# Patient Record
Sex: Male | Born: 1940 | Race: White | Hispanic: No | Marital: Single | Smoking: Former smoker
Health system: Southern US, Community
[De-identification: ages and names within clinical notes are randomized; demographics above are authoritative.]

## PROBLEM LIST (undated history)

## (undated) DIAGNOSIS — M549 Dorsalgia, unspecified: Secondary | ICD-10-CM

## (undated) DIAGNOSIS — M109 Gout, unspecified: Secondary | ICD-10-CM

## (undated) DIAGNOSIS — I4891 Unspecified atrial fibrillation: Secondary | ICD-10-CM

## (undated) DIAGNOSIS — I447 Left bundle-branch block, unspecified: Secondary | ICD-10-CM

## (undated) DIAGNOSIS — N183 Chronic kidney disease, stage 3 (moderate): Secondary | ICD-10-CM

## (undated) DIAGNOSIS — I2510 Atherosclerotic heart disease of native coronary artery without angina pectoris: Secondary | ICD-10-CM

## (undated) DIAGNOSIS — I10 Essential (primary) hypertension: Secondary | ICD-10-CM

## (undated) DIAGNOSIS — R0600 Dyspnea, unspecified: Secondary | ICD-10-CM

## (undated) DIAGNOSIS — S42309A Unspecified fracture of shaft of humerus, unspecified arm, initial encounter for closed fracture: Secondary | ICD-10-CM

## (undated) DIAGNOSIS — G8929 Other chronic pain: Secondary | ICD-10-CM

## (undated) DIAGNOSIS — Z95810 Presence of automatic (implantable) cardiac defibrillator: Secondary | ICD-10-CM

## (undated) DIAGNOSIS — I509 Heart failure, unspecified: Secondary | ICD-10-CM

## (undated) DIAGNOSIS — D696 Thrombocytopenia, unspecified: Secondary | ICD-10-CM

## (undated) DIAGNOSIS — I219 Acute myocardial infarction, unspecified: Secondary | ICD-10-CM

## (undated) DIAGNOSIS — F419 Anxiety disorder, unspecified: Secondary | ICD-10-CM

## (undated) DIAGNOSIS — I5022 Chronic systolic (congestive) heart failure: Secondary | ICD-10-CM

## (undated) HISTORY — DX: Presence of automatic (implantable) cardiac defibrillator: Z95.810

## (undated) HISTORY — DX: Anxiety disorder, unspecified: F41.9

## (undated) HISTORY — DX: Left bundle-branch block, unspecified: I44.7

## (undated) HISTORY — DX: Essential (primary) hypertension: I10

## (undated) HISTORY — DX: Dorsalgia, unspecified: M54.9

## (undated) HISTORY — DX: Chronic kidney disease, stage 3 (moderate): N18.3

## (undated) HISTORY — DX: Other chronic pain: G89.29

## (undated) HISTORY — DX: Unspecified atrial fibrillation: I48.91

## (undated) HISTORY — DX: Thrombocytopenia, unspecified: D69.6

## (undated) HISTORY — DX: Chronic systolic (congestive) heart failure: I50.22

## (undated) HISTORY — DX: Gout, unspecified: M10.9

## (undated) HISTORY — PX: OTHER SURGICAL HISTORY: SHX169

---

## 2006-03-10 ENCOUNTER — Emergency Department: Payer: Self-pay | Admitting: Emergency Medicine

## 2007-09-26 DIAGNOSIS — N183 Chronic kidney disease, stage 3 unspecified: Secondary | ICD-10-CM

## 2007-09-26 HISTORY — DX: Chronic kidney disease, stage 3 unspecified: N18.30

## 2007-12-26 ENCOUNTER — Inpatient Hospital Stay (HOSPITAL_COMMUNITY): Admission: AD | Admit: 2007-12-26 | Discharge: 2007-12-30 | Payer: Self-pay | Admitting: Internal Medicine

## 2007-12-26 ENCOUNTER — Ambulatory Visit: Payer: Self-pay | Admitting: Internal Medicine

## 2008-01-07 ENCOUNTER — Ambulatory Visit: Payer: Self-pay

## 2008-01-26 ENCOUNTER — Emergency Department: Payer: Self-pay | Admitting: Internal Medicine

## 2008-02-27 ENCOUNTER — Encounter: Payer: Self-pay | Admitting: General Practice

## 2008-03-25 ENCOUNTER — Encounter: Payer: Self-pay | Admitting: General Practice

## 2008-04-06 ENCOUNTER — Ambulatory Visit: Payer: Self-pay | Admitting: Internal Medicine

## 2009-03-04 DIAGNOSIS — Z95810 Presence of automatic (implantable) cardiac defibrillator: Secondary | ICD-10-CM | POA: Insufficient documentation

## 2009-03-04 DIAGNOSIS — I447 Left bundle-branch block, unspecified: Secondary | ICD-10-CM | POA: Insufficient documentation

## 2009-03-04 DIAGNOSIS — I5022 Chronic systolic (congestive) heart failure: Secondary | ICD-10-CM | POA: Insufficient documentation

## 2009-03-04 DIAGNOSIS — I4891 Unspecified atrial fibrillation: Secondary | ICD-10-CM

## 2010-06-20 ENCOUNTER — Encounter: Payer: Self-pay | Admitting: Internal Medicine

## 2010-06-28 ENCOUNTER — Ambulatory Visit: Payer: Self-pay | Admitting: Internal Medicine

## 2010-06-28 ENCOUNTER — Encounter: Payer: Self-pay | Admitting: Internal Medicine

## 2010-07-01 ENCOUNTER — Encounter: Payer: Self-pay | Admitting: Internal Medicine

## 2010-09-29 ENCOUNTER — Ambulatory Visit: Admit: 2010-09-29 | Payer: Self-pay | Admitting: Internal Medicine

## 2010-10-04 ENCOUNTER — Encounter (INDEPENDENT_AMBULATORY_CARE_PROVIDER_SITE_OTHER): Payer: Self-pay | Admitting: *Deleted

## 2010-10-25 NOTE — Cardiovascular Report (Signed)
Summary: ICD Follow-Up Summary  ICD Follow-Up Summary   Imported By: Roderic Ovens 07/11/2010 15:08:12  _____________________________________________________________________  External Attachment:    Type:   Image     Comment:   External Document

## 2010-10-25 NOTE — Assessment & Plan Note (Signed)
Summary: ROV/AMD   Visit Type:  Follow-up Primary Provider:  Dr. Fransico Him  CC:  "Denies chest pain or shortness of breath..  History of Present Illness: [name] is seen in followup for a CRT-D implanted in 2009 for congestive heart failure in the setting of ischemic heart disease. He has not been seen since shortly after his device was implanted. He has permanent atrial fibrillation. He is chronically high left ventricular pacing thresholds.  He's had multiple intercurrent medical issues but functionally he had been relatively stable. He denies chest pain. His shortness of breath is stable. He has peripheral edema.  Review of his diet is that he is quite labile and his sodium intake as part of his foods but does not add.      Preventive Screening-Counseling & Management  Alcohol-Tobacco     Smoking Status: quit  Caffeine-Diet-Exercise     Does Patient Exercise: no  Current Medications (verified): 1)  Carvedilol 25 Mg Tabs (Carvedilol) .... Take One Tablet By Mouth Twice A Day 2)  Pravastatin Sodium 80 Mg Tabs (Pravastatin Sodium) .... One Tablet At Bedtime 3)  Clonazepam 1 Mg Tabs (Clonazepam) .... One Tablet Two Times A Day 4)  Furosemide 40 Mg Tabs (Furosemide) .... Take Two Tablet in The A.m. and One Tablet Extra As Directed 5)  Hydroxyzine Hcl 25 Mg Tabs (Hydroxyzine Hcl) .... One Tablet Every 8 Hours As Needed 6)  Lisinopril 40 Mg Tabs (Lisinopril) .... One Tablet Once Daily 7)  Allopurinol 100 Mg Tabs (Allopurinol) .... One Tablet Once Daily 8)  Warfarin Sodium 5 Mg Tabs (Warfarin Sodium) .... Take As Directed 9)  Potassium Cl Er 20 Meq .... 1 To 1 1/2 Tablets Daily With Furosemide 10)  Amlodipine Besylate 5 Mg Tabs (Amlodipine Besylate) .... One Tablet Once Daily 11)  Citalopram Hydrobromide 40 Mg Tabs (Citalopram Hydrobromide) .... One Tablet Once Daily  Allergies (verified): No Known Drug Allergies  Past History:  Past Medical History: Last updated:  03/04/2009 ICD - IN SITU (ICD-V45.02) SYSTOLIC HEART FAILURE, CHRONIC (ICD-428.22) LBBB (ICD-426.3) ATRIAL FIBRILLATION (ICD-427.31) RENAL INSUFFICIENCY (ICD-588.9)    Past Surgical History: Last updated: 03/04/2009 ICD -  St. Jude Durata - 12/27/07 - CHF, Afib, LBBB, 3 vessel disease  Social History: Last updated: 06/28/2010 Retired  Single  Tobacco Use - Former. Quit 15 years ago. Alcohol Use - yes Regular Exercise - no  Risk Factors: Exercise: no (06/28/2010)  Risk Factors: Smoking Status: quit (06/28/2010)  Social History: Retired  Single  Tobacco Use - Former. Quit 15 years ago. Alcohol Use - yes Regular Exercise - no Smoking Status:  quit Does Patient Exercise:  no  Vital Signs:  Patient profile:   70 year old male Height:      70 inches Weight:      262 pounds BMI:     37.73 Pulse rate:   71 / minute BP sitting:   118 / 79  (right arm) Cuff size:   large  Vitals Entered By: Bishop Dublin, CMA (June 28, 2010 11:39 AM)  Physical Exam  General:  The patient was alert and oriented in no acute distress. HEENT Normal with ptosis and blinking of his right eye  Neck veins were flat, carotids were brisk.  Lungs were clear.  Heart sounds were regular without murmurs or gallops.  Abdomen was soft protuberant with active bowel sounds. There is no clubbing cyanosis 2+ edema Skin Warm and dry     ICD Specifications Following MD:  Sherryl Manges,  MD     ICD Vendor:  St Jude     ICD Model Number:  [id number]     ICD Serial Number:  [id number] ICD DOI:  12/27/2007     ICD Implanting MD:  Sherryl Manges, MD  Lead 1:    Location: RA     DOI: 12/27/2007     Model #: [id number]     Serial #: [id number]     Status: active Lead 2:    Location: RV     DOI: 12/27/2007     Model #: 1914     Serial #: [id number]     Status: active Lead 3:    Location: LV     DOI: 12/27/2007     Model #: 3086     Serial #: [id number]     Status: active  Indications::  CM   Impression &  Recommendations:  Problem # 1:  ICD - IN SITU (ICD-V45.02) Device parameters and data were reviewed and no changes were made  Problem # 2:  SYSTOLIC HEART FAILURE, CHRONIC (ICD-428.22) stable on current medications; he has significant peripheral edema. I suggested that we stop his amlodipine. Apparently however, his nephrologist has told him that amlodipine is being used for "renal protection" I will have to learn something about that. For now we'll continue it. He is advised to decrease his sodium intake. His updated medication list for this problem includes:    Carvedilol 25 Mg Tabs (Carvedilol) .Marland Kitchen... Take one tablet by mouth twice a day    Furosemide 40 Mg Tabs (Furosemide) .Marland Kitchen... Take two tablet in the a.m. and one tablet extra as directed    Lisinopril 40 Mg Tabs (Lisinopril) ..... One tablet once daily    Warfarin Sodium 5 Mg Tabs (Warfarin sodium) .Marland Kitchen... Take as directed    Amlodipine Besylate 5 Mg Tabs (Amlodipine besylate) ..... One tablet once daily  Problem # 3:  LBBB (ICD-426.3) stable His updated medication list for this problem includes:    Carvedilol 25 Mg Tabs (Carvedilol) .Marland Kitchen... Take one tablet by mouth twice a day    Lisinopril 40 Mg Tabs (Lisinopril) ..... One tablet once daily    Warfarin Sodium 5 Mg Tabs (Warfarin sodium) .Marland Kitchen... Take as directed    Amlodipine Besylate 5 Mg Tabs (Amlodipine besylate) ..... One tablet once daily  Problem # 4:  ATRIAL FIBRILLATION (ICD-427.31) permanent on Coumadin His updated medication list for this problem includes:    Carvedilol 25 Mg Tabs (Carvedilol) .Marland Kitchen... Take one tablet by mouth twice a day    Warfarin Sodium 5 Mg Tabs (Warfarin sodium) .Marland Kitchen... Take as directed

## 2010-10-25 NOTE — Miscellaneous (Signed)
Summary: Device preload  Clinical Lists Changes  Observations: Added new observation of ICD INDICATN: CM (06/20/2010 15:09) Added new observation of ICDLEADSTAT3: active (06/20/2010 15:09) Added new observation of ICDLEADSER3: [id number] V (06/20/2010 15:09) Added new observation of ICDLEADMOD3: 4193  (06/20/2010 15:09) Added new observation of ICDLEADDOI3: 12/27/2007  (06/20/2010 15:09) Added new observation of ICDLEADLOC3: LV  (06/20/2010 15:09) Added new observation of ICDLEADSTAT2: active  (06/20/2010 15:09) Added new observation of ICDLEADSER2: [id number]  (06/20/2010 15:09) Added new observation of ICDLEADMOD2: 7121  (06/20/2010 15:09) Added new observation of ICDLEADDOI2: 12/27/2007  (06/20/2010 15:09) Added new observation of ICDLEADLOC2: RV  (06/20/2010 15:09) Added new observation of ICDLEADSTAT1: active  (06/20/2010 15:09) Added new observation of ICDLEADSER1: [id number]  (06/20/2010 15:09) Added new observation of ICDLEADMOD1: 1688TC  (06/20/2010 15:09) Added new observation of ICDLEADDOI1: 12/27/2007  (06/20/2010 15:09) Added new observation of ICDLEADLOC1: RA  (06/20/2010 15:09) Added new observation of ICD IMP MD: Sherryl Manges, MD  (06/20/2010 15:09) Added new observation of ICD IMPL DTE: 12/27/2007  (06/20/2010 15:09) Added new observation of ICD SERL#: [id number]  (06/20/2010 15:09) Added new observation of ICD MODL#: [id number]  (06/20/2010 29:52) Added new observation of ICDMANUFACTR: St Jude  (06/20/2010 15:09) Added new observation of ICD MD: Sherryl Manges, MD  (06/20/2010 15:09)       ICD Specifications Following MD:  Sherryl Manges, MD     ICD Vendor:  St Jude     ICD Model Number:  [id number]     ICD Serial Number:  [id number] ICD DOI:  12/27/2007     ICD Implanting MD:  Sherryl Manges, MD  Lead 1:    Location: RA     DOI: 12/27/2007     Model #: 1688TC     Serial #: [id number]     Status: active Lead 2:    Location: RV     DOI: 12/27/2007     Model #: 3474     Serial #: [id number]      Status: active Lead 3:    Location: LV     DOI: 12/27/2007     Model #: 5643     Serial #: [id number]     Status: active  Indications::  CM

## 2010-10-25 NOTE — Procedures (Signed)
Summary: Cardiology Device Clinic   Allergies: No Known Drug Allergies   ICD Specifications Following MD:  Sherryl Manges, MD     ICD Vendor:  St Jude     ICD Model Number:  [phone]     ICD Serial Number:  [id number] ICD DOI:  12/27/2007     ICD Implanting MD:  Sherryl Manges, MD  Lead 1:    Location: RA     DOI: 12/27/2007     Model #: 1688TC     Serial #: [id number]     Status: active Lead 2:    Location: RV     DOI: 12/27/2007     Model #: 9371     Serial #: [id number]     Status: active Lead 3:    Location: LV     DOI: 12/27/2007     Model #: 1017     Serial #: [id number]     Status: active  Indications::  CM   ICD Follow Up Remote Check?  No Battery Voltage:  2.57 V     Charge Time:  14.4 seconds     Underlying rhythm:  A-fib   ICD Device Measurements Atrium:  Amplitude: 3.4 mV, Impedance: 630 ohms,  Right Ventricle:  Amplitude: 11.7 mV, Impedance: 460 ohms, Threshold: 1.0 V at 0.5 msec Left Ventricle:  Impedance: 890 ohms, Threshold: 5.0 V at 1.5 msec Shock Impedance: 48 ohms   Episodes Coumadin:  Yes Atrial Pacing:  <1%     Ventricular Pacing:  90%  Brady Parameters Mode DDDR     Lower Rate Limit:  60     Upper Rate Limit 120 PAV 180     Sensed AV Delay:  150  Tachy Zones VF:  240     VT:  210     VT1:  176     Next Remote Date:  09/29/2010     Next Cardiology Appt Due:  06/26/2011 Tech Comments:  A-fib, + coumadin.  LV reprogrammed 6.0@1 .0.  Merlin transmissions every 3 months. ROV 1 year with Dr. Graciela Husbands in [place].  Checked by [employer]. Altha Harm, LPN  July 01, 2010 11:04 AM

## 2010-10-27 NOTE — Letter (Signed)
Summary: Device-Delinquent Phone Journalist, newspaper, Main Office  [address]   [place], [place] [address]   Phone: [phone]  Fax: [phone]     October 04, 2010 MRN: [id number]   [facility] [address] [place], [place]  [address]   Dear Mr. Debarge,  According to our records, you were scheduled for a device phone transmission on 09-29-2010.     We did not receive any results from this check.  If you transmitted on your scheduled day, please call us to help troubleshoot your system.  If you forgot to send your transmission, please send one upon receipt of this letter.  Thank you,   Architectural technologist Device Clinic

## 2010-12-01 ENCOUNTER — Encounter (INDEPENDENT_AMBULATORY_CARE_PROVIDER_SITE_OTHER): Payer: Self-pay | Admitting: *Deleted

## 2010-12-06 NOTE — Letter (Signed)
Summary: Device-Delinquent Check  [facility], Main Office  [address]   [place], [place] [address]   Phone: [phone]  Fax: [phone]     December 01, 2010 MRN: [id number]   [facility] [address] [place], [place]  [address]   Dear Micheal [name],  According to our records, you have not had your implanted device checked in the recommended period of time.  We are unable to determine appropriate device function without checking your device on a regular basis.  Please call our office to schedule an appointment as soon as possible.  If you are having your device checked by another physician, please call us so that we may update our records.  Thank you,  Letta Moynahan, EMT  December 01, 2010 2:13 PM  [facility] Device [facility]

## 2011-02-07 NOTE — Letter (Signed)
April 06, 2008    Lamar Blinks, M.D.  [facility] (Main)  [address]  [place], [place] [address]   RE:  [name], Micheal  MRN:  [id number]  /  DOB:  [birth year]   Dear Smitty Cords:   Mr. Bunyard comes in today with his daughter.  His breathing is much  better since you considered referring for CRT-D implantation that was  successfully accomplished.   He has had some problems since he got pulled over by his dog and  dislocated his shoulder, but he is gradually getting better in this  regard as well.   MEDICATIONS:  1. Coreg 25 b.i.d.  2. Lisinopril 20.  3. Furosemide 80.  4. Aspirin.  5. Potassium.  6. Coumadin.  7. Celexa.  8. Metformin 500.   PHYSICAL EXAMINATION:  VITAL SIGNS:  Today on examination, his blood  pressure 136/78, his pulse 85.  His weight was 226.  NECK:  His neck veins were flat.  LUNGS:  Clear.  HEART:  Sounds were regular.  EXTREMITIES:  Had 1+ edema.   Interrogation of his St. Jude ICD demonstrates that he is in atrial  fibrillation with an R-wave of 11.4, impedance of 480 and threshold  point 0.5 at 0.5.  The LV impedance of 730, the threshold of 5 at 1.  He  is 98% ventricularly paced.   IMPRESSION:  1. Coronary artery disease.      a.     Depressed left ventricular function.      b.     Inoperable three-vessel disease.  2. Chronic systolic heart failure - improved.  3. Status post CRT-D for the above and contributing.  4. High left ventricular threshold - stable.  5. Atrial fibrillation - permanent.   Mr. Kipp, Shank is doing really very well.  We will plan to keep him  on his current medications.  He will see you again as previously  scheduled and we will see him in 3 months at which time we will decide  whether to put him on a remote followup or not.    Sincerely,      Duke Salvia, MD, [facility]  Electronically Signed    SCK/MedQ  DD: 04/06/2008  DT: 04/07/2008  Job #: [id number]

## 2011-02-07 NOTE — Discharge Summary (Signed)
Micheal [name], Micheal [name]               ACCOUNT NO.:  [id number]   MEDICAL RECORD NO.:  [id number]          PATIENT TYPE:  INP   LOCATION:  3707                         FACILITY:  [facility]   PHYSICIAN:  Maple Mirza, PA   DATE OF BIRTH:  05/11/1941   DATE OF ADMISSION:  12/26/2007  DATE OF DISCHARGE:                               DISCHARGE SUMMARY   ALLERGIES:  NO KNOWN DRUG ALLERGIES.   DICTATION AND EXAMINATION TIME:  Greater than 40 minutes.   FINAL DIAGNOSES:  1. Discharging day three status post implant of a St. Jude Promote      cardioverter-defibrillator with left ventricular cardiac      resynchronization lead.  2. Admitted [facility] with progressive dyspnea.  3. Left heart catheterization December 26, 2007, with ejection fraction      15% Dr. Gwen Pounds.  4. Atrial fibrillation/Coumadin therapy, new diagnosis this admission.  5. New York Heart Association classroom  6. Chronic systolic congestive heart failure symptoms.  7. Left bundle branch block.  8. Renal insufficiency with a zenith creatinine of 2.06 on April 5      after aggressive diuretic push, discharging with creatinine 1.76 on      April 6.  9. Weight lost this admission, 11 pounds.  Admission weight 239.      Discharge weight to 228.   SECONDARY DIAGNOSES:  1. Left heart catheterization December 26, 2007 ejection fraction 15%.      Three-vessel coronary artery disease.  The LAD has a poor 100%      proximal stenosis.  The first diagonal has 80% stenosis of the      first obtuse marginal has diffuse 75% stenosis.  The second obtuse      marginal diffuse 90% stenosis.  The PDA is 100% occluded.  The mid      right coronary artery only 30% occluded at that procedure PCI was      not attempted, and the patient is a very possible high-risk patient      for coronary artery bypass graft surgery.  2. Hypertension.  3. 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  4. Anxiety/depression.   BRIEF HISTORY:   Micheal [name] is a 70 year old male who is transferred to  [facility] on April 2 from [facility].  He was seen in  the hospital at [facility] by Dr. Sherryl Manges.  The patient had left  heart catheterization that same day which showed ejection fraction 15%  with three-vessel coronary artery disease, not allowing PCI and a high  risk for coronary artery bypass graft revascularization.  The patient  appeared to have atrial fibrillation with rapid ventricular rate of  uncertain duration and new diagnosis.  This has impacted the patient  with chronic systolic New York Heart Association III congestive heart  failure.  The patient also has on electrocardiogram left bundle branch  block his cardiac risk equivalents are diabetes, risk factors are  hypertension and dyslipidemia.   The patient says that for several months he has had difficulty breathing  especially with exertion.  He also notes that he has had  problems with  volume control.  At One point, achieving weight of 278 and then  diuresing down to 240.  On admission, his weight is about 238 pounds.  The plan would be to admit the patient and transfer to [facility].  The patient will require a cardioverter-defibrillator with  cardiac resynchronization lead.  He will also begin Coumadin after the  procedure.  He will have a fasting lipid profile.  His Coreg will be  increased for a blood pressure which will easily tolerate it.  Renal  function will be monitored carefully since on admission is 1.74,  creatinine 1.74.   HOSPITAL COURSE:  Patient is admitted to [facility] in transfer  from [facility].  The patient was diaphoretic, extremely tense  and nervous.  He is also on oxygen with some difficulty breathing.  Examination shows the patient has marked bibasilar crackles.  He is in  atrial fibrillation.  At the time of his admission, the rate is  controlled.  The same day he underwent implantation of a  St. Jude  Promote RF [id number] cardioverter-defibrillator.  Cardiac  resynchronization lead was successfully placed as well by Dr. Sherryl Manges.  Coumadin therapy was started the evening of April 3.  The  patient has required moderately high doses of Coumadin 10 mg daily to  achieve at discharge after three doses of an INR 1.7.  As mentioned  above, creatinine has been followed carefully, at one point his Lasix  was aggressively increased to 80 mg IV q.12 h and lisinopril was  increased at 20 mg twice daily.  As a result of that, his creatinine  crested from 1.73-2.06.  The patient has actually successfully diuresed  11 pounds during the 4 days here at [facility].  Lasix was then  decreased to 80 mg daily, lisinopril to 10 mg daily with a decrease in  creatinine to 1.76 which is close to his admission creatinine.  Patient  discharging April 6.  He has had education.  He is asked to keep his  incision dry for the next 4 days and to sponge bathe until Friday April  10.  He is asked not to drive until April 10, and he is asked to keep  the left arm quiet until Friday April 10.   DISCHARGE MEDICATIONS:  1. Coreg 25 mg twice daily.  This is a new dose.  2. Lisinopril 10 mg daily.  This is a new dose.  3. Furosemide 80 mg daily.  A new higher dose.  4. Potassium chloride 20 mEq twice daily.  A new dose.  5. Enteric-coated aspirin 325 mg daily.  This is a new.  6. Coumadin 5 mg tablets.  He is to take 2 tablets Monday April 6 and      then one and one half tabs Tuesday and Wednesday April 7 at 8:00      and then as directed by Dr. Philemon Kingdom office.  7. Celexa 40 mg daily.  8. Clonazepam 0.5 mg twice daily.  9. Pravastatin 40 mg daily at bedtime.  10.Metformin 500 mg daily.  11.The patient is to stop amlodipine and to stop digoxin.   LABORATORY STUDIES:  This admission, on the day of discharge, his  protime was 20.1, INR 1.7.  His hemoglobin A1c this admission was 5.8.  Basic  metabolic panel day of discharge:  Sodium 138, potassium 3.6,  chloride 101, carbonate 29, BUN is 31, creatinine 1.76, glucose 103.  Complete  blood count this admission:  Hemoglobin 14.6, hematocrit 43.9,  white cells 8.6 and platelets 171.  His cholesterol fasting lipid  profile, cholesterol 103, triglycerides 78, HDL cholesterol 25, LDL  cholesterol 62.  The BNP on admission April 3 is 1537.      Maple Mirza, PA    GM/MEDQ  D:  12/30/2007  T:  12/30/2007  Job:  [id number]   cc:   Duke Salvia, MD, [facility]  Trish Fountain, MD  [place] Dr. Fransico Him, [facility]

## 2011-02-07 NOTE — Op Note (Signed)
NAMEKENT, RIENDEAU               ACCOUNT NO.:  [id number]   MEDICAL RECORD NO.:  [id number]          PATIENT TYPE:  INP   LOCATION:  3707                         FACILITY:  [facility]   PHYSICIAN:  Duke Salvia, MD, FACCDATE OF BIRTH:  Mar 07, 1941   DATE OF PROCEDURE:  12/27/2007  DATE OF DISCHARGE:                               OPERATIVE REPORT   PREOPERATIVE DIAGNOSES:  Congestive heart failure, atrial fibrillation,  left bundle branch block, and inoperable three-vessel disease.   POSTOPERATIVE DIAGNOSES:  Congestive heart failure, atrial fibrillation,  left bundle branch block, and inoperable three-vessel disease.   PROCEDURE:  Dual-chamber defibrillator implantation, high-voltage lead  testing, and left ventricular lead placement.   Following obtaining informed consent, the patient was brought to the  electrophysiology laboratory and placed on the fluoroscopic table in the  supine position.  After prep and drape of the left upper chest,  lidocaine was infiltrated into the prepectoral subclavicular region.  An  incision was made and carried down to the layer of the prepectoral  fascia using electrocautery and sharp dissection.  The pocket was formed  similarly.  Hemostasis was obtained.   Thereafter attention was turned to gaining access to the extrathoracic  left subclavian vein, which was accomplished without difficulty and  without the aspiration of air or puncture of the artery.  Three separate  venipunctures were accomplished.  Guidewires were placed and retained.   Initially an 8-French sheath was placed, through which was passed a St.  Jude Durata pulse generator serial number [id number], 65-cm lead model  7121, which was manipulated under fluoroscopic guidance to the right  ventricular apex, where bipolar R wave was 12.5 with a paced impedance  of 840, a threshold of 1.0 volts at 0.5 milliseconds and current  threshold was 1.3 MA.  The current of injury was brisk and  there is no  diaphragmatic pacing at 10 volts.  The lead was secured to the  prepectoral fascia.   Then attention was turned to gaining access to the coronary sinus using  a MB2 coronary sinus cannulation catheter which turned out to be  surprisingly easy but ineffective in that was too small as was  anticipated to be the case from the huge size of the patient's right  ventricle.  After we cannulated the coronary sinus and took a venogram,  I identified a mid to high lateral branch that coursed laterally and  posteriorly that was associated with prolapsing of the sheath.  It was  elected to replace the coronary sinus sheath and a Wholey wire while  left in place was then was used to maintain access.  The MB2 was removed  and a multi-purpose Medtronic sheath over an Attain straight dilator was  used to re-secure access of the coronary sinus.  Repeat venograms were  obtained.  The identified vein had a 135 or so degree takeoff.  It then  coursed about 2 cm and then moved apically in a rather flat direction.  It was elected to use an Attain 2 system as stability of the sheath was  felt to be essential.  We initially tried a 90 and then the 135 degree  and obtained 2 sheaths and ultimately were able to pass the wire to the  RV apex.  All this took a good deal of time and effort.  Following the  lead placement, we then took a Medtronic 4193 88-cm lead, serial number  [id number], and manipulated it into this vein.  I had tried to deep seat  the Attain 2 sheath first over the dilator; however, this was  unsuccessful, so we took our chances with the lead.  We almost lost  everything but then the lead moved across the orifice of the vein and  down into the body of the vein.   Unfortunately, we then encountered the next difficulty, which was that  there was very poor capture threshold availability along the course of  this vein in any position where we could secure the lead.  This was not  a  surprise given the patient's severe ischemic cardiomyopathy; however,  we finally found a place where with manipulation, we had an R wave of 8  with impedance of 506 and a threshold of 3.1 volts at 1.0 milliseconds.  There was no diaphragmatic pacing at 10 volts and the current at  threshold was 7.0 MA.  This lead was left in place.  The delivery  systems were left in place.  The 9.5-French sheath was removed and over  the last retained guide wire was placed another 7-French sheath, which  was passed a St. Jude [id number] active fixation atrial lead, serial number  [id number].  Under fluoroscopic guidance it was manipulated into the right  atrial appendage where the bipolar fibrillation wave averaged to about  1.4 mV ranging from 0.62 with impedance of 521 ohms.  This lead was  secured to the prepectoral fascia and then we turned back to the CS  delivery system.   Here we encountered our next difficulty.  Initial attempts to remove the  Attain 2 sheath were frustrated by the fracture of the pulling tab.  We  then needed to slit the sheath a little bit and re-slit and repeatedly  ended up having to do this and unfortunately the lead moved apically  with this.  Following removal of this sheath, we then worked on  repositioning the lead and got the aforementioned values after  repositioning it on a couple of  occasions.   We then removed the multi-purpose sheath, without further migration of  the lead, and confirmation of the parameters as above.   This lead was then secured to the prepectoral fascia.  The wound was  copiously irrigated with antibiotic-containing saline solution.  Hemostasis was assured.  The leads and were then attached to a St. Jude  Promote RF pulse generator, model [id number], serial number [id number].  Through  the device the bipolar fibrillation wave was 0.5-2.7 with an impedance  of 500 ohms.  The R wave was 11 with a paced impedance of 690 ohms, a  threshold of 0.5 at 0.5 and the  LV impedance was 630 with a threshold of  4 volts at 1.  The high-voltage impedance was 45 ohms.   At this point the device was implanted.  Because of the patient's  inoperable coronary disease, no further high-voltage testing was  accomplished other than what was noted previously.  The wound was closed  in 3 layers in normal fashion.  The wound was washed, dried and then a  benzoin Steri-Strip dressing was applied.  I should also note that the patient's central venous pressures were  measured and were approximately 25 cm of water and at the end of the  case Lasix was given intravenously.   The patient will be observed probably for 48-72 hours while we work on  diuresis until the point that his creatinine starts to bump.      Duke Salvia, MD, [facility]  Electronically Signed     SCK/MEDQ  D:  12/27/2007  T:  12/27/2007  Job:  [id number]   cc:   [facility]  [facility]  Electrophysiology Laboratory  [address]

## 2011-02-16 ENCOUNTER — Inpatient Hospital Stay: Payer: Self-pay | Admitting: *Deleted

## 2011-03-13 ENCOUNTER — Encounter: Payer: Self-pay | Admitting: Cardiovascular Disease

## 2011-05-09 ENCOUNTER — Encounter: Payer: Self-pay | Admitting: *Deleted

## 2011-06-20 LAB — BASIC METABOLIC PANEL
BUN: 18
BUN: 31 — ABNORMAL HIGH
CO2: 33 — ABNORMAL HIGH
Calcium: 9
Calcium: 9.2
Calcium: 9.3
Calcium: 9.5
Chloride: 101
Creatinine, Ser: 1.74 — ABNORMAL HIGH
Creatinine, Ser: 1.76 — ABNORMAL HIGH
GFR calc Af Amer: 39 — ABNORMAL LOW
GFR calc Af Amer: 47 — ABNORMAL LOW
GFR calc non Af Amer: 39 — ABNORMAL LOW
GFR calc non Af Amer: 40 — ABNORMAL LOW
GFR calc non Af Amer: 40 — ABNORMAL LOW
Potassium: 3.3 — ABNORMAL LOW
Potassium: 3.8
Sodium: 138
Sodium: 139

## 2011-06-20 LAB — PROTIME-INR
INR: 1.1
INR: 1.2
INR: 1.4
INR: 1.7 — ABNORMAL HIGH
Prothrombin Time: 17.1 — ABNORMAL HIGH
Prothrombin Time: 20.1 — ABNORMAL HIGH

## 2011-06-20 LAB — CBC
HCT: 50
Hemoglobin: 16.6
Platelets: 163
Platelets: 171
WBC: 12.9 — ABNORMAL HIGH
WBC: 8.6

## 2011-06-20 LAB — LIPID PANEL: Cholesterol: 103

## 2011-06-20 LAB — HEMOGLOBIN A1C
Hgb A1c MFr Bld: 5.8
Mean Plasma Glucose: 129

## 2011-06-20 LAB — B-NATRIURETIC PEPTIDE (CONVERTED LAB): Pro B Natriuretic peptide (BNP): 1537 — ABNORMAL HIGH

## 2011-06-20 LAB — APTT: aPTT: 27

## 2011-10-17 DIAGNOSIS — Z7901 Long term (current) use of anticoagulants: Secondary | ICD-10-CM | POA: Diagnosis not present

## 2011-10-26 DIAGNOSIS — I519 Heart disease, unspecified: Secondary | ICD-10-CM | POA: Diagnosis not present

## 2011-10-26 DIAGNOSIS — I509 Heart failure, unspecified: Secondary | ICD-10-CM | POA: Diagnosis not present

## 2011-10-26 DIAGNOSIS — I2510 Atherosclerotic heart disease of native coronary artery without angina pectoris: Secondary | ICD-10-CM | POA: Diagnosis not present

## 2011-10-26 DIAGNOSIS — E782 Mixed hyperlipidemia: Secondary | ICD-10-CM | POA: Diagnosis not present

## 2011-10-26 DIAGNOSIS — I5022 Chronic systolic (congestive) heart failure: Secondary | ICD-10-CM | POA: Diagnosis not present

## 2011-10-26 DIAGNOSIS — I110 Hypertensive heart disease with heart failure: Secondary | ICD-10-CM | POA: Diagnosis not present

## 2011-10-27 DIAGNOSIS — T82198A Other mechanical complication of other cardiac electronic device, initial encounter: Secondary | ICD-10-CM | POA: Diagnosis not present

## 2011-10-27 DIAGNOSIS — I5022 Chronic systolic (congestive) heart failure: Secondary | ICD-10-CM | POA: Diagnosis not present

## 2011-10-27 DIAGNOSIS — I4891 Unspecified atrial fibrillation: Secondary | ICD-10-CM | POA: Diagnosis not present

## 2011-10-27 DIAGNOSIS — Z95810 Presence of automatic (implantable) cardiac defibrillator: Secondary | ICD-10-CM | POA: Diagnosis not present

## 2011-10-27 DIAGNOSIS — I447 Left bundle-branch block, unspecified: Secondary | ICD-10-CM | POA: Diagnosis not present

## 2011-11-08 DIAGNOSIS — I447 Left bundle-branch block, unspecified: Secondary | ICD-10-CM | POA: Diagnosis not present

## 2011-11-08 DIAGNOSIS — I509 Heart failure, unspecified: Secondary | ICD-10-CM | POA: Diagnosis not present

## 2011-11-08 DIAGNOSIS — Z01818 Encounter for other preprocedural examination: Secondary | ICD-10-CM | POA: Diagnosis not present

## 2011-11-08 DIAGNOSIS — Z79899 Other long term (current) drug therapy: Secondary | ICD-10-CM | POA: Diagnosis not present

## 2011-11-08 DIAGNOSIS — T82198A Other mechanical complication of other cardiac electronic device, initial encounter: Secondary | ICD-10-CM | POA: Diagnosis not present

## 2011-11-08 DIAGNOSIS — Z95810 Presence of automatic (implantable) cardiac defibrillator: Secondary | ICD-10-CM | POA: Diagnosis not present

## 2011-11-08 DIAGNOSIS — I5022 Chronic systolic (congestive) heart failure: Secondary | ICD-10-CM | POA: Diagnosis not present

## 2011-11-08 DIAGNOSIS — T82599A Other mechanical complication of unspecified cardiac and vascular devices and implants, initial encounter: Secondary | ICD-10-CM | POA: Diagnosis not present

## 2011-11-08 DIAGNOSIS — I4891 Unspecified atrial fibrillation: Secondary | ICD-10-CM | POA: Diagnosis not present

## 2011-11-09 DIAGNOSIS — Z6834 Body mass index (BMI) 34.0-34.9, adult: Secondary | ICD-10-CM | POA: Diagnosis not present

## 2011-11-09 DIAGNOSIS — I428 Other cardiomyopathies: Secondary | ICD-10-CM | POA: Diagnosis not present

## 2011-11-09 DIAGNOSIS — J90 Pleural effusion, not elsewhere classified: Secondary | ICD-10-CM | POA: Diagnosis not present

## 2011-11-09 DIAGNOSIS — E669 Obesity, unspecified: Secondary | ICD-10-CM | POA: Diagnosis not present

## 2011-11-09 DIAGNOSIS — Z87891 Personal history of nicotine dependence: Secondary | ICD-10-CM | POA: Diagnosis not present

## 2011-11-09 DIAGNOSIS — I447 Left bundle-branch block, unspecified: Secondary | ICD-10-CM | POA: Diagnosis not present

## 2011-11-09 DIAGNOSIS — Z79899 Other long term (current) drug therapy: Secondary | ICD-10-CM | POA: Diagnosis not present

## 2011-11-09 DIAGNOSIS — I509 Heart failure, unspecified: Secondary | ICD-10-CM | POA: Diagnosis not present

## 2011-11-09 DIAGNOSIS — M109 Gout, unspecified: Secondary | ICD-10-CM | POA: Diagnosis not present

## 2011-11-09 DIAGNOSIS — Z7901 Long term (current) use of anticoagulants: Secondary | ICD-10-CM | POA: Diagnosis not present

## 2011-11-09 DIAGNOSIS — I501 Left ventricular failure: Secondary | ICD-10-CM | POA: Diagnosis not present

## 2011-11-09 DIAGNOSIS — I4891 Unspecified atrial fibrillation: Secondary | ICD-10-CM | POA: Diagnosis not present

## 2011-11-09 DIAGNOSIS — I10 Essential (primary) hypertension: Secondary | ICD-10-CM | POA: Diagnosis not present

## 2011-11-09 DIAGNOSIS — E785 Hyperlipidemia, unspecified: Secondary | ICD-10-CM | POA: Diagnosis not present

## 2011-11-09 DIAGNOSIS — Z4502 Encounter for adjustment and management of automatic implantable cardiac defibrillator: Secondary | ICD-10-CM | POA: Diagnosis not present

## 2011-11-09 DIAGNOSIS — I5023 Acute on chronic systolic (congestive) heart failure: Secondary | ICD-10-CM | POA: Diagnosis not present

## 2011-11-10 DIAGNOSIS — I428 Other cardiomyopathies: Secondary | ICD-10-CM | POA: Diagnosis not present

## 2011-11-10 DIAGNOSIS — I10 Essential (primary) hypertension: Secondary | ICD-10-CM | POA: Diagnosis not present

## 2011-11-10 DIAGNOSIS — I4891 Unspecified atrial fibrillation: Secondary | ICD-10-CM | POA: Diagnosis not present

## 2011-11-10 DIAGNOSIS — Z4502 Encounter for adjustment and management of automatic implantable cardiac defibrillator: Secondary | ICD-10-CM | POA: Diagnosis not present

## 2011-11-10 DIAGNOSIS — I447 Left bundle-branch block, unspecified: Secondary | ICD-10-CM | POA: Diagnosis not present

## 2011-11-10 DIAGNOSIS — E785 Hyperlipidemia, unspecified: Secondary | ICD-10-CM | POA: Diagnosis not present

## 2011-11-10 DIAGNOSIS — I509 Heart failure, unspecified: Secondary | ICD-10-CM | POA: Diagnosis not present

## 2011-11-10 DIAGNOSIS — J90 Pleural effusion, not elsewhere classified: Secondary | ICD-10-CM | POA: Diagnosis not present

## 2011-11-10 DIAGNOSIS — Z95810 Presence of automatic (implantable) cardiac defibrillator: Secondary | ICD-10-CM | POA: Diagnosis not present

## 2011-11-10 DIAGNOSIS — I517 Cardiomegaly: Secondary | ICD-10-CM | POA: Diagnosis not present

## 2011-11-10 DIAGNOSIS — I5023 Acute on chronic systolic (congestive) heart failure: Secondary | ICD-10-CM | POA: Diagnosis not present

## 2011-11-10 DIAGNOSIS — I501 Left ventricular failure: Secondary | ICD-10-CM | POA: Diagnosis not present

## 2011-11-14 DIAGNOSIS — Z7901 Long term (current) use of anticoagulants: Secondary | ICD-10-CM | POA: Diagnosis not present

## 2011-11-14 DIAGNOSIS — I4891 Unspecified atrial fibrillation: Secondary | ICD-10-CM | POA: Diagnosis not present

## 2011-11-21 DIAGNOSIS — Z7901 Long term (current) use of anticoagulants: Secondary | ICD-10-CM | POA: Diagnosis not present

## 2011-11-21 DIAGNOSIS — I4891 Unspecified atrial fibrillation: Secondary | ICD-10-CM | POA: Diagnosis not present

## 2011-12-14 DIAGNOSIS — M109 Gout, unspecified: Secondary | ICD-10-CM | POA: Diagnosis not present

## 2011-12-14 DIAGNOSIS — Z7901 Long term (current) use of anticoagulants: Secondary | ICD-10-CM | POA: Diagnosis not present

## 2011-12-14 DIAGNOSIS — I4891 Unspecified atrial fibrillation: Secondary | ICD-10-CM | POA: Diagnosis not present

## 2011-12-19 DIAGNOSIS — I110 Hypertensive heart disease with heart failure: Secondary | ICD-10-CM | POA: Diagnosis not present

## 2011-12-19 DIAGNOSIS — E782 Mixed hyperlipidemia: Secondary | ICD-10-CM | POA: Diagnosis not present

## 2011-12-19 DIAGNOSIS — I509 Heart failure, unspecified: Secondary | ICD-10-CM | POA: Diagnosis not present

## 2011-12-19 DIAGNOSIS — I5022 Chronic systolic (congestive) heart failure: Secondary | ICD-10-CM | POA: Diagnosis not present

## 2011-12-26 DIAGNOSIS — E7800 Pure hypercholesterolemia, unspecified: Secondary | ICD-10-CM | POA: Diagnosis not present

## 2011-12-26 DIAGNOSIS — N183 Chronic kidney disease, stage 3 unspecified: Secondary | ICD-10-CM | POA: Diagnosis not present

## 2011-12-26 DIAGNOSIS — M109 Gout, unspecified: Secondary | ICD-10-CM | POA: Diagnosis not present

## 2011-12-26 DIAGNOSIS — G894 Chronic pain syndrome: Secondary | ICD-10-CM | POA: Diagnosis not present

## 2011-12-26 DIAGNOSIS — Z7901 Long term (current) use of anticoagulants: Secondary | ICD-10-CM | POA: Diagnosis not present

## 2011-12-26 DIAGNOSIS — I4891 Unspecified atrial fibrillation: Secondary | ICD-10-CM | POA: Diagnosis not present

## 2011-12-28 DIAGNOSIS — I4891 Unspecified atrial fibrillation: Secondary | ICD-10-CM | POA: Diagnosis not present

## 2011-12-29 DIAGNOSIS — N183 Chronic kidney disease, stage 3 unspecified: Secondary | ICD-10-CM | POA: Diagnosis not present

## 2011-12-29 DIAGNOSIS — M109 Gout, unspecified: Secondary | ICD-10-CM | POA: Diagnosis not present

## 2011-12-29 DIAGNOSIS — I10 Essential (primary) hypertension: Secondary | ICD-10-CM | POA: Diagnosis not present

## 2011-12-29 DIAGNOSIS — N2581 Secondary hyperparathyroidism of renal origin: Secondary | ICD-10-CM | POA: Diagnosis not present

## 2012-01-15 ENCOUNTER — Inpatient Hospital Stay: Payer: Self-pay | Admitting: Internal Medicine

## 2012-01-15 DIAGNOSIS — M25579 Pain in unspecified ankle and joints of unspecified foot: Secondary | ICD-10-CM | POA: Diagnosis not present

## 2012-01-15 DIAGNOSIS — E875 Hyperkalemia: Secondary | ICD-10-CM | POA: Diagnosis present

## 2012-01-15 DIAGNOSIS — I959 Hypotension, unspecified: Secondary | ICD-10-CM | POA: Diagnosis not present

## 2012-01-15 DIAGNOSIS — S0990XA Unspecified injury of head, initial encounter: Secondary | ICD-10-CM | POA: Diagnosis not present

## 2012-01-15 DIAGNOSIS — Z8249 Family history of ischemic heart disease and other diseases of the circulatory system: Secondary | ICD-10-CM | POA: Diagnosis not present

## 2012-01-15 DIAGNOSIS — R4182 Altered mental status, unspecified: Secondary | ICD-10-CM | POA: Diagnosis not present

## 2012-01-15 DIAGNOSIS — E559 Vitamin D deficiency, unspecified: Secondary | ICD-10-CM | POA: Diagnosis present

## 2012-01-15 DIAGNOSIS — N183 Chronic kidney disease, stage 3 unspecified: Secondary | ICD-10-CM | POA: Diagnosis present

## 2012-01-15 DIAGNOSIS — I2589 Other forms of chronic ischemic heart disease: Secondary | ICD-10-CM | POA: Diagnosis present

## 2012-01-15 DIAGNOSIS — E860 Dehydration: Secondary | ICD-10-CM | POA: Diagnosis present

## 2012-01-15 DIAGNOSIS — E785 Hyperlipidemia, unspecified: Secondary | ICD-10-CM | POA: Diagnosis present

## 2012-01-15 DIAGNOSIS — S93409A Sprain of unspecified ligament of unspecified ankle, initial encounter: Secondary | ICD-10-CM | POA: Diagnosis present

## 2012-01-15 DIAGNOSIS — E782 Mixed hyperlipidemia: Secondary | ICD-10-CM | POA: Diagnosis not present

## 2012-01-15 DIAGNOSIS — F411 Generalized anxiety disorder: Secondary | ICD-10-CM | POA: Diagnosis present

## 2012-01-15 DIAGNOSIS — Z95810 Presence of automatic (implantable) cardiac defibrillator: Secondary | ICD-10-CM | POA: Diagnosis not present

## 2012-01-15 DIAGNOSIS — N179 Acute kidney failure, unspecified: Secondary | ICD-10-CM | POA: Diagnosis present

## 2012-01-15 DIAGNOSIS — M109 Gout, unspecified: Secondary | ICD-10-CM | POA: Diagnosis present

## 2012-01-15 DIAGNOSIS — I4891 Unspecified atrial fibrillation: Secondary | ICD-10-CM | POA: Diagnosis present

## 2012-01-15 DIAGNOSIS — I129 Hypertensive chronic kidney disease with stage 1 through stage 4 chronic kidney disease, or unspecified chronic kidney disease: Secondary | ICD-10-CM | POA: Diagnosis present

## 2012-01-15 DIAGNOSIS — Z87891 Personal history of nicotine dependence: Secondary | ICD-10-CM | POA: Diagnosis not present

## 2012-01-15 DIAGNOSIS — G9341 Metabolic encephalopathy: Secondary | ICD-10-CM | POA: Diagnosis not present

## 2012-01-15 DIAGNOSIS — N390 Urinary tract infection, site not specified: Secondary | ICD-10-CM | POA: Diagnosis not present

## 2012-01-15 DIAGNOSIS — R6889 Other general symptoms and signs: Secondary | ICD-10-CM | POA: Diagnosis not present

## 2012-01-15 DIAGNOSIS — R5381 Other malaise: Secondary | ICD-10-CM | POA: Diagnosis present

## 2012-01-15 DIAGNOSIS — G8929 Other chronic pain: Secondary | ICD-10-CM | POA: Diagnosis present

## 2012-01-15 DIAGNOSIS — M549 Dorsalgia, unspecified: Secondary | ICD-10-CM | POA: Diagnosis present

## 2012-01-15 DIAGNOSIS — E876 Hypokalemia: Secondary | ICD-10-CM | POA: Diagnosis not present

## 2012-01-15 DIAGNOSIS — F329 Major depressive disorder, single episode, unspecified: Secondary | ICD-10-CM | POA: Diagnosis present

## 2012-01-15 LAB — COMPREHENSIVE METABOLIC PANEL
Albumin: 3.1 g/dL — ABNORMAL LOW (ref 3.4–5.0)
Alkaline Phosphatase: 55 U/L (ref 50–136)
Anion Gap: 6 — ABNORMAL LOW (ref 7–16)
BUN: 35 mg/dL — ABNORMAL HIGH (ref 7–18)
Bilirubin,Total: 0.4 mg/dL (ref 0.2–1.0)
Chloride: 107 mmol/L (ref 98–107)
Co2: 28 mmol/L (ref 21–32)
Creatinine: 2.53 mg/dL — ABNORMAL HIGH (ref 0.60–1.30)
EGFR (African American): 29 — ABNORMAL LOW
EGFR (Non-African Amer.): 25 — ABNORMAL LOW
Glucose: 98 mg/dL (ref 65–99)
Osmolality: 289 (ref 275–301)
Potassium: 5.3 mmol/L — ABNORMAL HIGH (ref 3.5–5.1)
SGOT(AST): 22 U/L (ref 15–37)
SGPT (ALT): 13 U/L
Total Protein: 6.6 g/dL (ref 6.4–8.2)

## 2012-01-15 LAB — URINALYSIS, COMPLETE
Ketone: NEGATIVE
Ph: 5 (ref 4.5–8.0)
Protein: 100
Specific Gravity: 1.016 (ref 1.003–1.030)

## 2012-01-15 LAB — PROTIME-INR: Prothrombin Time: 24.9 secs — ABNORMAL HIGH (ref 11.5–14.7)

## 2012-01-15 LAB — CBC
HCT: 36.7 % — ABNORMAL LOW (ref 40.0–52.0)
HGB: 11.5 g/dL — ABNORMAL LOW (ref 13.0–18.0)
MCH: 30.6 pg (ref 26.0–34.0)
MCV: 97 fL (ref 80–100)
RBC: 3.76 10*6/uL — ABNORMAL LOW (ref 4.40–5.90)
RDW: 16.4 % — ABNORMAL HIGH (ref 11.5–14.5)

## 2012-01-15 LAB — TROPONIN I: Troponin-I: 0.04 ng/mL

## 2012-01-16 DIAGNOSIS — N390 Urinary tract infection, site not specified: Secondary | ICD-10-CM | POA: Diagnosis not present

## 2012-01-16 DIAGNOSIS — G9341 Metabolic encephalopathy: Secondary | ICD-10-CM | POA: Diagnosis not present

## 2012-01-16 DIAGNOSIS — E782 Mixed hyperlipidemia: Secondary | ICD-10-CM | POA: Diagnosis not present

## 2012-01-16 DIAGNOSIS — M25579 Pain in unspecified ankle and joints of unspecified foot: Secondary | ICD-10-CM | POA: Diagnosis not present

## 2012-01-16 DIAGNOSIS — I959 Hypotension, unspecified: Secondary | ICD-10-CM | POA: Diagnosis not present

## 2012-01-16 LAB — BASIC METABOLIC PANEL
Chloride: 107 mmol/L (ref 98–107)
Co2: 26 mmol/L (ref 21–32)
Creatinine: 2.15 mg/dL — ABNORMAL HIGH (ref 0.60–1.30)
EGFR (African American): 35 — ABNORMAL LOW
EGFR (Non-African Amer.): 30 — ABNORMAL LOW
Glucose: 88 mg/dL (ref 65–99)
Osmolality: 285 (ref 275–301)
Sodium: 140 mmol/L (ref 136–145)

## 2012-01-16 LAB — CBC WITH DIFFERENTIAL/PLATELET
Basophil #: 0 10*3/uL (ref 0.0–0.1)
Basophil %: 0.6 %
Eosinophil #: 0.3 10*3/uL (ref 0.0–0.7)
Eosinophil %: 5 %
HGB: 11.1 g/dL — ABNORMAL LOW (ref 13.0–18.0)
Lymphocyte #: 1 10*3/uL (ref 1.0–3.6)
Lymphocyte %: 14.2 %
MCHC: 32.3 g/dL (ref 32.0–36.0)
MCV: 97 fL (ref 80–100)
Neutrophil #: 4.6 10*3/uL (ref 1.4–6.5)
Neutrophil %: 68.1 %
Platelet: 160 10*3/uL (ref 150–440)
RBC: 3.53 10*6/uL — ABNORMAL LOW (ref 4.40–5.90)

## 2012-01-16 LAB — PROTIME-INR
INR: 2.3
Prothrombin Time: 25.2 secs — ABNORMAL HIGH (ref 11.5–14.7)

## 2012-01-17 DIAGNOSIS — N390 Urinary tract infection, site not specified: Secondary | ICD-10-CM | POA: Diagnosis not present

## 2012-01-17 DIAGNOSIS — I959 Hypotension, unspecified: Secondary | ICD-10-CM | POA: Diagnosis not present

## 2012-01-17 DIAGNOSIS — G9341 Metabolic encephalopathy: Secondary | ICD-10-CM | POA: Diagnosis not present

## 2012-01-17 DIAGNOSIS — E782 Mixed hyperlipidemia: Secondary | ICD-10-CM | POA: Diagnosis not present

## 2012-01-17 LAB — CBC WITH DIFFERENTIAL/PLATELET
Basophil #: 0 10*3/uL (ref 0.0–0.1)
Eosinophil #: 0.3 10*3/uL (ref 0.0–0.7)
Eosinophil %: 5 %
HCT: 35.3 % — ABNORMAL LOW (ref 40.0–52.0)
HGB: 11.3 g/dL — ABNORMAL LOW (ref 13.0–18.0)
Lymphocyte %: 14 %
MCH: 31 pg (ref 26.0–34.0)
MCHC: 32.1 g/dL (ref 32.0–36.0)
MCV: 97 fL (ref 80–100)
Monocyte #: 0.8 x10 3/mm (ref 0.2–1.0)
Neutrophil %: 67.1 %
Platelet: 151 10*3/uL (ref 150–440)
RBC: 3.64 10*6/uL — ABNORMAL LOW (ref 4.40–5.90)
RDW: 15.6 % — ABNORMAL HIGH (ref 11.5–14.5)
WBC: 6.1 10*3/uL (ref 3.8–10.6)

## 2012-01-17 LAB — BASIC METABOLIC PANEL
BUN: 24 mg/dL — ABNORMAL HIGH (ref 7–18)
Calcium, Total: 8.7 mg/dL (ref 8.5–10.1)
Co2: 25 mmol/L (ref 21–32)
Creatinine: 1.71 mg/dL — ABNORMAL HIGH (ref 0.60–1.30)
Potassium: 4 mmol/L (ref 3.5–5.1)

## 2012-01-17 LAB — PROTIME-INR: INR: 2.4

## 2012-01-18 LAB — URINE CULTURE

## 2012-01-19 DIAGNOSIS — M6281 Muscle weakness (generalized): Secondary | ICD-10-CM | POA: Diagnosis not present

## 2012-01-19 DIAGNOSIS — N390 Urinary tract infection, site not specified: Secondary | ICD-10-CM | POA: Diagnosis not present

## 2012-01-19 DIAGNOSIS — R269 Unspecified abnormalities of gait and mobility: Secondary | ICD-10-CM | POA: Diagnosis not present

## 2012-01-19 DIAGNOSIS — IMO0001 Reserved for inherently not codable concepts without codable children: Secondary | ICD-10-CM | POA: Diagnosis not present

## 2012-01-19 DIAGNOSIS — S93409A Sprain of unspecified ligament of unspecified ankle, initial encounter: Secondary | ICD-10-CM | POA: Diagnosis not present

## 2012-01-19 DIAGNOSIS — M545 Low back pain: Secondary | ICD-10-CM | POA: Diagnosis not present

## 2012-01-22 DIAGNOSIS — Z7901 Long term (current) use of anticoagulants: Secondary | ICD-10-CM | POA: Diagnosis not present

## 2012-01-22 DIAGNOSIS — Z1389 Encounter for screening for other disorder: Secondary | ICD-10-CM | POA: Diagnosis not present

## 2012-01-22 DIAGNOSIS — N390 Urinary tract infection, site not specified: Secondary | ICD-10-CM | POA: Diagnosis not present

## 2012-01-22 DIAGNOSIS — I4891 Unspecified atrial fibrillation: Secondary | ICD-10-CM | POA: Diagnosis not present

## 2012-01-23 DIAGNOSIS — N390 Urinary tract infection, site not specified: Secondary | ICD-10-CM | POA: Diagnosis not present

## 2012-01-23 DIAGNOSIS — R269 Unspecified abnormalities of gait and mobility: Secondary | ICD-10-CM | POA: Diagnosis not present

## 2012-01-23 DIAGNOSIS — M545 Low back pain: Secondary | ICD-10-CM | POA: Diagnosis not present

## 2012-01-23 DIAGNOSIS — IMO0001 Reserved for inherently not codable concepts without codable children: Secondary | ICD-10-CM | POA: Diagnosis not present

## 2012-01-23 DIAGNOSIS — S93409A Sprain of unspecified ligament of unspecified ankle, initial encounter: Secondary | ICD-10-CM | POA: Diagnosis not present

## 2012-01-23 DIAGNOSIS — M6281 Muscle weakness (generalized): Secondary | ICD-10-CM | POA: Diagnosis not present

## 2012-01-25 DIAGNOSIS — S93409A Sprain of unspecified ligament of unspecified ankle, initial encounter: Secondary | ICD-10-CM | POA: Diagnosis not present

## 2012-01-25 DIAGNOSIS — M6281 Muscle weakness (generalized): Secondary | ICD-10-CM | POA: Diagnosis not present

## 2012-01-25 DIAGNOSIS — R269 Unspecified abnormalities of gait and mobility: Secondary | ICD-10-CM | POA: Diagnosis not present

## 2012-01-25 DIAGNOSIS — IMO0001 Reserved for inherently not codable concepts without codable children: Secondary | ICD-10-CM | POA: Diagnosis not present

## 2012-01-25 DIAGNOSIS — M545 Low back pain: Secondary | ICD-10-CM | POA: Diagnosis not present

## 2012-01-25 DIAGNOSIS — N390 Urinary tract infection, site not specified: Secondary | ICD-10-CM | POA: Diagnosis not present

## 2012-01-29 DIAGNOSIS — M545 Low back pain: Secondary | ICD-10-CM | POA: Diagnosis not present

## 2012-01-29 DIAGNOSIS — N390 Urinary tract infection, site not specified: Secondary | ICD-10-CM | POA: Diagnosis not present

## 2012-01-29 DIAGNOSIS — M6281 Muscle weakness (generalized): Secondary | ICD-10-CM | POA: Diagnosis not present

## 2012-01-29 DIAGNOSIS — S93409A Sprain of unspecified ligament of unspecified ankle, initial encounter: Secondary | ICD-10-CM | POA: Diagnosis not present

## 2012-01-29 DIAGNOSIS — IMO0001 Reserved for inherently not codable concepts without codable children: Secondary | ICD-10-CM | POA: Diagnosis not present

## 2012-01-29 DIAGNOSIS — R269 Unspecified abnormalities of gait and mobility: Secondary | ICD-10-CM | POA: Diagnosis not present

## 2012-01-30 DIAGNOSIS — IMO0001 Reserved for inherently not codable concepts without codable children: Secondary | ICD-10-CM | POA: Diagnosis not present

## 2012-01-30 DIAGNOSIS — M6281 Muscle weakness (generalized): Secondary | ICD-10-CM | POA: Diagnosis not present

## 2012-01-30 DIAGNOSIS — R269 Unspecified abnormalities of gait and mobility: Secondary | ICD-10-CM | POA: Diagnosis not present

## 2012-01-30 DIAGNOSIS — M545 Low back pain: Secondary | ICD-10-CM | POA: Diagnosis not present

## 2012-01-30 DIAGNOSIS — N390 Urinary tract infection, site not specified: Secondary | ICD-10-CM | POA: Diagnosis not present

## 2012-01-30 DIAGNOSIS — S93409A Sprain of unspecified ligament of unspecified ankle, initial encounter: Secondary | ICD-10-CM | POA: Diagnosis not present

## 2012-02-05 DIAGNOSIS — M545 Low back pain: Secondary | ICD-10-CM | POA: Diagnosis not present

## 2012-02-05 DIAGNOSIS — M6281 Muscle weakness (generalized): Secondary | ICD-10-CM | POA: Diagnosis not present

## 2012-02-05 DIAGNOSIS — S93409A Sprain of unspecified ligament of unspecified ankle, initial encounter: Secondary | ICD-10-CM | POA: Diagnosis not present

## 2012-02-05 DIAGNOSIS — N390 Urinary tract infection, site not specified: Secondary | ICD-10-CM | POA: Diagnosis not present

## 2012-02-05 DIAGNOSIS — IMO0001 Reserved for inherently not codable concepts without codable children: Secondary | ICD-10-CM | POA: Diagnosis not present

## 2012-02-05 DIAGNOSIS — R269 Unspecified abnormalities of gait and mobility: Secondary | ICD-10-CM | POA: Diagnosis not present

## 2012-02-21 DIAGNOSIS — I4891 Unspecified atrial fibrillation: Secondary | ICD-10-CM | POA: Diagnosis not present

## 2012-02-27 DIAGNOSIS — Z95810 Presence of automatic (implantable) cardiac defibrillator: Secondary | ICD-10-CM | POA: Diagnosis not present

## 2012-02-28 DIAGNOSIS — I4891 Unspecified atrial fibrillation: Secondary | ICD-10-CM | POA: Diagnosis not present

## 2012-02-28 DIAGNOSIS — I10 Essential (primary) hypertension: Secondary | ICD-10-CM | POA: Diagnosis not present

## 2012-02-28 DIAGNOSIS — G894 Chronic pain syndrome: Secondary | ICD-10-CM | POA: Diagnosis not present

## 2012-02-28 DIAGNOSIS — R55 Syncope and collapse: Secondary | ICD-10-CM | POA: Diagnosis not present

## 2012-02-28 DIAGNOSIS — Z7901 Long term (current) use of anticoagulants: Secondary | ICD-10-CM | POA: Diagnosis not present

## 2012-03-19 DIAGNOSIS — G894 Chronic pain syndrome: Secondary | ICD-10-CM | POA: Diagnosis not present

## 2012-03-22 DIAGNOSIS — M109 Gout, unspecified: Secondary | ICD-10-CM | POA: Diagnosis not present

## 2012-03-22 DIAGNOSIS — I10 Essential (primary) hypertension: Secondary | ICD-10-CM | POA: Diagnosis not present

## 2012-03-22 DIAGNOSIS — N2581 Secondary hyperparathyroidism of renal origin: Secondary | ICD-10-CM | POA: Diagnosis not present

## 2012-03-25 DIAGNOSIS — I4891 Unspecified atrial fibrillation: Secondary | ICD-10-CM | POA: Diagnosis not present

## 2012-04-22 DIAGNOSIS — I4891 Unspecified atrial fibrillation: Secondary | ICD-10-CM | POA: Diagnosis not present

## 2012-05-28 DIAGNOSIS — I4891 Unspecified atrial fibrillation: Secondary | ICD-10-CM | POA: Diagnosis not present

## 2012-06-18 DIAGNOSIS — Z7901 Long term (current) use of anticoagulants: Secondary | ICD-10-CM | POA: Diagnosis not present

## 2012-06-18 DIAGNOSIS — I4891 Unspecified atrial fibrillation: Secondary | ICD-10-CM | POA: Diagnosis not present

## 2012-06-18 DIAGNOSIS — G894 Chronic pain syndrome: Secondary | ICD-10-CM | POA: Diagnosis not present

## 2012-06-18 DIAGNOSIS — I10 Essential (primary) hypertension: Secondary | ICD-10-CM | POA: Diagnosis not present

## 2012-06-24 DIAGNOSIS — I4891 Unspecified atrial fibrillation: Secondary | ICD-10-CM | POA: Diagnosis not present

## 2012-07-22 DIAGNOSIS — I4891 Unspecified atrial fibrillation: Secondary | ICD-10-CM | POA: Diagnosis not present

## 2012-08-20 DIAGNOSIS — I4891 Unspecified atrial fibrillation: Secondary | ICD-10-CM | POA: Diagnosis not present

## 2012-09-03 DIAGNOSIS — F341 Dysthymic disorder: Secondary | ICD-10-CM | POA: Diagnosis not present

## 2012-09-03 DIAGNOSIS — R0602 Shortness of breath: Secondary | ICD-10-CM | POA: Diagnosis not present

## 2012-09-03 DIAGNOSIS — I10 Essential (primary) hypertension: Secondary | ICD-10-CM | POA: Diagnosis not present

## 2012-09-03 DIAGNOSIS — G894 Chronic pain syndrome: Secondary | ICD-10-CM | POA: Diagnosis not present

## 2012-09-10 ENCOUNTER — Inpatient Hospital Stay: Payer: Self-pay | Admitting: Internal Medicine

## 2012-09-10 DIAGNOSIS — F0390 Unspecified dementia without behavioral disturbance: Secondary | ICD-10-CM | POA: Diagnosis present

## 2012-09-10 DIAGNOSIS — G8929 Other chronic pain: Secondary | ICD-10-CM | POA: Diagnosis present

## 2012-09-10 DIAGNOSIS — M109 Gout, unspecified: Secondary | ICD-10-CM | POA: Diagnosis present

## 2012-09-10 DIAGNOSIS — Z043 Encounter for examination and observation following other accident: Secondary | ICD-10-CM | POA: Diagnosis not present

## 2012-09-10 DIAGNOSIS — S42309A Unspecified fracture of shaft of humerus, unspecified arm, initial encounter for closed fracture: Secondary | ICD-10-CM | POA: Diagnosis not present

## 2012-09-10 DIAGNOSIS — F411 Generalized anxiety disorder: Secondary | ICD-10-CM | POA: Diagnosis not present

## 2012-09-10 DIAGNOSIS — I428 Other cardiomyopathies: Secondary | ICD-10-CM | POA: Diagnosis not present

## 2012-09-10 DIAGNOSIS — Z87891 Personal history of nicotine dependence: Secondary | ICD-10-CM | POA: Diagnosis not present

## 2012-09-10 DIAGNOSIS — IMO0002 Reserved for concepts with insufficient information to code with codable children: Secondary | ICD-10-CM | POA: Diagnosis present

## 2012-09-10 DIAGNOSIS — N183 Chronic kidney disease, stage 3 unspecified: Secondary | ICD-10-CM | POA: Diagnosis not present

## 2012-09-10 DIAGNOSIS — M6281 Muscle weakness (generalized): Secondary | ICD-10-CM | POA: Diagnosis not present

## 2012-09-10 DIAGNOSIS — I5023 Acute on chronic systolic (congestive) heart failure: Secondary | ICD-10-CM | POA: Diagnosis not present

## 2012-09-10 DIAGNOSIS — Z95810 Presence of automatic (implantable) cardiac defibrillator: Secondary | ICD-10-CM | POA: Diagnosis not present

## 2012-09-10 DIAGNOSIS — J811 Chronic pulmonary edema: Secondary | ICD-10-CM | POA: Diagnosis not present

## 2012-09-10 DIAGNOSIS — Z6835 Body mass index (BMI) 35.0-35.9, adult: Secondary | ICD-10-CM | POA: Diagnosis not present

## 2012-09-10 DIAGNOSIS — S42209A Unspecified fracture of upper end of unspecified humerus, initial encounter for closed fracture: Secondary | ICD-10-CM | POA: Diagnosis not present

## 2012-09-10 DIAGNOSIS — G9340 Encephalopathy, unspecified: Secondary | ICD-10-CM | POA: Diagnosis not present

## 2012-09-10 DIAGNOSIS — I509 Heart failure, unspecified: Secondary | ICD-10-CM | POA: Diagnosis present

## 2012-09-10 DIAGNOSIS — M25519 Pain in unspecified shoulder: Secondary | ICD-10-CM | POA: Diagnosis not present

## 2012-09-10 DIAGNOSIS — E785 Hyperlipidemia, unspecified: Secondary | ICD-10-CM | POA: Diagnosis present

## 2012-09-10 DIAGNOSIS — R0602 Shortness of breath: Secondary | ICD-10-CM | POA: Diagnosis not present

## 2012-09-10 DIAGNOSIS — I4891 Unspecified atrial fibrillation: Secondary | ICD-10-CM | POA: Diagnosis not present

## 2012-09-10 DIAGNOSIS — R6889 Other general symptoms and signs: Secondary | ICD-10-CM | POA: Diagnosis not present

## 2012-09-10 DIAGNOSIS — I10 Essential (primary) hypertension: Secondary | ICD-10-CM | POA: Diagnosis not present

## 2012-09-10 DIAGNOSIS — E559 Vitamin D deficiency, unspecified: Secondary | ICD-10-CM | POA: Diagnosis present

## 2012-09-10 DIAGNOSIS — R079 Chest pain, unspecified: Secondary | ICD-10-CM | POA: Diagnosis not present

## 2012-09-10 DIAGNOSIS — M545 Low back pain: Secondary | ICD-10-CM | POA: Diagnosis present

## 2012-09-10 DIAGNOSIS — N170 Acute kidney failure with tubular necrosis: Secondary | ICD-10-CM | POA: Diagnosis not present

## 2012-09-10 DIAGNOSIS — Z7901 Long term (current) use of anticoagulants: Secondary | ICD-10-CM | POA: Diagnosis not present

## 2012-09-10 DIAGNOSIS — R5381 Other malaise: Secondary | ICD-10-CM | POA: Diagnosis not present

## 2012-09-10 DIAGNOSIS — Z0389 Encounter for observation for other suspected diseases and conditions ruled out: Secondary | ICD-10-CM | POA: Diagnosis not present

## 2012-09-10 DIAGNOSIS — E871 Hypo-osmolality and hyponatremia: Secondary | ICD-10-CM | POA: Diagnosis not present

## 2012-09-10 DIAGNOSIS — N179 Acute kidney failure, unspecified: Secondary | ICD-10-CM | POA: Diagnosis not present

## 2012-09-10 DIAGNOSIS — I129 Hypertensive chronic kidney disease with stage 1 through stage 4 chronic kidney disease, or unspecified chronic kidney disease: Secondary | ICD-10-CM | POA: Diagnosis not present

## 2012-09-10 DIAGNOSIS — S2239XA Fracture of one rib, unspecified side, initial encounter for closed fracture: Secondary | ICD-10-CM | POA: Diagnosis not present

## 2012-09-10 DIAGNOSIS — R799 Abnormal finding of blood chemistry, unspecified: Secondary | ICD-10-CM | POA: Diagnosis not present

## 2012-09-10 DIAGNOSIS — I2589 Other forms of chronic ischemic heart disease: Secondary | ICD-10-CM | POA: Diagnosis present

## 2012-09-10 LAB — COMPREHENSIVE METABOLIC PANEL
Albumin: 3.6 g/dL (ref 3.4–5.0)
Anion Gap: 7 (ref 7–16)
BUN: 29 mg/dL — ABNORMAL HIGH (ref 7–18)
Bilirubin,Total: 1.3 mg/dL — ABNORMAL HIGH (ref 0.2–1.0)
Calcium, Total: 9.3 mg/dL (ref 8.5–10.1)
EGFR (African American): 48 — ABNORMAL LOW
EGFR (Non-African Amer.): 42 — ABNORMAL LOW
Glucose: 144 mg/dL — ABNORMAL HIGH (ref 65–99)
Potassium: 3.7 mmol/L (ref 3.5–5.1)
SGPT (ALT): 24 U/L (ref 12–78)
Sodium: 145 mmol/L (ref 136–145)
Total Protein: 7.2 g/dL (ref 6.4–8.2)

## 2012-09-10 LAB — URINALYSIS, COMPLETE
Bilirubin,UR: NEGATIVE
Glucose,UR: 50 mg/dL (ref 0–75)
Ketone: NEGATIVE
Leukocyte Esterase: NEGATIVE
Ph: 6 (ref 4.5–8.0)
Protein: >=500
WBC UR: 3 /HPF (ref 0–5)

## 2012-09-10 LAB — CBC WITH DIFFERENTIAL/PLATELET
Basophil #: 0 10*3/uL (ref 0.0–0.1)
Eosinophil %: 0 %
HCT: 42.6 % (ref 40.0–52.0)
Lymphocyte #: 0.6 10*3/uL — ABNORMAL LOW (ref 1.0–3.6)
MCH: 29.1 pg (ref 26.0–34.0)
MCV: 89 fL (ref 80–100)
Monocyte #: 1.1 x10 3/mm — ABNORMAL HIGH (ref 0.2–1.0)
Monocyte %: 8.4 %
Neutrophil #: 11.9 10*3/uL — ABNORMAL HIGH (ref 1.4–6.5)
RDW: 16.5 % — ABNORMAL HIGH (ref 11.5–14.5)
WBC: 13.7 10*3/uL — ABNORMAL HIGH (ref 3.8–10.6)

## 2012-09-10 LAB — APTT: Activated PTT: 35.7 secs (ref 23.6–35.9)

## 2012-09-10 LAB — CK TOTAL AND CKMB (NOT AT ARMC)
CK, Total: 244 U/L — ABNORMAL HIGH (ref 35–232)
CK, Total: 239 U/L — ABNORMAL HIGH (ref 35–232)
CK-MB: 4.4 ng/mL — ABNORMAL HIGH (ref 0.5–3.6)
CK-MB: 3 ng/mL (ref 0.5–3.6)

## 2012-09-10 LAB — PROTIME-INR: Prothrombin Time: 26.2 secs — ABNORMAL HIGH (ref 11.5–14.7)

## 2012-09-10 LAB — PRO B NATRIURETIC PEPTIDE: B-Type Natriuretic Peptide: 24585 pg/mL — ABNORMAL HIGH (ref 0–125)

## 2012-09-10 LAB — TROPONIN I: Troponin-I: 0.07 ng/mL — ABNORMAL HIGH

## 2012-09-11 LAB — CBC WITH DIFFERENTIAL/PLATELET
Basophil %: 0.3 %
Eosinophil %: 1 %
HCT: 41.1 % (ref 40.0–52.0)
Lymphocyte #: 1 10*3/uL (ref 1.0–3.6)
Lymphocyte %: 8.1 %
MCH: 29.1 pg (ref 26.0–34.0)
MCV: 90 fL (ref 80–100)
Monocyte #: 1.7 x10 3/mm — ABNORMAL HIGH (ref 0.2–1.0)
Neutrophil #: 9.9 10*3/uL — ABNORMAL HIGH (ref 1.4–6.5)
Platelet: 167 10*3/uL (ref 150–440)
RBC: 4.58 10*6/uL (ref 4.40–5.90)
RDW: 16.2 % — ABNORMAL HIGH (ref 11.5–14.5)

## 2012-09-11 LAB — CK TOTAL AND CKMB (NOT AT ARMC)
CK, Total: 162 U/L (ref 35–232)
CK-MB: 2.4 ng/mL (ref 0.5–3.6)

## 2012-09-11 LAB — BASIC METABOLIC PANEL
Calcium, Total: 8.8 mg/dL (ref 8.5–10.1)
Chloride: 109 mmol/L — ABNORMAL HIGH (ref 98–107)
Co2: 30 mmol/L (ref 21–32)
Creatinine: 1.8 mg/dL — ABNORMAL HIGH (ref 0.60–1.30)
EGFR (African American): 44 — ABNORMAL LOW
Glucose: 126 mg/dL — ABNORMAL HIGH (ref 65–99)
Osmolality: 294 (ref 275–301)
Potassium: 3.9 mmol/L (ref 3.5–5.1)
Sodium: 143 mmol/L (ref 136–145)

## 2012-09-11 LAB — PROTIME-INR
INR: 3.2
Prothrombin Time: 32.4 secs — ABNORMAL HIGH (ref 11.5–14.7)

## 2012-09-12 LAB — BASIC METABOLIC PANEL
BUN: 40 mg/dL — ABNORMAL HIGH (ref 7–18)
Calcium, Total: 8.9 mg/dL (ref 8.5–10.1)
Chloride: 104 mmol/L (ref 98–107)
Creatinine: 2.12 mg/dL — ABNORMAL HIGH (ref 0.60–1.30)
EGFR (African American): 36 — ABNORMAL LOW
EGFR (Non-African Amer.): 31 — ABNORMAL LOW
Glucose: 153 mg/dL — ABNORMAL HIGH (ref 65–99)
Potassium: 3.7 mmol/L (ref 3.5–5.1)
Sodium: 140 mmol/L (ref 136–145)

## 2012-09-12 LAB — PROTIME-INR: Prothrombin Time: 38.6 secs — ABNORMAL HIGH (ref 11.5–14.7)

## 2012-09-13 LAB — CBC WITH DIFFERENTIAL/PLATELET
Basophil #: 0 10*3/uL (ref 0.0–0.1)
Eosinophil #: 0.4 10*3/uL (ref 0.0–0.7)
HCT: 35.4 % — ABNORMAL LOW (ref 40.0–52.0)
Lymphocyte #: 0.8 10*3/uL — ABNORMAL LOW (ref 1.0–3.6)
Lymphocyte %: 5.8 %
MCH: 28.4 pg (ref 26.0–34.0)
MCV: 88 fL (ref 80–100)
Monocyte %: 11.2 %
Neutrophil #: 10.6 10*3/uL — ABNORMAL HIGH (ref 1.4–6.5)
Neutrophil %: 79.8 %
Platelet: 169 10*3/uL (ref 150–440)
RBC: 4.01 10*6/uL — ABNORMAL LOW (ref 4.40–5.90)
RDW: 15.8 % — ABNORMAL HIGH (ref 11.5–14.5)
WBC: 13.3 10*3/uL — ABNORMAL HIGH (ref 3.8–10.6)

## 2012-09-13 LAB — BASIC METABOLIC PANEL
BUN: 53 mg/dL — ABNORMAL HIGH (ref 7–18)
Calcium, Total: 8.4 mg/dL — ABNORMAL LOW (ref 8.5–10.1)
Chloride: 101 mmol/L (ref 98–107)
Co2: 30 mmol/L (ref 21–32)
Creatinine: 2.89 mg/dL — ABNORMAL HIGH (ref 0.60–1.30)
Potassium: 4.3 mmol/L (ref 3.5–5.1)
Sodium: 139 mmol/L (ref 136–145)

## 2012-09-13 LAB — PROTIME-INR: Prothrombin Time: 36.4 secs — ABNORMAL HIGH (ref 11.5–14.7)

## 2012-09-14 LAB — BASIC METABOLIC PANEL
Anion Gap: 6 — ABNORMAL LOW (ref 7–16)
Calcium, Total: 8.4 mg/dL — ABNORMAL LOW (ref 8.5–10.1)
Chloride: 98 mmol/L (ref 98–107)
Co2: 32 mmol/L (ref 21–32)
Creatinine: 3.42 mg/dL — ABNORMAL HIGH (ref 0.60–1.30)
EGFR (African American): 20 — ABNORMAL LOW
Osmolality: 293 (ref 275–301)
Potassium: 4.2 mmol/L (ref 3.5–5.1)
Sodium: 136 mmol/L (ref 136–145)

## 2012-09-14 LAB — PROTIME-INR: Prothrombin Time: 29.8 secs — ABNORMAL HIGH (ref 11.5–14.7)

## 2012-09-15 LAB — BASIC METABOLIC PANEL
Co2: 28 mmol/L (ref 21–32)
Creatinine: 3.15 mg/dL — ABNORMAL HIGH (ref 0.60–1.30)
EGFR (African American): 22 — ABNORMAL LOW
EGFR (Non-African Amer.): 19 — ABNORMAL LOW
Glucose: 155 mg/dL — ABNORMAL HIGH (ref 65–99)
Osmolality: 297 (ref 275–301)
Potassium: 4.3 mmol/L (ref 3.5–5.1)
Sodium: 135 mmol/L — ABNORMAL LOW (ref 136–145)

## 2012-09-15 LAB — PROTIME-INR: INR: 2.5

## 2012-09-16 LAB — BASIC METABOLIC PANEL
Anion Gap: 7 (ref 7–16)
BUN: 67 mg/dL — ABNORMAL HIGH (ref 7–18)
Chloride: 105 mmol/L (ref 98–107)
Co2: 28 mmol/L (ref 21–32)
EGFR (African American): 32 — ABNORMAL LOW
EGFR (Non-African Amer.): 27 — ABNORMAL LOW
Glucose: 144 mg/dL — ABNORMAL HIGH (ref 65–99)
Potassium: 4.2 mmol/L (ref 3.5–5.1)
Sodium: 140 mmol/L (ref 136–145)

## 2012-09-16 LAB — PROTIME-INR
INR: 1.9
Prothrombin Time: 22.5 secs — ABNORMAL HIGH (ref 11.5–14.7)

## 2012-09-17 LAB — BASIC METABOLIC PANEL
BUN: 49 mg/dL — ABNORMAL HIGH (ref 7–18)
Chloride: 108 mmol/L — ABNORMAL HIGH (ref 98–107)
Creatinine: 1.92 mg/dL — ABNORMAL HIGH (ref 0.60–1.30)
EGFR (Non-African Amer.): 35 — ABNORMAL LOW
Glucose: 127 mg/dL — ABNORMAL HIGH (ref 65–99)
Osmolality: 298 (ref 275–301)
Potassium: 4.3 mmol/L (ref 3.5–5.1)
Sodium: 142 mmol/L (ref 136–145)

## 2012-09-17 LAB — AMMONIA: Ammonia, Plasma: <25 mcmol/L (ref 11–32)

## 2012-09-17 LAB — LIPID PANEL
Cholesterol: 106 mg/dL (ref 0–200)
HDL Cholesterol: 31 mg/dL — ABNORMAL LOW (ref 40–60)
Triglycerides: 81 mg/dL (ref 0–200)

## 2012-09-17 LAB — WBC: WBC: 8.3 10*3/uL (ref 3.8–10.6)

## 2012-09-17 LAB — HEMOGLOBIN A1C: Hemoglobin A1C: 6 % (ref 4.2–6.3)

## 2012-09-17 LAB — PROTIME-INR: Prothrombin Time: 21.7 secs — ABNORMAL HIGH (ref 11.5–14.7)

## 2012-09-18 LAB — BASIC METABOLIC PANEL
Anion Gap: 7 (ref 7–16)
BUN: 41 mg/dL — ABNORMAL HIGH (ref 7–18)
Chloride: 107 mmol/L (ref 98–107)
Creatinine: 1.48 mg/dL — ABNORMAL HIGH (ref 0.60–1.30)
EGFR (African American): 55 — ABNORMAL LOW
EGFR (Non-African Amer.): 48 — ABNORMAL LOW
Glucose: 125 mg/dL — ABNORMAL HIGH (ref 65–99)

## 2012-09-18 LAB — CBC WITH DIFFERENTIAL/PLATELET
Basophil %: 0.7 %
Eosinophil #: 0.5 10*3/uL (ref 0.0–0.7)
Eosinophil %: 5.4 %
Lymphocyte %: 10.2 %
MCH: 28.8 pg (ref 26.0–34.0)
MCHC: 32.6 g/dL (ref 32.0–36.0)
MCV: 89 fL (ref 80–100)
Monocyte #: 1.1 x10 3/mm — ABNORMAL HIGH (ref 0.2–1.0)
Monocyte %: 11.8 %
Neutrophil #: 6.6 10*3/uL — ABNORMAL HIGH (ref 1.4–6.5)
Neutrophil %: 71.9 %
RBC: 3.96 10*6/uL — ABNORMAL LOW (ref 4.40–5.90)
RDW: 15.3 % — ABNORMAL HIGH (ref 11.5–14.5)

## 2012-09-19 DIAGNOSIS — S42309A Unspecified fracture of shaft of humerus, unspecified arm, initial encounter for closed fracture: Secondary | ICD-10-CM | POA: Diagnosis not present

## 2012-09-19 DIAGNOSIS — I5022 Chronic systolic (congestive) heart failure: Secondary | ICD-10-CM | POA: Diagnosis not present

## 2012-09-19 DIAGNOSIS — N179 Acute kidney failure, unspecified: Secondary | ICD-10-CM | POA: Diagnosis not present

## 2012-09-19 DIAGNOSIS — M6281 Muscle weakness (generalized): Secondary | ICD-10-CM | POA: Diagnosis not present

## 2012-09-19 DIAGNOSIS — I10 Essential (primary) hypertension: Secondary | ICD-10-CM | POA: Diagnosis not present

## 2012-09-19 DIAGNOSIS — I509 Heart failure, unspecified: Secondary | ICD-10-CM | POA: Diagnosis not present

## 2012-09-19 DIAGNOSIS — F411 Generalized anxiety disorder: Secondary | ICD-10-CM | POA: Diagnosis not present

## 2012-09-19 DIAGNOSIS — Z9181 History of falling: Secondary | ICD-10-CM | POA: Diagnosis not present

## 2012-09-19 DIAGNOSIS — Z95810 Presence of automatic (implantable) cardiac defibrillator: Secondary | ICD-10-CM | POA: Diagnosis not present

## 2012-09-19 DIAGNOSIS — I428 Other cardiomyopathies: Secondary | ICD-10-CM | POA: Diagnosis not present

## 2012-09-19 DIAGNOSIS — N183 Chronic kidney disease, stage 3 unspecified: Secondary | ICD-10-CM | POA: Diagnosis not present

## 2012-09-19 DIAGNOSIS — E871 Hypo-osmolality and hyponatremia: Secondary | ICD-10-CM | POA: Diagnosis not present

## 2012-09-19 DIAGNOSIS — I4891 Unspecified atrial fibrillation: Secondary | ICD-10-CM | POA: Diagnosis not present

## 2012-09-19 DIAGNOSIS — I5023 Acute on chronic systolic (congestive) heart failure: Secondary | ICD-10-CM | POA: Diagnosis not present

## 2012-09-19 DIAGNOSIS — E785 Hyperlipidemia, unspecified: Secondary | ICD-10-CM | POA: Diagnosis not present

## 2012-09-19 DIAGNOSIS — L03019 Cellulitis of unspecified finger: Secondary | ICD-10-CM | POA: Diagnosis not present

## 2012-09-19 DIAGNOSIS — Z7901 Long term (current) use of anticoagulants: Secondary | ICD-10-CM | POA: Diagnosis not present

## 2012-09-19 DIAGNOSIS — J069 Acute upper respiratory infection, unspecified: Secondary | ICD-10-CM | POA: Diagnosis not present

## 2012-09-19 LAB — CREATININE, SERUM: EGFR (Non-African Amer.): 51 — ABNORMAL LOW

## 2012-09-20 DIAGNOSIS — F411 Generalized anxiety disorder: Secondary | ICD-10-CM | POA: Diagnosis not present

## 2012-09-20 DIAGNOSIS — I4891 Unspecified atrial fibrillation: Secondary | ICD-10-CM

## 2012-09-20 DIAGNOSIS — I5022 Chronic systolic (congestive) heart failure: Secondary | ICD-10-CM | POA: Diagnosis not present

## 2012-09-20 DIAGNOSIS — N183 Chronic kidney disease, stage 3 (moderate): Secondary | ICD-10-CM

## 2012-09-23 DIAGNOSIS — L03019 Cellulitis of unspecified finger: Secondary | ICD-10-CM

## 2012-10-03 DIAGNOSIS — J069 Acute upper respiratory infection, unspecified: Secondary | ICD-10-CM

## 2012-10-15 ENCOUNTER — Encounter: Payer: Self-pay | Admitting: Internal Medicine

## 2012-10-15 DIAGNOSIS — I10 Essential (primary) hypertension: Secondary | ICD-10-CM | POA: Insufficient documentation

## 2012-10-15 DIAGNOSIS — F39 Unspecified mood [affective] disorder: Secondary | ICD-10-CM | POA: Insufficient documentation

## 2012-10-15 DIAGNOSIS — M109 Gout, unspecified: Secondary | ICD-10-CM | POA: Insufficient documentation

## 2012-10-15 DIAGNOSIS — I4891 Unspecified atrial fibrillation: Secondary | ICD-10-CM | POA: Diagnosis not present

## 2012-10-15 DIAGNOSIS — N184 Chronic kidney disease, stage 4 (severe): Secondary | ICD-10-CM | POA: Insufficient documentation

## 2012-10-24 ENCOUNTER — Ambulatory Visit: Payer: Medicare Other | Admitting: Internal Medicine

## 2012-10-31 ENCOUNTER — Encounter: Payer: Self-pay | Admitting: Internal Medicine

## 2012-10-31 ENCOUNTER — Ambulatory Visit (INDEPENDENT_AMBULATORY_CARE_PROVIDER_SITE_OTHER): Payer: Medicare Other | Admitting: Internal Medicine

## 2012-10-31 VITALS — BP 138/80 | HR 75 | Temp 97.8°F | Ht 70.0 in | Wt 214.0 lb

## 2012-10-31 DIAGNOSIS — F411 Generalized anxiety disorder: Secondary | ICD-10-CM | POA: Diagnosis not present

## 2012-10-31 DIAGNOSIS — I5022 Chronic systolic (congestive) heart failure: Secondary | ICD-10-CM

## 2012-10-31 DIAGNOSIS — I4891 Unspecified atrial fibrillation: Secondary | ICD-10-CM

## 2012-10-31 DIAGNOSIS — S20229A Contusion of unspecified back wall of thorax, initial encounter: Secondary | ICD-10-CM | POA: Diagnosis not present

## 2012-10-31 DIAGNOSIS — S300XXA Contusion of lower back and pelvis, initial encounter: Secondary | ICD-10-CM | POA: Insufficient documentation

## 2012-10-31 DIAGNOSIS — F419 Anxiety disorder, unspecified: Secondary | ICD-10-CM

## 2012-10-31 MED ORDER — ALPRAZOLAM 0.5 MG PO TABS: 0.5000 mg | ORAL_TABLET | Freq: Three times a day (TID) | ORAL | Status: DC

## 2012-10-31 MED ORDER — HYDROCODONE-ACETAMINOPHEN 5-325 MG PO TABS: 1.0000 | ORAL_TABLET | Freq: Four times a day (QID) | ORAL | Status: DC | PRN

## 2012-10-31 NOTE — Patient Instructions (Signed)
Please use the lowest dose of alprazolam that will reasonably control your anxiety

## 2012-10-31 NOTE — Assessment & Plan Note (Signed)
Still limited ability physically PT continues Daughters provide a lot of support

## 2012-10-31 NOTE — Assessment & Plan Note (Addendum)
Paced rhythm protimes weekly at home Daughter will have company send to me from now on  Was 4.9 on Monday Held only 1 day Told daughter to hold 2 more days Then 2.5mg  alternating with 5mg  Recheck on Monday--daughter will call

## 2012-10-31 NOTE — Progress Notes (Signed)
Subjective:    Patient ID: Micheal [name], male    DOB: 1941/08/09, 72 y.o.   MRN: [id number]  HPI Here with daughter--Micheal [name] Had fallen, rebroken left arm and rib fracture In [place] for rehab  Did have a fall since going home-- 10 days ago Standing by recliner and the legs came out and knocked him over Right ankle pain with that Larey Seat onto bookcase and still has hematoma on right back  Walks with walker Daughter brings him food---daughters clean for him Daughter assists with shower Independent with dressing and bathroom  Still happy with the xanax No panic attacks since back on this  No chest pain Has had occasional breathing issues---but nothing persistent Sleeps in recliner. No PND  Daughter checks protime weekly Done through [employer]--will need to have the results sent to me instead of [facility]  Current Outpatient Prescriptions on File Prior to Visit  Medication Sig Dispense Refill  . ALPRAZolam (XANAX) 0.5 MG tablet Take 0.5 mg by mouth 3 (three) times daily. And twice a day as needed for anxiety      . carvedilol (COREG) 3.125 MG tablet Take 3.125 mg by mouth 2 (two) times daily with a meal.      . citalopram (CELEXA) 40 MG tablet Take 40 mg by mouth daily.        . febuxostat (ULORIC) 40 MG tablet Take 80 mg by mouth daily.      . furosemide (LASIX) 40 MG tablet Take 40 mg by mouth every morning. .      . HYDROcodone-acetaminophen (NORCO/VICODIN) 5-325 MG per tablet Take 1 tablet by mouth every 6 (six) hours as needed.      Marland Kitchen lisinopril (PRINIVIL,ZESTRIL) 2.5 MG tablet Take 2.5 mg by mouth daily.      . potassium chloride (K-DUR) 10 MEQ tablet Take 10 mEq by mouth daily.      . pravastatin (PRAVACHOL) 80 MG tablet Take 80 mg by mouth at bedtime.        . sennosides-docusate sodium (SENOKOT-S) 8.6-50 MG tablet Take 2 tablets by mouth 2 (two) times daily as needed.      . warfarin (COUMADIN) 5 MG tablet Take 5 mg by mouth daily. UAD         No Known  Allergies  Past Medical History  Diagnosis Date  . Automatic implantable cardiac defibrillator in situ     St. Jude  . Other left bundle branch block   . Atrial fibrillation   . 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 Chronic systolic heart failure ~2009    EF under 25%  . Hypertension   . Anxiety   . CKD (chronic kidney disease) stage 3, GFR 30-59 ml/min 2009  . Gout, unspecified     Past Surgical History  Procedure Date  . Icd implant     St. Jude Durata; 12/27/07-CHF, afib, LBB, 3 vessel disease  . Fracture of left humerus 2008    Rebroken 12/13    No family history on file.  History   Social History  . Marital Status: Divorced    Spouse Name: N/A    Number of Children: 2  . Years of Education: N/A   Occupational History  . Retired as Tourist information centre manager business   Social History Main Topics  . Smoking status: Former Smoker    Quit date: 09/25/1993  . Smokeless tobacco: Never Used     Comment: quit 15 years ago  .  Alcohol Use: No     Comment: past use but not currently  . Drug Use: Not on file  . Sexually Active: Not on file   Other Topics Concern  . Not on file   Social History Narrative   Divorced2 daughtersNo living [place] daughter [name] as health care POAWould accept resuscitationNot sure about tube feeds   Review of Systems Appetite is fair---variable Weight doesn't really seem different Bowels are okay    Objective:   Physical Exam  Constitutional: He appears well-developed and well-nourished. No distress.  Neck: Normal range of motion. Neck supple. No thyromegaly present.  Cardiovascular: Normal rate, regular rhythm and normal heart sounds.  Exam reveals no gallop.   No murmur heard. Pulmonary/Chest: Effort normal and breath sounds normal. No respiratory distress. He has no wheezes. He has no rales.  Musculoskeletal:       Slight swelling in right ankle--not tender  Lymphadenopathy:    He has no cervical  adenopathy.  Neurological:       Trouble getting out of chair Able to walk stably with rolling walker and sit down properly  Skin:       Moderate sized hematoma on right low back  Psychiatric: He has a normal mood and affect. His behavior is normal.          Assessment & Plan:

## 2012-10-31 NOTE — Assessment & Plan Note (Signed)
Seems to be stable No sig SOB Fluid status okay

## 2012-10-31 NOTE — Assessment & Plan Note (Signed)
Better back on the xanax Discussed using the lowest effective dose (as this can be related to falls)

## 2012-11-05 ENCOUNTER — Telehealth: Payer: Self-pay

## 2012-11-05 ENCOUNTER — Ambulatory Visit (INDEPENDENT_AMBULATORY_CARE_PROVIDER_SITE_OTHER): Payer: Medicare Other | Admitting: General Practice

## 2012-11-05 DIAGNOSIS — Z7901 Long term (current) use of anticoagulants: Secondary | ICD-10-CM

## 2012-11-05 DIAGNOSIS — I4891 Unspecified atrial fibrillation: Secondary | ICD-10-CM

## 2012-11-05 DIAGNOSIS — S20229A Contusion of unspecified back wall of thorax, initial encounter: Secondary | ICD-10-CM | POA: Diagnosis not present

## 2012-11-05 NOTE — Patient Instructions (Signed)
Skip coumadin today and take 1.25 mg on Wednesday and then resume taking 5 mg/2.5mg .  Re-check on Monday 2/17.

## 2012-11-05 NOTE — Telephone Encounter (Signed)
Micheal [name] pts daughter wanted to call PT/INR. Belinda Fisher to call Bailey Mech at [phone] and give Cindy PT/INR and she will advise dosage of med and when pt needs to have next PT/INR. Micheal [name] voiced understanding.

## 2012-11-12 DIAGNOSIS — I4891 Unspecified atrial fibrillation: Secondary | ICD-10-CM | POA: Diagnosis not present

## 2012-11-14 ENCOUNTER — Ambulatory Visit (INDEPENDENT_AMBULATORY_CARE_PROVIDER_SITE_OTHER): Payer: Medicare Other | Admitting: General Practice

## 2012-11-14 DIAGNOSIS — Z7901 Long term (current) use of anticoagulants: Secondary | ICD-10-CM

## 2012-11-14 DIAGNOSIS — I4891 Unspecified atrial fibrillation: Secondary | ICD-10-CM

## 2012-11-14 LAB — POCT INR: INR: 2.4

## 2012-11-14 NOTE — Patient Instructions (Addendum)
Continue taking 5 mg/2.5mg .  Re-check on Thursday 2/27.

## 2012-12-05 ENCOUNTER — Ambulatory Visit (INDEPENDENT_AMBULATORY_CARE_PROVIDER_SITE_OTHER): Payer: Medicare Other | Admitting: General Practice

## 2012-12-05 DIAGNOSIS — Z7901 Long term (current) use of anticoagulants: Secondary | ICD-10-CM

## 2012-12-05 DIAGNOSIS — I4891 Unspecified atrial fibrillation: Secondary | ICD-10-CM

## 2012-12-05 LAB — POCT INR: INR: 3.9

## 2012-12-05 NOTE — Patient Instructions (Signed)
Skip dosage today and tomorrow and then continue taking 5 mg/2.5mg .  Re-check in 10 days.

## 2012-12-11 DIAGNOSIS — I4891 Unspecified atrial fibrillation: Secondary | ICD-10-CM | POA: Diagnosis not present

## 2012-12-12 ENCOUNTER — Other Ambulatory Visit: Payer: Self-pay

## 2012-12-12 LAB — POCT INR: INR: 2.2

## 2012-12-12 NOTE — Telephone Encounter (Addendum)
Micheal [name] pts daughter left v/m requesting written rx for alprazolam 0.5 mg. Pt taking 3 Xanax a day to prevent panic attacks..Please advise. Call when rx ready for pick up. Micheal [name] called back and said can call rx into [facility] [address]. Pt is not out of med.

## 2012-12-13 ENCOUNTER — Ambulatory Visit (INDEPENDENT_AMBULATORY_CARE_PROVIDER_SITE_OTHER): Payer: Medicare Other | Admitting: General Practice

## 2012-12-13 DIAGNOSIS — I4891 Unspecified atrial fibrillation: Secondary | ICD-10-CM

## 2012-12-13 DIAGNOSIS — Z7901 Long term (current) use of anticoagulants: Secondary | ICD-10-CM

## 2012-12-13 MED ORDER — ALPRAZOLAM 0.5 MG PO TABS: 0.5000 mg | ORAL_TABLET | Freq: Three times a day (TID) | ORAL | Status: DC

## 2012-12-13 NOTE — Patient Instructions (Signed)
Continue taking 5 mg/2.5mg .  Re-check in 10 days.

## 2012-12-13 NOTE — Telephone Encounter (Signed)
rx called into pharmacy Left message on daughter's cell phone that pt needs to schedule f/u soon

## 2012-12-13 NOTE — Telephone Encounter (Signed)
Okay #120 x 0 He is due for a follow up--please have them schedule within the next month or so

## 2012-12-18 NOTE — Telephone Encounter (Signed)
[name] called back to ck on status of refill; advised called to [facility] on [address] 12/13/12. [name] said she needs it [facility] to [facility] [address]. I spoke with Dawn pharmacist at [facility] and she will transfer from [address] and ready for pick up in 2 hrs. [name] advised.

## 2012-12-20 DIAGNOSIS — I10 Essential (primary) hypertension: Secondary | ICD-10-CM | POA: Diagnosis not present

## 2012-12-20 DIAGNOSIS — N2581 Secondary hyperparathyroidism of renal origin: Secondary | ICD-10-CM | POA: Diagnosis not present

## 2012-12-20 DIAGNOSIS — R809 Proteinuria, unspecified: Secondary | ICD-10-CM | POA: Diagnosis not present

## 2012-12-25 DIAGNOSIS — Z7901 Long term (current) use of anticoagulants: Secondary | ICD-10-CM | POA: Diagnosis not present

## 2012-12-25 DIAGNOSIS — R0602 Shortness of breath: Secondary | ICD-10-CM | POA: Diagnosis not present

## 2012-12-25 DIAGNOSIS — Z130 Encounter for screening for diseases of the blood and blood-forming organs and certain disorders involving the immune mechanism: Secondary | ICD-10-CM | POA: Diagnosis not present

## 2012-12-25 DIAGNOSIS — M109 Gout, unspecified: Secondary | ICD-10-CM | POA: Diagnosis not present

## 2012-12-25 DIAGNOSIS — E119 Type 2 diabetes mellitus without complications: Secondary | ICD-10-CM | POA: Diagnosis not present

## 2012-12-25 DIAGNOSIS — F341 Dysthymic disorder: Secondary | ICD-10-CM | POA: Diagnosis not present

## 2012-12-25 DIAGNOSIS — E7800 Pure hypercholesterolemia, unspecified: Secondary | ICD-10-CM | POA: Diagnosis not present

## 2012-12-25 DIAGNOSIS — I509 Heart failure, unspecified: Secondary | ICD-10-CM | POA: Diagnosis not present

## 2012-12-25 DIAGNOSIS — Z79899 Other long term (current) drug therapy: Secondary | ICD-10-CM | POA: Diagnosis not present

## 2012-12-25 DIAGNOSIS — G894 Chronic pain syndrome: Secondary | ICD-10-CM | POA: Diagnosis not present

## 2012-12-25 DIAGNOSIS — Z1329 Encounter for screening for other suspected endocrine disorder: Secondary | ICD-10-CM | POA: Diagnosis not present

## 2013-01-02 ENCOUNTER — Ambulatory Visit (INDEPENDENT_AMBULATORY_CARE_PROVIDER_SITE_OTHER): Payer: Medicare Other | Admitting: Internal Medicine

## 2013-01-02 ENCOUNTER — Encounter: Payer: Self-pay | Admitting: Internal Medicine

## 2013-01-02 VITALS — BP 120/70 | HR 75 | Temp 98.1°F | Wt 252.0 lb

## 2013-01-02 DIAGNOSIS — F411 Generalized anxiety disorder: Secondary | ICD-10-CM | POA: Diagnosis not present

## 2013-01-02 DIAGNOSIS — N183 Chronic kidney disease, stage 3 unspecified: Secondary | ICD-10-CM

## 2013-01-02 DIAGNOSIS — F419 Anxiety disorder, unspecified: Secondary | ICD-10-CM

## 2013-01-02 DIAGNOSIS — I4891 Unspecified atrial fibrillation: Secondary | ICD-10-CM | POA: Diagnosis not present

## 2013-01-02 DIAGNOSIS — I5022 Chronic systolic (congestive) heart failure: Secondary | ICD-10-CM

## 2013-01-02 DIAGNOSIS — M109 Gout, unspecified: Secondary | ICD-10-CM

## 2013-01-02 MED ORDER — ALPRAZOLAM 0.5 MG PO TABS: 0.5000 mg | ORAL_TABLET | Freq: Three times a day (TID) | ORAL | Status: DC

## 2013-01-02 NOTE — Progress Notes (Signed)
Subjective:    Patient ID: Micheal [name], male    DOB: 1941-07-20, 72 y.o.   MRN: [id number]  HPI Here with daughter [name]  Doing okay but still has his anxiety Tried cutting back the xanax to bid---felt his panic attacks coming back Needs to keep it up  No recent falls Still lives alone Daughter brings him food and helps him clean, etc Goes out at times with daughter---dog to vet, etc  No palpitations No chest pain Breathing is still limited---dyspnea after short flat walking  Gout has been quiet  Current Outpatient Prescriptions on File Prior to Visit  Medication Sig Dispense Refill  . carvedilol (COREG) 3.125 MG tablet Take 3.125 mg by mouth 2 (two) times daily with a meal.      . citalopram (CELEXA) 40 MG tablet Take 40 mg by mouth daily.        . febuxostat (ULORIC) 40 MG tablet Take 80 mg by mouth daily.      . furosemide (LASIX) 40 MG tablet Take 40 mg by mouth every morning. .      . HYDROcodone-acetaminophen (NORCO/VICODIN) 5-325 MG per tablet Take 1 tablet by mouth every 6 (six) hours as needed.  60 tablet  0  . hydrOXYzine (ATARAX/VISTARIL) 50 MG tablet Take 50 mg by mouth 3 (three) times daily as needed.      . potassium chloride (K-DUR) 10 MEQ tablet Take 10 mEq by mouth daily.      . pravastatin (PRAVACHOL) 80 MG tablet Take 80 mg by mouth at bedtime.        . sennosides-docusate sodium (SENOKOT-S) 8.6-50 MG tablet Take 2 tablets by mouth 2 (two) times daily as needed.      . warfarin (COUMADIN) 5 MG tablet Take 5 mg by mouth as directed.        No current facility-administered medications on file prior to visit.    No Known Allergies  Past Medical History  Diagnosis Date  . Automatic implantable cardiac defibrillator in situ     St. Jude  . Other left bundle branch block   . Atrial fibrillation   . 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 Chronic systolic heart failure ~2009    EF under 25%  . Hypertension   . Anxiety   . CKD  (chronic kidney disease) stage 3, GFR 30-59 ml/min 2009  . Gout, unspecified     Past Surgical History  Procedure Laterality Date  . Icd implant      St. Jude Durata; 12/27/07-CHF, afib, LBB, 3 vessel disease  . Fracture of left humerus  2008    Rebroken 12/13    No family history on file.  History   Social History  . Marital Status: Divorced    Spouse Name: N/A    Number of Children: 2  . Years of Education: N/A   Occupational History  . Retired as Tourist information centre manager business   Social History Main Topics  . Smoking status: Former Smoker    Quit date: 09/25/1993  . Smokeless tobacco: Never Used     Comment: quit 15 years ago  . Alcohol Use: No     Comment: past use but not currently  . Drug Use: Not on file  . Sexually Active: Not on file   Other Topics Concern  . Not on file   Social History Narrative   Divorced   2 daughters   No living will  Requests daughter [name] as health care POA   Would accept resuscitation   Not sure about tube feeds   Review of Systems Appetite is off some Weight fairly stable now--did take increased fluid pills briefly for increased fluid    Objective:   Physical Exam  Constitutional: He appears well-developed and well-nourished. No distress.  Neck: Normal range of motion. Neck supple.  Cardiovascular: Normal rate, regular rhythm and normal heart sounds.  Exam reveals no gallop.   No murmur heard. Rare skips  Pulmonary/Chest: Effort normal and breath sounds normal. No respiratory distress. He has no wheezes. He has no rales.  Abdominal: Soft. There is no tenderness.  Musculoskeletal: He exhibits no edema.  Calves thick but no pitting   Lymphadenopathy:    He has no cervical adenopathy.  Psychiatric: He has a normal mood and affect. His behavior is normal.          Assessment & Plan:

## 2013-01-02 NOTE — Assessment & Plan Note (Signed)
Stable NYHA class 3 Has had some extra furosemide through nephrologist Sounds clear now

## 2013-01-02 NOTE — Assessment & Plan Note (Signed)
Quiet on med Will check labs next time

## 2013-01-02 NOTE — Assessment & Plan Note (Signed)
Controlled with citalopram and regular xanax Unable to wean the xanax

## 2013-01-02 NOTE — Assessment & Plan Note (Signed)
Paced No symptoms of breakthrough On coumadin

## 2013-01-02 NOTE — Assessment & Plan Note (Signed)
Continues with nephrologist 

## 2013-01-08 DIAGNOSIS — I059 Rheumatic mitral valve disease, unspecified: Secondary | ICD-10-CM | POA: Diagnosis not present

## 2013-01-08 DIAGNOSIS — I5021 Acute systolic (congestive) heart failure: Secondary | ICD-10-CM | POA: Diagnosis not present

## 2013-01-08 DIAGNOSIS — N184 Chronic kidney disease, stage 4 (severe): Secondary | ICD-10-CM | POA: Diagnosis not present

## 2013-01-08 DIAGNOSIS — I2510 Atherosclerotic heart disease of native coronary artery without angina pectoris: Secondary | ICD-10-CM | POA: Diagnosis not present

## 2013-01-15 ENCOUNTER — Telehealth: Payer: Self-pay | Admitting: General Practice

## 2013-01-15 NOTE — Telephone Encounter (Signed)
LMOM to call coumadin clinic for INR check.

## 2013-01-16 DIAGNOSIS — I4891 Unspecified atrial fibrillation: Secondary | ICD-10-CM | POA: Diagnosis not present

## 2013-01-25 DIAGNOSIS — H35369 Drusen (degenerative) of macula, unspecified eye: Secondary | ICD-10-CM | POA: Diagnosis not present

## 2013-01-25 DIAGNOSIS — H04129 Dry eye syndrome of unspecified lacrimal gland: Secondary | ICD-10-CM | POA: Diagnosis not present

## 2013-01-25 DIAGNOSIS — H2510 Age-related nuclear cataract, unspecified eye: Secondary | ICD-10-CM | POA: Diagnosis not present

## 2013-01-30 DIAGNOSIS — I4891 Unspecified atrial fibrillation: Secondary | ICD-10-CM | POA: Diagnosis not present

## 2013-02-06 ENCOUNTER — Ambulatory Visit (INDEPENDENT_AMBULATORY_CARE_PROVIDER_SITE_OTHER): Payer: Medicare Other | Admitting: General Practice

## 2013-02-06 DIAGNOSIS — Z7901 Long term (current) use of anticoagulants: Secondary | ICD-10-CM

## 2013-02-06 DIAGNOSIS — I4891 Unspecified atrial fibrillation: Secondary | ICD-10-CM

## 2013-02-06 LAB — POCT INR: INR: 2.7

## 2013-02-06 NOTE — Patient Instructions (Signed)
Continue taking 5 mg/2.5mg .  Re-check in 4 weeks.

## 2013-02-10 DIAGNOSIS — I509 Heart failure, unspecified: Secondary | ICD-10-CM | POA: Diagnosis not present

## 2013-02-18 DIAGNOSIS — I4891 Unspecified atrial fibrillation: Secondary | ICD-10-CM | POA: Diagnosis not present

## 2013-02-26 ENCOUNTER — Other Ambulatory Visit: Payer: Self-pay | Admitting: Internal Medicine

## 2013-02-26 NOTE — Telephone Encounter (Signed)
Okay #60 x 0 

## 2013-02-26 NOTE — Telephone Encounter (Signed)
rx called into pharmacy

## 2013-02-27 ENCOUNTER — Ambulatory Visit (INDEPENDENT_AMBULATORY_CARE_PROVIDER_SITE_OTHER): Payer: Medicare Other | Admitting: General Practice

## 2013-02-27 DIAGNOSIS — I5022 Chronic systolic (congestive) heart failure: Secondary | ICD-10-CM | POA: Diagnosis not present

## 2013-02-27 DIAGNOSIS — I4891 Unspecified atrial fibrillation: Secondary | ICD-10-CM

## 2013-02-27 DIAGNOSIS — Z7901 Long term (current) use of anticoagulants: Secondary | ICD-10-CM

## 2013-02-27 LAB — POCT INR: INR: 2

## 2013-02-27 NOTE — Patient Instructions (Signed)
Continue taking 5 mg/2.5mg.  Re-check in 4 weeks.  

## 2013-03-18 DIAGNOSIS — R9389 Abnormal findings on diagnostic imaging of other specified body structures: Secondary | ICD-10-CM | POA: Diagnosis not present

## 2013-03-18 DIAGNOSIS — I2510 Atherosclerotic heart disease of native coronary artery without angina pectoris: Secondary | ICD-10-CM | POA: Diagnosis not present

## 2013-03-18 DIAGNOSIS — I5022 Chronic systolic (congestive) heart failure: Secondary | ICD-10-CM | POA: Diagnosis not present

## 2013-03-18 DIAGNOSIS — R9430 Abnormal result of cardiovascular function study, unspecified: Secondary | ICD-10-CM | POA: Diagnosis not present

## 2013-03-19 DIAGNOSIS — I4891 Unspecified atrial fibrillation: Secondary | ICD-10-CM | POA: Diagnosis not present

## 2013-04-11 ENCOUNTER — Ambulatory Visit (INDEPENDENT_AMBULATORY_CARE_PROVIDER_SITE_OTHER): Payer: Medicare Other | Admitting: Family Medicine

## 2013-04-11 DIAGNOSIS — I4891 Unspecified atrial fibrillation: Secondary | ICD-10-CM

## 2013-04-11 DIAGNOSIS — Z7901 Long term (current) use of anticoagulants: Secondary | ICD-10-CM

## 2013-04-11 NOTE — Patient Instructions (Signed)
Pt held dose on 7/17.  Continue taking 5 mg/2.5mg .  Re-check in 4 weeks.  Instructions given to daughter, [name].

## 2013-04-15 ENCOUNTER — Inpatient Hospital Stay: Payer: Self-pay | Admitting: Internal Medicine

## 2013-04-15 DIAGNOSIS — N183 Chronic kidney disease, stage 3 unspecified: Secondary | ICD-10-CM | POA: Diagnosis present

## 2013-04-15 DIAGNOSIS — Z87891 Personal history of nicotine dependence: Secondary | ICD-10-CM | POA: Diagnosis not present

## 2013-04-15 DIAGNOSIS — R809 Proteinuria, unspecified: Secondary | ICD-10-CM | POA: Diagnosis not present

## 2013-04-15 DIAGNOSIS — F0390 Unspecified dementia without behavioral disturbance: Secondary | ICD-10-CM | POA: Diagnosis present

## 2013-04-15 DIAGNOSIS — F329 Major depressive disorder, single episode, unspecified: Secondary | ICD-10-CM | POA: Diagnosis present

## 2013-04-15 DIAGNOSIS — I5022 Chronic systolic (congestive) heart failure: Secondary | ICD-10-CM | POA: Diagnosis not present

## 2013-04-15 DIAGNOSIS — I5021 Acute systolic (congestive) heart failure: Secondary | ICD-10-CM | POA: Diagnosis not present

## 2013-04-15 DIAGNOSIS — E785 Hyperlipidemia, unspecified: Secondary | ICD-10-CM | POA: Diagnosis not present

## 2013-04-15 DIAGNOSIS — R079 Chest pain, unspecified: Secondary | ICD-10-CM | POA: Diagnosis not present

## 2013-04-15 DIAGNOSIS — I129 Hypertensive chronic kidney disease with stage 1 through stage 4 chronic kidney disease, or unspecified chronic kidney disease: Secondary | ICD-10-CM | POA: Diagnosis present

## 2013-04-15 DIAGNOSIS — N189 Chronic kidney disease, unspecified: Secondary | ICD-10-CM | POA: Diagnosis not present

## 2013-04-15 DIAGNOSIS — N179 Acute kidney failure, unspecified: Secondary | ICD-10-CM | POA: Diagnosis not present

## 2013-04-15 DIAGNOSIS — M109 Gout, unspecified: Secondary | ICD-10-CM | POA: Diagnosis present

## 2013-04-15 DIAGNOSIS — G8929 Other chronic pain: Secondary | ICD-10-CM | POA: Diagnosis present

## 2013-04-15 DIAGNOSIS — Z6838 Body mass index (BMI) 38.0-38.9, adult: Secondary | ICD-10-CM | POA: Diagnosis not present

## 2013-04-15 DIAGNOSIS — I5023 Acute on chronic systolic (congestive) heart failure: Secondary | ICD-10-CM | POA: Diagnosis present

## 2013-04-15 DIAGNOSIS — R0602 Shortness of breath: Secondary | ICD-10-CM | POA: Diagnosis not present

## 2013-04-15 DIAGNOSIS — I10 Essential (primary) hypertension: Secondary | ICD-10-CM | POA: Diagnosis not present

## 2013-04-15 DIAGNOSIS — Z95810 Presence of automatic (implantable) cardiac defibrillator: Secondary | ICD-10-CM | POA: Diagnosis not present

## 2013-04-15 DIAGNOSIS — E119 Type 2 diabetes mellitus without complications: Secondary | ICD-10-CM | POA: Diagnosis present

## 2013-04-15 DIAGNOSIS — M545 Low back pain: Secondary | ICD-10-CM | POA: Diagnosis present

## 2013-04-15 DIAGNOSIS — I509 Heart failure, unspecified: Secondary | ICD-10-CM | POA: Diagnosis not present

## 2013-04-15 DIAGNOSIS — R609 Edema, unspecified: Secondary | ICD-10-CM | POA: Diagnosis not present

## 2013-04-15 DIAGNOSIS — I4891 Unspecified atrial fibrillation: Secondary | ICD-10-CM | POA: Diagnosis present

## 2013-04-15 DIAGNOSIS — I428 Other cardiomyopathies: Secondary | ICD-10-CM | POA: Diagnosis not present

## 2013-04-15 DIAGNOSIS — Z7901 Long term (current) use of anticoagulants: Secondary | ICD-10-CM | POA: Diagnosis not present

## 2013-04-15 DIAGNOSIS — J9620 Acute and chronic respiratory failure, unspecified whether with hypoxia or hypercapnia: Secondary | ICD-10-CM | POA: Diagnosis not present

## 2013-04-15 LAB — URINALYSIS, COMPLETE
Bacteria: NONE SEEN
Bilirubin,UR: NEGATIVE
Glucose,UR: NEGATIVE mg/dL (ref 0–75)
Ketone: NEGATIVE
Nitrite: NEGATIVE
Ph: 7 (ref 4.5–8.0)
Protein: >=500
Specific Gravity: 1.011 (ref 1.003–1.030)
Squamous Epithelial: 1
WBC UR: 2 /HPF (ref 0–5)

## 2013-04-15 LAB — PRO B NATRIURETIC PEPTIDE: B-Type Natriuretic Peptide: 11521 pg/mL — ABNORMAL HIGH (ref 0–125)

## 2013-04-15 LAB — CBC
HGB: 13.5 g/dL (ref 13.0–18.0)
MCH: 28.3 pg (ref 26.0–34.0)
MCHC: 32 g/dL (ref 32.0–36.0)
MCV: 89 fL (ref 80–100)
RBC: 4.78 10*6/uL (ref 4.40–5.90)
RDW: 18.6 % — ABNORMAL HIGH (ref 11.5–14.5)

## 2013-04-15 LAB — COMPREHENSIVE METABOLIC PANEL
Alkaline Phosphatase: 107 U/L (ref 50–136)
Anion Gap: 0 — ABNORMAL LOW (ref 7–16)
BUN: 21 mg/dL — ABNORMAL HIGH (ref 7–18)
Calcium, Total: 9 mg/dL (ref 8.5–10.1)
Co2: 38 mmol/L — ABNORMAL HIGH (ref 21–32)
Creatinine: 1.82 mg/dL — ABNORMAL HIGH (ref 0.60–1.30)
EGFR (African American): 43 — ABNORMAL LOW
EGFR (Non-African Amer.): 37 — ABNORMAL LOW
Glucose: 114 mg/dL — ABNORMAL HIGH (ref 65–99)
Osmolality: 278 (ref 275–301)
Potassium: 4.8 mmol/L (ref 3.5–5.1)
SGOT(AST): 23 U/L (ref 15–37)
SGPT (ALT): 16 U/L (ref 12–78)
Total Protein: 7.1 g/dL (ref 6.4–8.2)

## 2013-04-15 LAB — CK TOTAL AND CKMB (NOT AT ARMC): CK, Total: 31 U/L — ABNORMAL LOW (ref 35–232)

## 2013-04-15 LAB — PROTIME-INR
INR: 1.9
Prothrombin Time: 21.5 secs — ABNORMAL HIGH (ref 11.5–14.7)

## 2013-04-15 LAB — TROPONIN I: Troponin-I: 0.04 ng/mL

## 2013-04-16 LAB — LIPID PANEL
Cholesterol: 97 mg/dL (ref 0–200)
HDL Cholesterol: 40 mg/dL (ref 40–60)
VLDL Cholesterol, Calc: 14 mg/dL (ref 5–40)

## 2013-04-16 LAB — CBC WITH DIFFERENTIAL/PLATELET
Basophil %: 0.9 %
Eosinophil #: 0.2 10*3/uL (ref 0.0–0.7)
Eosinophil %: 3 %
HCT: 43.2 % (ref 40.0–52.0)
Lymphocyte %: 11.7 %
MCH: 28.1 pg (ref 26.0–34.0)
MCHC: 31.8 g/dL — ABNORMAL LOW (ref 32.0–36.0)
MCV: 89 fL (ref 80–100)
Neutrophil %: 73.4 %
Platelet: 171 10*3/uL (ref 150–440)
RBC: 4.88 10*6/uL (ref 4.40–5.90)
RDW: 18 % — ABNORMAL HIGH (ref 11.5–14.5)
WBC: 6.8 10*3/uL (ref 3.8–10.6)

## 2013-04-16 LAB — BASIC METABOLIC PANEL
Anion Gap: 1 — ABNORMAL LOW (ref 7–16)
BUN: 23 mg/dL — ABNORMAL HIGH (ref 7–18)
Chloride: 96 mmol/L — ABNORMAL LOW (ref 98–107)
Co2: 38 mmol/L — ABNORMAL HIGH (ref 21–32)
EGFR (African American): 42 — ABNORMAL LOW
EGFR (Non-African Amer.): 36 — ABNORMAL LOW
Glucose: 112 mg/dL — ABNORMAL HIGH (ref 65–99)
Potassium: 4.1 mmol/L (ref 3.5–5.1)
Sodium: 135 mmol/L — ABNORMAL LOW (ref 136–145)

## 2013-04-16 LAB — TROPONIN I: Troponin-I: 0.05 ng/mL

## 2013-04-16 LAB — TSH: Thyroid Stimulating Horm: 1.98 u[IU]/mL

## 2013-04-16 LAB — CK TOTAL AND CKMB (NOT AT ARMC)
CK, Total: 31 U/L — ABNORMAL LOW (ref 35–232)
CK-MB: 1.3 ng/mL (ref 0.5–3.6)

## 2013-04-16 LAB — PROTIME-INR
INR: 1.9
Prothrombin Time: 21.3 secs — ABNORMAL HIGH (ref 11.5–14.7)

## 2013-04-17 DIAGNOSIS — I4891 Unspecified atrial fibrillation: Secondary | ICD-10-CM | POA: Diagnosis not present

## 2013-04-17 LAB — BASIC METABOLIC PANEL
Chloride: 93 mmol/L — ABNORMAL LOW (ref 98–107)
Creatinine: 2 mg/dL — ABNORMAL HIGH (ref 0.60–1.30)
EGFR (African American): 38 — ABNORMAL LOW
EGFR (Non-African Amer.): 33 — ABNORMAL LOW
Glucose: 113 mg/dL — ABNORMAL HIGH (ref 65–99)
Potassium: 4 mmol/L (ref 3.5–5.1)
Sodium: 136 mmol/L (ref 136–145)

## 2013-04-18 LAB — BASIC METABOLIC PANEL
Anion Gap: 3 — ABNORMAL LOW (ref 7–16)
BUN: 31 mg/dL — ABNORMAL HIGH (ref 7–18)
Calcium, Total: 9.1 mg/dL (ref 8.5–10.1)
Chloride: 96 mmol/L — ABNORMAL LOW (ref 98–107)
Creatinine: 1.92 mg/dL — ABNORMAL HIGH (ref 0.60–1.30)
Glucose: 114 mg/dL — ABNORMAL HIGH (ref 65–99)
Osmolality: 279 (ref 275–301)
Potassium: 3.9 mmol/L (ref 3.5–5.1)
Sodium: 136 mmol/L (ref 136–145)

## 2013-04-18 LAB — PROTIME-INR
INR: 1.6
Prothrombin Time: 18.6 secs — ABNORMAL HIGH (ref 11.5–14.7)

## 2013-04-24 ENCOUNTER — Telehealth: Payer: Self-pay | Admitting: Internal Medicine

## 2013-04-24 DIAGNOSIS — I10 Essential (primary) hypertension: Secondary | ICD-10-CM | POA: Diagnosis not present

## 2013-04-24 DIAGNOSIS — N2581 Secondary hyperparathyroidism of renal origin: Secondary | ICD-10-CM | POA: Diagnosis not present

## 2013-04-24 DIAGNOSIS — R809 Proteinuria, unspecified: Secondary | ICD-10-CM | POA: Diagnosis not present

## 2013-04-24 NOTE — Telephone Encounter (Signed)
Patient had a f/u appointment from [facility]  scheduled with you tomorrow.  His daughter called to reschedule the appointment to 05/08/13.  Patient was too tired from seeing Dr.Lateef today to come and see you tomorrow.

## 2013-04-24 NOTE — Telephone Encounter (Signed)
That is okay.

## 2013-04-25 ENCOUNTER — Ambulatory Visit: Payer: Medicare Other | Admitting: Internal Medicine

## 2013-05-01 DIAGNOSIS — I5023 Acute on chronic systolic (congestive) heart failure: Secondary | ICD-10-CM | POA: Diagnosis not present

## 2013-05-01 DIAGNOSIS — I519 Heart disease, unspecified: Secondary | ICD-10-CM | POA: Diagnosis not present

## 2013-05-01 DIAGNOSIS — I059 Rheumatic mitral valve disease, unspecified: Secondary | ICD-10-CM | POA: Diagnosis not present

## 2013-05-01 DIAGNOSIS — N184 Chronic kidney disease, stage 4 (severe): Secondary | ICD-10-CM | POA: Diagnosis not present

## 2013-05-06 DIAGNOSIS — Z7901 Long term (current) use of anticoagulants: Secondary | ICD-10-CM | POA: Diagnosis not present

## 2013-05-06 DIAGNOSIS — M549 Dorsalgia, unspecified: Secondary | ICD-10-CM | POA: Diagnosis not present

## 2013-05-06 DIAGNOSIS — G894 Chronic pain syndrome: Secondary | ICD-10-CM | POA: Diagnosis not present

## 2013-05-08 ENCOUNTER — Ambulatory Visit: Payer: Medicare Other | Admitting: Internal Medicine

## 2013-05-08 ENCOUNTER — Encounter: Payer: Self-pay | Admitting: Internal Medicine

## 2013-05-08 ENCOUNTER — Encounter: Payer: Self-pay | Admitting: Radiology

## 2013-05-08 ENCOUNTER — Ambulatory Visit (INDEPENDENT_AMBULATORY_CARE_PROVIDER_SITE_OTHER): Payer: Medicare Other | Admitting: Internal Medicine

## 2013-05-08 VITALS — BP 120/80 | HR 75 | Temp 97.8°F | Wt 237.0 lb

## 2013-05-08 DIAGNOSIS — I4891 Unspecified atrial fibrillation: Secondary | ICD-10-CM | POA: Diagnosis not present

## 2013-05-08 DIAGNOSIS — F419 Anxiety disorder, unspecified: Secondary | ICD-10-CM

## 2013-05-08 DIAGNOSIS — I5022 Chronic systolic (congestive) heart failure: Secondary | ICD-10-CM

## 2013-05-08 DIAGNOSIS — N259 Disorder resulting from impaired renal tubular function, unspecified: Secondary | ICD-10-CM

## 2013-05-08 DIAGNOSIS — F411 Generalized anxiety disorder: Secondary | ICD-10-CM | POA: Diagnosis not present

## 2013-05-08 MED ORDER — FUROSEMIDE 40 MG PO TABS: 40.0000 mg | ORAL_TABLET | ORAL | Status: DC

## 2013-05-08 MED ORDER — CARVEDILOL 3.125 MG PO TABS: 3.1250 mg | ORAL_TABLET | Freq: Two times a day (BID) | ORAL | Status: DC

## 2013-05-08 MED ORDER — LISINOPRIL 5 MG PO TABS: 5.0000 mg | ORAL_TABLET | Freq: Every day | ORAL | Status: DC

## 2013-05-08 MED ORDER — CITALOPRAM HYDROBROMIDE 40 MG PO TABS: 40.0000 mg | ORAL_TABLET | Freq: Every day | ORAL | Status: DC

## 2013-05-08 MED ORDER — WARFARIN SODIUM 5 MG PO TABS: 5.0000 mg | ORAL_TABLET | ORAL | Status: DC

## 2013-05-08 MED ORDER — CYCLOBENZAPRINE HCL 5 MG PO TABS: 5.0000 mg | ORAL_TABLET | Freq: Two times a day (BID) | ORAL | Status: DC | PRN

## 2013-05-08 MED ORDER — METOLAZONE 5 MG PO TABS: 5.0000 mg | ORAL_TABLET | Freq: Two times a day (BID) | ORAL | Status: DC

## 2013-05-08 MED ORDER — POTASSIUM CHLORIDE ER 10 MEQ PO TBCR: 10.0000 meq | EXTENDED_RELEASE_TABLET | Freq: Every day | ORAL | Status: DC

## 2013-05-08 MED ORDER — FEBUXOSTAT 40 MG PO TABS: 80.0000 mg | ORAL_TABLET | Freq: Every day | ORAL | Status: DC

## 2013-05-08 MED ORDER — PRAVASTATIN SODIUM 80 MG PO TABS: 80.0000 mg | ORAL_TABLET | Freq: Every day | ORAL | Status: DC

## 2013-05-08 MED ORDER — HYDROXYZINE HCL 50 MG PO TABS: 50.0000 mg | ORAL_TABLET | Freq: Three times a day (TID) | ORAL | Status: DC | PRN

## 2013-05-08 NOTE — Assessment & Plan Note (Signed)
Obvious tension with CHF Close to dry weight now Will check labs now

## 2013-05-08 NOTE — Assessment & Plan Note (Signed)
Better now Monitoring weight daily He is watching salt---no longer adding

## 2013-05-08 NOTE — Assessment & Plan Note (Signed)
Paced Continues on the coumadin 

## 2013-05-08 NOTE — Assessment & Plan Note (Signed)
Controlled with meds Uses the xanax daily

## 2013-05-08 NOTE — Addendum Note (Signed)
Addended by: Sueanne Margarita on: 05/08/2013 04:38 PM   Modules accepted: Orders

## 2013-05-08 NOTE — Patient Instructions (Signed)
If your weight is over 242# at home, call for instructions about increased fluid medications.

## 2013-05-08 NOTE — Progress Notes (Signed)
Subjective:    Patient ID: Micheal [name], male    DOB: 07/31/1941, 72 y.o.   MRN: [id number]  HPI Here with daughter Micheal [name] Admitted with CHF exacerbation Fluid had come on quickly and then he got dyspnea He did try increasing the fluid pills per Dr Lateef---but have to be careful   Weighing daily Was 40-50# more on admission 3 weeks ago 238# on scale at home this morning Breathing is okay now No chest pain No palpitations Edema mostly gone  Anxiety mostly controlled Needs the xanax regularly-- takes 3 regularly  Current Outpatient Prescriptions on File Prior to Visit  Medication Sig Dispense Refill  . ALPRAZolam (XANAX) 0.5 MG tablet Take 1 tablet (0.5 mg total) by mouth 3 (three) times daily.  90 tablet  0  . carvedilol (COREG) 3.125 MG tablet Take 3.125 mg by mouth 2 (two) times daily with a meal.      . citalopram (CELEXA) 40 MG tablet Take 40 mg by mouth daily.        . febuxostat (ULORIC) 40 MG tablet Take 80 mg by mouth daily.      . furosemide (LASIX) 40 MG tablet Take 40 mg by mouth every morning. .      . hydrOXYzine (ATARAX/VISTARIL) 50 MG tablet Take 50 mg by mouth 3 (three) times daily as needed.      Marland Kitchen lisinopril (PRINIVIL,ZESTRIL) 5 MG tablet Take 5 mg by mouth daily.      . potassium chloride (K-DUR) 10 MEQ tablet Take 10 mEq by mouth daily.      . pravastatin (PRAVACHOL) 80 MG tablet Take 80 mg by mouth at bedtime.        . sennosides-docusate sodium (SENOKOT-S) 8.6-50 MG tablet Take 2 tablets by mouth 2 (two) times daily as needed.      . warfarin (COUMADIN) 5 MG tablet Take 5 mg by mouth as directed.        No current facility-administered medications on file prior to visit.    No Known Allergies  Past Medical History  Diagnosis Date  . Automatic implantable cardiac defibrillator in situ     St. Jude  . Other left bundle branch block   . Atrial fibrillation   . 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 Chronic systolic heart  failure ~2009    EF under 25%  . Hypertension   . Anxiety   . CKD (chronic kidney disease) stage 3, GFR 30-59 ml/min 2009  . Gout, unspecified     Past Surgical History  Procedure Laterality Date  . Icd implant      St. Jude Durata; 12/27/07-CHF, afib, LBB, 3 vessel disease  . Fracture of left humerus  2008    Rebroken 12/13    No family history on file.  History   Social History  . Marital Status: Divorced    Spouse Name: N/A    Number of Children: 2  . Years of Education: N/A   Occupational History  . Retired as Tourist information centre manager business   Social History Main Topics  . Smoking status: Former Smoker    Quit date: 09/25/1993  . Smokeless tobacco: Never Used     Comment: quit 15 years ago  . Alcohol Use: No     Comment: past use but not currently  . Drug Use: Not on file  . Sexual Activity: Not on file   Other Topics Concern  . Not on file  Social History Narrative   Divorced   2 daughters   No living will   Requests daughter Micheal [name] as health care POA   Would accept resuscitation   Not sure about tube feeds   Review of Systems Appetite is okay--variable. Tends to fill up easily. Sleeping in recliner--though has hospital bed    Objective:   Physical Exam  Constitutional: He appears well-developed. No distress.  Neck: Normal range of motion. Neck supple.  Cardiovascular: Normal rate, regular rhythm and normal heart sounds.  Exam reveals no gallop.   No murmur heard. Pulmonary/Chest: Effort normal and breath sounds normal. No respiratory distress. He has no wheezes. He has no rales.  No dullness  Musculoskeletal: He exhibits edema.  Trace foot edema  Lymphadenopathy:    He has no cervical adenopathy.  Psychiatric: He has a normal mood and affect. His behavior is normal.      Assessment & Plan:

## 2013-05-09 ENCOUNTER — Other Ambulatory Visit: Payer: Self-pay | Admitting: *Deleted

## 2013-05-09 LAB — BASIC METABOLIC PANEL
BUN: 43 mg/dL — ABNORMAL HIGH (ref 6–23)
Creatinine, Ser: 2.1 mg/dL — ABNORMAL HIGH (ref 0.4–1.5)
GFR: 34.24 mL/min — ABNORMAL LOW (ref >60.00)
Potassium: 3.8 mEq/L (ref 3.5–5.1)

## 2013-05-09 LAB — CBC WITH DIFFERENTIAL/PLATELET
Eosinophils Relative: 4.4 % (ref 0.0–5.0)
HCT: 43.8 % (ref 39.0–52.0)
Lymphocytes Relative: 19.2 % (ref 12.0–46.0)
Monocytes Relative: 11.1 % (ref 3.0–12.0)
Neutrophils Relative %: 65 % (ref 43.0–77.0)
Platelets: 187 10*3/uL (ref 150.0–400.0)
WBC: 6.8 10*3/uL (ref 4.5–10.5)

## 2013-05-09 MED ORDER — ALPRAZOLAM 0.5 MG PO TABS: 0.5000 mg | ORAL_TABLET | Freq: Three times a day (TID) | ORAL | Status: DC

## 2013-05-09 MED ORDER — HYDROCODONE-ACETAMINOPHEN 10-325 MG PO TABS: 1.0000 | ORAL_TABLET | Freq: Four times a day (QID) | ORAL | Status: DC | PRN

## 2013-05-09 NOTE — Telephone Encounter (Signed)
rx called into pharmacy

## 2013-05-09 NOTE — Telephone Encounter (Signed)
Okay #90 x 0 for alprazolam #30 x 0 for the hydrocodone

## 2013-05-13 DIAGNOSIS — Z79899 Other long term (current) drug therapy: Secondary | ICD-10-CM | POA: Diagnosis not present

## 2013-05-22 ENCOUNTER — Encounter: Payer: Self-pay | Admitting: Internal Medicine

## 2013-05-27 ENCOUNTER — Telehealth: Payer: Self-pay | Admitting: Family Medicine

## 2013-05-27 DIAGNOSIS — I4891 Unspecified atrial fibrillation: Secondary | ICD-10-CM | POA: Diagnosis not present

## 2013-05-27 NOTE — Telephone Encounter (Signed)
Please check on him today

## 2013-05-27 NOTE — Telephone Encounter (Signed)
[facility] Triage Call Report Triage Record Num: [id number] Operator: Judeen Hammans Patient Name: Micheal [name] Call Date & Time: 05/25/2013 10:13:46PM Patient Phone: PCP: Tillman Abide Patient Gender: Male PCP Fax : [phone] Patient DOB: 13-Feb-1941 Practice Name: [facility] Reason for Call: Caller: [name]/daughter; PCP: Tillman Abide [facility]); CB#: [phone]; Call regarding Retaining fluid; has gained 7 lbs in 24 hrs. Onset 8/29. Afebrile. Fluid is in legs - no shortness of breath. Not feeling good the past few days - feels like he's coming down with something- and hurting in shoulder area. Denies chest pain. All emergent sxs ruled out per Edema, Atraumatic Guideline except for "Edema newly worse than usual pattern or newly developed in last 12 hours." Advised to be seen within 24 hours per disposition. Will go to either Urgent Care or ED tomorrow 9/1 (Labor Day). Care advice given for interim. Protocol(s) Used: Edema, Atraumatic Recommended Outcome per Protocol: See Provider within 24 hours Reason for Outcome: Edema newly worse than usual pattern OR newly developed in last 12 hours Care Advice: ~ Call provider if symptoms worsen or new symptoms develop. Go to ED IMMEDIATELY if developing increased shortness of breath, continuous cough, worsening fatigue, or unable to perform ADLs. ~ ~ List, or take, all current prescription(s), nonprescription or alternative medication(s) to provider for evaluation. LEG CARE: - Avoid prolonged sitting or standing; take a break to move around every hour or so. - Keep legs raised when sitting, resting or sleeping; when possible raise legs above level of the heart for 20 -30 minutes. - Flex and extend ankles for 10-12 repetitions every hour if sitting. - Do not cross your legs. - Wear loose, non-restrictive clothing, especially around waist, groin area and legs. - Consider using support hose if recommended by your  provider. ~ 05/25/2013 10:26:28PM Page 1 of 1 [id number]

## 2013-05-29 ENCOUNTER — Ambulatory Visit (INDEPENDENT_AMBULATORY_CARE_PROVIDER_SITE_OTHER): Payer: Medicare Other | Admitting: Family Medicine

## 2013-05-29 ENCOUNTER — Other Ambulatory Visit: Payer: Self-pay | Admitting: Family Medicine

## 2013-05-29 DIAGNOSIS — Z7901 Long term (current) use of anticoagulants: Secondary | ICD-10-CM

## 2013-05-29 DIAGNOSIS — I4891 Unspecified atrial fibrillation: Secondary | ICD-10-CM

## 2013-05-29 LAB — POCT INR: INR: 1.8

## 2013-05-29 MED ORDER — WARFARIN SODIUM 1 MG PO TABS: ORAL_TABLET | ORAL | Status: DC

## 2013-05-29 NOTE — Telephone Encounter (Signed)
Spoke with daughter and pt was dehydrated and he feels better and the weight is back down.

## 2013-05-29 NOTE — Patient Instructions (Signed)
Pt INR 1.7 last week and 1.8 this week.  Change dose to alternate 5mg  and 3mg .  Re-check in 1 week.  Instructions given to daughter, [name].

## 2013-06-03 DIAGNOSIS — I10 Essential (primary) hypertension: Secondary | ICD-10-CM | POA: Diagnosis not present

## 2013-06-03 DIAGNOSIS — N2581 Secondary hyperparathyroidism of renal origin: Secondary | ICD-10-CM | POA: Diagnosis not present

## 2013-06-03 DIAGNOSIS — R809 Proteinuria, unspecified: Secondary | ICD-10-CM | POA: Diagnosis not present

## 2013-06-05 ENCOUNTER — Encounter: Payer: Self-pay | Admitting: Internal Medicine

## 2013-06-05 ENCOUNTER — Ambulatory Visit (INDEPENDENT_AMBULATORY_CARE_PROVIDER_SITE_OTHER): Payer: Medicare Other | Admitting: Internal Medicine

## 2013-06-05 VITALS — BP 100/68 | HR 68 | Temp 98.2°F | Wt 234.0 lb

## 2013-06-05 DIAGNOSIS — I5022 Chronic systolic (congestive) heart failure: Secondary | ICD-10-CM

## 2013-06-05 DIAGNOSIS — F411 Generalized anxiety disorder: Secondary | ICD-10-CM | POA: Diagnosis not present

## 2013-06-05 DIAGNOSIS — I4891 Unspecified atrial fibrillation: Secondary | ICD-10-CM

## 2013-06-05 DIAGNOSIS — F419 Anxiety disorder, unspecified: Secondary | ICD-10-CM

## 2013-06-05 DIAGNOSIS — Z23 Encounter for immunization: Secondary | ICD-10-CM

## 2013-06-05 NOTE — Assessment & Plan Note (Signed)
Weight stable Doing okay on current regimen

## 2013-06-05 NOTE — Assessment & Plan Note (Signed)
Severe but controlled on the alprazolam and citalopram

## 2013-06-05 NOTE — Progress Notes (Signed)
Subjective:    Patient ID: Micheal [name], male    DOB: 04/29/1941, 72 y.o.   MRN: [id number]  HPI Here with daughter Doing well  Does keep up with his weights Did need to call in by Labor Day--may have diuresed to his dry weight Backed off the metolazone and he felt better  Breathing is better with the fluid staying off Doing home exercises Can walk in house and brief spells  No chest pain No palpitations  Anxiety is okay Still uses the alprazolam regularly and that controls the "heebee jeebies"  Current Outpatient Prescriptions on File Prior to Visit  Medication Sig Dispense Refill  . cyclobenzaprine (FLEXERIL) 5 MG tablet Take 1 tablet (5 mg total) by mouth 2 (two) times daily as needed for muscle spasms.  30 tablet  0  . hydrOXYzine (ATARAX/VISTARIL) 50 MG tablet Take 1 tablet (50 mg total) by mouth 3 (three) times daily as needed.  270 tablet  3  . potassium chloride (K-DUR) 10 MEQ tablet Take 1 tablet (10 mEq total) by mouth daily.  90 tablet  3  . sennosides-docusate sodium (SENOKOT-S) 8.6-50 MG tablet Take 2 tablets by mouth 2 (two) times daily as needed.      . warfarin (COUMADIN) 1 MG tablet Take 1/2 tablet every other day as directed by [facility] Clinic.  30 tablet  2   No current facility-administered medications on file prior to visit.    No Known Allergies  Past Medical History  Diagnosis Date  . Automatic implantable cardiac defibrillator in situ     St. Jude  . Other left bundle branch block   . Atrial fibrillation   . 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 Chronic systolic heart failure ~2009    EF under 25%  . Hypertension   . Anxiety   . CKD (chronic kidney disease) stage 3, GFR 30-59 ml/min 2009  . Gout, unspecified     Past Surgical History  Procedure Laterality Date  . Icd implant      St. Jude Durata; 12/27/07-CHF, afib, LBB, 3 vessel disease  . Fracture of left humerus  2008    Rebroken 12/13    No family history  on file.  History   Social History  . Marital Status: Divorced    Spouse Name: N/A    Number of Children: 2  . Years of Education: N/A   Occupational History  . Retired as Tourist information centre manager business   Social History Main Topics  . Smoking status: Former Smoker    Quit date: 09/25/1993  . Smokeless tobacco: Never Used     Comment: quit 15 years ago  . Alcohol Use: No     Comment: past use but not currently  . Drug Use: Not on file  . Sexual Activity: Not on file   Other Topics Concern  . Not on file   Social History Narrative   Divorced   2 daughters   No living will   Requests daughter [name] as health care POA   Would accept resuscitation   Not sure about tube feeds   Review of Systems Weight down 3#. Home weight was 231# Appetite is fine Generally sleeps okay---still in recliner    Objective:   Physical Exam  Constitutional: He appears well-developed. No distress.  Neck: Normal range of motion.  Cardiovascular: Normal rate, regular rhythm and normal heart sounds.  Exam reveals no gallop.   No murmur heard.  Rare extra beat  Pulmonary/Chest: Effort normal. No respiratory distress. He has no wheezes. He has rales.  Faint bibasilar crackles  Musculoskeletal: He exhibits edema.  Trace edema  Lymphadenopathy:    He has no cervical adenopathy.  Psychiatric: He has a normal mood and affect. His behavior is normal.          Assessment & Plan:

## 2013-06-05 NOTE — Assessment & Plan Note (Signed)
Paced On coumadin 

## 2013-06-06 NOTE — Addendum Note (Signed)
Addended by: Sueanne Margarita on: 06/06/2013 12:11 PM   Modules accepted: Orders

## 2013-06-16 DIAGNOSIS — N2581 Secondary hyperparathyroidism of renal origin: Secondary | ICD-10-CM | POA: Diagnosis not present

## 2013-06-24 DIAGNOSIS — I4891 Unspecified atrial fibrillation: Secondary | ICD-10-CM | POA: Diagnosis not present

## 2013-07-01 DIAGNOSIS — I4891 Unspecified atrial fibrillation: Secondary | ICD-10-CM | POA: Diagnosis not present

## 2013-07-29 DIAGNOSIS — I4891 Unspecified atrial fibrillation: Secondary | ICD-10-CM | POA: Diagnosis not present

## 2013-08-25 ENCOUNTER — Other Ambulatory Visit: Payer: Self-pay | Admitting: Internal Medicine

## 2013-08-25 NOTE — Telephone Encounter (Signed)
[name] request rx hydrocodone apap. Call when ready for pick up.

## 2013-08-25 NOTE — Telephone Encounter (Signed)
Pt will be out of his hydrocodone before next week.  He needs a refill.

## 2013-08-26 MED ORDER — HYDROCODONE-ACETAMINOPHEN 10-325 MG PO TABS: 1.0000 | ORAL_TABLET | Freq: Four times a day (QID) | ORAL | Status: DC | PRN

## 2013-08-26 NOTE — Telephone Encounter (Signed)
Patient's daughter notified that script is up front and ready for pickup.

## 2013-09-04 ENCOUNTER — Encounter: Payer: Self-pay | Admitting: Internal Medicine

## 2013-09-04 ENCOUNTER — Ambulatory Visit (INDEPENDENT_AMBULATORY_CARE_PROVIDER_SITE_OTHER): Payer: Medicare Other | Admitting: Internal Medicine

## 2013-09-04 VITALS — BP 110/78 | HR 75 | Temp 98.5°F | Wt 241.0 lb

## 2013-09-04 DIAGNOSIS — M109 Gout, unspecified: Secondary | ICD-10-CM

## 2013-09-04 DIAGNOSIS — I4891 Unspecified atrial fibrillation: Secondary | ICD-10-CM | POA: Diagnosis not present

## 2013-09-04 DIAGNOSIS — I5022 Chronic systolic (congestive) heart failure: Secondary | ICD-10-CM

## 2013-09-04 DIAGNOSIS — F419 Anxiety disorder, unspecified: Secondary | ICD-10-CM

## 2013-09-04 DIAGNOSIS — G8929 Other chronic pain: Secondary | ICD-10-CM

## 2013-09-04 DIAGNOSIS — F411 Generalized anxiety disorder: Secondary | ICD-10-CM | POA: Diagnosis not present

## 2013-09-04 DIAGNOSIS — M549 Dorsalgia, unspecified: Secondary | ICD-10-CM

## 2013-09-04 MED ORDER — HYDROCODONE-ACETAMINOPHEN 10-325 MG PO TABS
1.0000 | ORAL_TABLET | Freq: Four times a day (QID) | ORAL | Status: DC | PRN
Start: 2013-09-04 — End: 2013-12-08

## 2013-09-04 NOTE — Assessment & Plan Note (Signed)
Paced Doing home monitoring for coumadin

## 2013-09-04 NOTE — Progress Notes (Signed)
Subjective:    Patient ID: Micheal [name], male    DOB: 20-Oct-1940, 72 y.o.   MRN: [id number]  HPI Here with daughter Having some trouble with constipation--last couple of months and has worsened Has tried dulcolax tabs-- seems to work. Then it recurs No abdominal pain No blood in stool  Breathing is good No DOE with his usual tasks No chest pain No palpitations No sig edema Discussed ---no fluid restriction. He is good about salt restriction.  Now sleeps in bed No PND  Anxiety has been controlled Continues to take the alprazolam tid On the citalopram also  Current Outpatient Prescriptions on File Prior to Visit  Medication Sig Dispense Refill  . ALPRAZolam (XANAX) 0.5 MG tablet Take 0.5 mg by mouth 3 (three) times daily as needed.       . carvedilol (COREG) 3.125 MG tablet Take 3.125 mg by mouth 2 (two) times daily with a meal.       . citalopram (CELEXA) 40 MG tablet Take 40 mg by mouth daily.       Marland Kitchen COUMADIN 5 MG tablet as directed.       . cyclobenzaprine (FLEXERIL) 5 MG tablet Take 1 tablet (5 mg total) by mouth 2 (two) times daily as needed for muscle spasms.  30 tablet  0  . furosemide (LASIX) 40 MG tablet Take 40 mg by mouth daily.       Marland Kitchen HYDROcodone-acetaminophen (NORCO) 10-325 MG per tablet Take 1 tablet by mouth every 6 (six) hours as needed.  60 tablet  0  . hydrOXYzine (ATARAX/VISTARIL) 50 MG tablet Take 1 tablet (50 mg total) by mouth 3 (three) times daily as needed.  270 tablet  3  . lisinopril (PRINIVIL,ZESTRIL) 5 MG tablet Take 5 mg by mouth daily.       . metolazone (ZAROXOLYN) 5 MG tablet Take 5 mg by mouth 2 (two) times daily.       . potassium chloride (K-DUR) 10 MEQ tablet Take 1 tablet (10 mEq total) by mouth daily.  90 tablet  3  . PRAVACHOL 80 MG tablet Take 80 mg by mouth daily.       . sennosides-docusate sodium (SENOKOT-S) 8.6-50 MG tablet Take 2 tablets by mouth 2 (two) times daily as needed.      Marland Kitchen ULORIC 40 MG tablet Take 80 mg by mouth  daily.       Marland Kitchen warfarin (COUMADIN) 1 MG tablet Take 1/2 tablet every other day as directed by [facility] Clinic.  30 tablet  2   No current facility-administered medications on file prior to visit.    No Known Allergies  Past Medical History  Diagnosis Date  . Automatic implantable cardiac defibrillator in situ     St. Jude  . Other left bundle branch block   . Atrial fibrillation   . 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 Chronic systolic heart failure ~2009    EF under 25%  . Hypertension   . Anxiety   . CKD (chronic kidney disease) stage 3, GFR 30-59 ml/min 2009  . Gout, unspecified     Past Surgical History  Procedure Laterality Date  . Icd implant      St. Jude Durata; 12/27/07-CHF, afib, LBB, 3 vessel disease  . Fracture of left humerus  2008    Rebroken 12/13    No family history on file.  History   Social History  . Marital Status: Divorced    Spouse Name:  N/A    Number of Children: 2  . Years of Education: N/A   Occupational History  . Retired as Tourist information centre manager business   Social History Main Topics  . Smoking status: Former Smoker    Quit date: 09/25/1993  . Smokeless tobacco: Never Used     Comment: quit 15 years ago  . Alcohol Use: No     Comment: past use but not currently  . Drug Use: Not on file  . Sexual Activity: Not on file   Other Topics Concern  . Not on file   Social History Narrative   Divorced   2 daughters   No living will   Requests daughter [name] as health care POA   Would accept resuscitation   Not sure about tube feeds    Review of Systems Appetite is pretty good Weight is up 7# since last visit    Objective:   Physical Exam  Constitutional: He appears well-developed and well-nourished. No distress.  Neck: Normal range of motion. Neck supple. No thyromegaly present.  Cardiovascular: Normal rate, regular rhythm and normal heart sounds.  Exam reveals no gallop.   Faint pulse right  foot---not really palpable on left  Pulmonary/Chest: Effort normal and breath sounds normal. No respiratory distress. He has no wheezes. He has no rales.  Abdominal: Soft. There is no tenderness.  Musculoskeletal: He exhibits edema.  Trace ankle edema  Lymphadenopathy:    He has no cervical adenopathy.  Psychiatric: He has a normal mood and affect. His behavior is normal.          Assessment & Plan:

## 2013-09-04 NOTE — Assessment & Plan Note (Signed)
Uses the hydrocodone bid Will write for #120 since he lives so far away---this will be a 2 month supply

## 2013-09-04 NOTE — Assessment & Plan Note (Signed)
Quiet on uloric 

## 2013-09-04 NOTE — Assessment & Plan Note (Signed)
Doing okay  Watches weights daily No changes

## 2013-09-04 NOTE — Assessment & Plan Note (Signed)
Severe but controlled on his chronic regimen

## 2013-09-04 NOTE — Assessment & Plan Note (Signed)
Will recheck level

## 2013-09-04 NOTE — Patient Instructions (Signed)
Please try miralax (polyethylene glycol) --- 1 capful with a full glass of water daily to prevent constipation.

## 2013-09-05 ENCOUNTER — Ambulatory Visit (INDEPENDENT_AMBULATORY_CARE_PROVIDER_SITE_OTHER): Payer: Medicare Other | Admitting: Family Medicine

## 2013-09-05 DIAGNOSIS — Z7901 Long term (current) use of anticoagulants: Secondary | ICD-10-CM

## 2013-09-05 DIAGNOSIS — I4891 Unspecified atrial fibrillation: Secondary | ICD-10-CM

## 2013-09-05 LAB — BASIC METABOLIC PANEL
BUN: 70 mg/dL — ABNORMAL HIGH (ref 6–23)
CO2: 33 mEq/L — ABNORMAL HIGH (ref 19–32)
Chloride: 94 mEq/L — ABNORMAL LOW (ref 96–112)
Creatinine, Ser: 2.7 mg/dL — ABNORMAL HIGH (ref 0.4–1.5)
GFR: 24.89 mL/min — ABNORMAL LOW (ref >60.00)
Glucose, Bld: 83 mg/dL (ref 70–99)
Potassium: 4.1 mEq/L (ref 3.5–5.1)
Sodium: 137 mEq/L (ref 135–145)

## 2013-09-05 LAB — HEPATIC FUNCTION PANEL
ALT: 16 U/L (ref 0–53)
AST: 19 U/L (ref 0–37)
Alkaline Phosphatase: 54 U/L (ref 39–117)
Bilirubin, Direct: 0.2 mg/dL (ref 0.0–0.3)
Total Bilirubin: 0.8 mg/dL (ref 0.3–1.2)
Total Protein: 7.9 g/dL (ref 6.0–8.3)

## 2013-09-05 LAB — LIPID PANEL
HDL: 43.9 mg/dL (ref >39.00)
Total CHOL/HDL Ratio: 3
Triglycerides: 62 mg/dL (ref 0.0–149.0)
VLDL: 12.4 mg/dL (ref 0.0–40.0)

## 2013-09-05 LAB — CBC WITH DIFFERENTIAL/PLATELET
Eosinophils Absolute: 0.5 10*3/uL (ref 0.0–0.7)
HCT: 41.2 % (ref 39.0–52.0)
Lymphs Abs: 1.7 10*3/uL (ref 0.7–4.0)
MCV: 87.3 fl (ref 78.0–100.0)
Platelets: 212 10*3/uL (ref 150.0–400.0)
RDW: 15.5 % — ABNORMAL HIGH (ref 11.5–14.6)

## 2013-09-09 ENCOUNTER — Encounter: Payer: Self-pay | Admitting: *Deleted

## 2013-09-11 DIAGNOSIS — I4891 Unspecified atrial fibrillation: Secondary | ICD-10-CM | POA: Diagnosis not present

## 2013-09-11 DIAGNOSIS — I5022 Chronic systolic (congestive) heart failure: Secondary | ICD-10-CM | POA: Diagnosis not present

## 2013-09-16 ENCOUNTER — Telehealth: Payer: Self-pay

## 2013-09-16 NOTE — Telephone Encounter (Signed)
Spoke with daughter and advised results. She will call on Friday with a weight update

## 2013-09-16 NOTE — Telephone Encounter (Signed)
[name] pt daughter left v/m that pt had labs today at Dr Garnett Farm office due to kidney function. From 09-12-13 to today pt has gained 8 lbs. Pt was off fluid pills on 09/14/13 -09/15/13. Restarted fluid pill today. [name] request cb if needed.

## 2013-09-16 NOTE — Telephone Encounter (Signed)
Let her know that Dr Cherylann Ratel and I agree that it is more important to keep the fluid off (and prevent hospitalization fo r CHF), then it is to keep the kidney tests better If the weight isn't coming off with the fluid pill, have her let me know

## 2013-09-30 ENCOUNTER — Other Ambulatory Visit: Payer: Self-pay

## 2013-09-30 ENCOUNTER — Other Ambulatory Visit: Payer: Self-pay | Admitting: Internal Medicine

## 2013-09-30 MED ORDER — ALPRAZOLAM 0.5 MG PO TABS: 0.5000 mg | ORAL_TABLET | Freq: Three times a day (TID) | ORAL | Status: DC | PRN

## 2013-09-30 NOTE — Telephone Encounter (Signed)
pts daughter left v/m requesting refill alprazolam 0.5 mg taking one tab three times a day; pt has been getting med at [facility] but wants refilled at [place].Please advise.

## 2013-09-30 NOTE — Telephone Encounter (Signed)
Okay #90 x 0 

## 2013-09-30 NOTE — Telephone Encounter (Signed)
rx called into pharmacy

## 2013-10-01 DIAGNOSIS — I4891 Unspecified atrial fibrillation: Secondary | ICD-10-CM | POA: Diagnosis not present

## 2013-10-01 NOTE — Telephone Encounter (Signed)
This was called into the [facility] yesterday Please check on this

## 2013-10-01 NOTE — Telephone Encounter (Signed)
Please call them back I just checked the state controlled substance registry and his last 2 prescriptions were filled on 11/3 and 12/1 ---neither at [facility] (had been getting at [facility])  He is due for this refill now

## 2013-10-01 NOTE — Telephone Encounter (Signed)
I called the pharmacy and they stated that the Rx cannot be filled until 10/22/13 according to their records

## 2013-10-01 NOTE — Telephone Encounter (Signed)
Ok to refill 

## 2013-10-02 DIAGNOSIS — N039 Chronic nephritic syndrome with unspecified morphologic changes: Secondary | ICD-10-CM | POA: Diagnosis not present

## 2013-10-02 DIAGNOSIS — R809 Proteinuria, unspecified: Secondary | ICD-10-CM | POA: Diagnosis not present

## 2013-10-02 DIAGNOSIS — N2581 Secondary hyperparathyroidism of renal origin: Secondary | ICD-10-CM | POA: Diagnosis not present

## 2013-10-02 DIAGNOSIS — N184 Chronic kidney disease, stage 4 (severe): Secondary | ICD-10-CM | POA: Diagnosis not present

## 2013-10-02 DIAGNOSIS — D631 Anemia in chronic kidney disease: Secondary | ICD-10-CM | POA: Diagnosis not present

## 2013-10-02 DIAGNOSIS — I10 Essential (primary) hypertension: Secondary | ICD-10-CM | POA: Diagnosis not present

## 2013-10-02 NOTE — Telephone Encounter (Signed)
Please follow up on this

## 2013-10-02 NOTE — Telephone Encounter (Signed)
Spoke with pharmacist Beth at [address] and she states pt got #90 at [facility] on 09/29/13 prescribed by Serafina Royals and she also prescribed the other dates 11/3 & 12/1. The insurance will not fill until 10/22/13. Please advise

## 2013-10-02 NOTE — Telephone Encounter (Signed)
I have tried calling [facility] several times today, the phone will ring then send it back thru the cycle to ring again. Will try again tomorrow

## 2013-10-03 NOTE — Telephone Encounter (Signed)
Please call him I will not be able to refill the alprazolam anymore since he has been getting it elsewhere also. He can continue to come here for me to be his doctor, but I can't refill the controlled substance anymore

## 2013-10-03 NOTE — Telephone Encounter (Signed)
Spoke with pharmacist at [facility] and she also states that pt's daughter picked up #90 of Xanax on 09/29/13 and also the previous prescriptions have been written by Serafina Royals.

## 2013-10-03 NOTE — Telephone Encounter (Signed)
Please call the [place] [facility] to see if they have prescribed this for him since I have been seeing him

## 2013-10-03 NOTE — Telephone Encounter (Signed)
Spoke with daughter and she states she didn't want the rx now, she was calling for next month?? She also stated they where only [facility] because of the discount on meds, she stated this is just a mix-up and she didn't need the refill this month. I asked her who Serafina Royals was and she stated she didn't know. Please advise

## 2013-10-03 NOTE — Telephone Encounter (Signed)
I will need to discuss this with them in the office when he is next due-I will not refill without a office visit

## 2013-10-03 NOTE — Telephone Encounter (Signed)
.  left message to have patient or daughter return my call.

## 2013-10-03 NOTE — Telephone Encounter (Signed)
Daughter advised.

## 2013-10-22 ENCOUNTER — Telehealth: Payer: Self-pay | Admitting: Internal Medicine

## 2013-10-22 DIAGNOSIS — R809 Proteinuria, unspecified: Secondary | ICD-10-CM | POA: Diagnosis not present

## 2013-10-22 DIAGNOSIS — N2581 Secondary hyperparathyroidism of renal origin: Secondary | ICD-10-CM | POA: Diagnosis not present

## 2013-10-22 DIAGNOSIS — N183 Chronic kidney disease, stage 3 unspecified: Secondary | ICD-10-CM | POA: Diagnosis not present

## 2013-10-22 DIAGNOSIS — I10 Essential (primary) hypertension: Secondary | ICD-10-CM | POA: Diagnosis not present

## 2013-10-22 NOTE — Telephone Encounter (Signed)
PC from Dr Holley Raring Weight is still going up He is going to set up home health to give furosemide 80 IV twice a week (no oral on those days)---at least till back closer to 244# dry weight (up consistently over 250# now)

## 2013-10-28 ENCOUNTER — Telehealth: Payer: Self-pay | Admitting: *Deleted

## 2013-10-28 NOTE — Telephone Encounter (Signed)
plz refer to prior refill request.  PCP recommended office visit prior to refilling xanax.  Will need to schedule office visit to discuss with PCP on his return.

## 2013-10-28 NOTE — Telephone Encounter (Signed)
Spoke with daughter and advised results. I asked her why she called on 09/30/12 asking for another refill and she stated she was trying to be pro-active in getting the refill for February. I told she should have waited closer to February before asking for a refill and that it looks bad when he got a refill at [facility] on 09/29/13 for #90 and she then called [facility] on 09/30/13 asking for another refill ( this was not filled ) Per daughter it was her mistake and the patient shouldn't have to pay for this and he can't go without his medication.  Daughter scheduled appt with Dr. Damita Dunnings tomorrow night at 6:15 for med refill because pt needs his medication.

## 2013-10-28 NOTE — Telephone Encounter (Signed)
Micheal [name] called requesting a refill on Xanax. Micheal [name] is aware that Dr. Silvio Pate is out of the office this week. See previous notes regarding this medication. Pharmacy [facility]. Please let Micheal [name] know when this has been taken care of.

## 2013-10-29 ENCOUNTER — Ambulatory Visit: Payer: Medicare Other | Admitting: Family Medicine

## 2013-10-29 NOTE — Telephone Encounter (Signed)
Noted, thanks, routed to Dr. Silvio Pate as a FYI.

## 2013-10-29 NOTE — Telephone Encounter (Signed)
Spoke with daughter and she asked Dr. Holley Raring for enough xanax to last until Dr.Letvak comes back, the got a 14day supply, she's scared pt will have a panic attack and this will set him back tremendously. Daughter wanted me to cancel the appt tonight with Dr. Damita Dunnings and schedule with Dr.Letvak, which I did.

## 2013-10-29 NOTE — Telephone Encounter (Signed)
Will see today.  

## 2013-10-30 DIAGNOSIS — I4891 Unspecified atrial fibrillation: Secondary | ICD-10-CM | POA: Diagnosis not present

## 2013-10-30 DIAGNOSIS — N183 Chronic kidney disease, stage 3 unspecified: Secondary | ICD-10-CM | POA: Diagnosis not present

## 2013-11-01 NOTE — Telephone Encounter (Signed)
They had told me that [place] [facility] would not prescribe his xanax and that is why they changed to me. Our contract is explicit with this and they violated it (by getting multiple prescriptions and misleading me by still going to the [place] [facility]) I don't think I will be able to prescribe for him anymore---- I know he has appt next week, but I probably can't change this determination Please confirm that they had signed the Controlled substance agreement

## 2013-11-03 ENCOUNTER — Encounter: Payer: Self-pay | Admitting: Internal Medicine

## 2013-11-03 ENCOUNTER — Ambulatory Visit (INDEPENDENT_AMBULATORY_CARE_PROVIDER_SITE_OTHER): Payer: Medicare Other | Admitting: Internal Medicine

## 2013-11-03 VITALS — BP 108/70 | HR 75 | Temp 97.7°F | Wt 245.0 lb

## 2013-11-03 DIAGNOSIS — F411 Generalized anxiety disorder: Secondary | ICD-10-CM

## 2013-11-03 DIAGNOSIS — F419 Anxiety disorder, unspecified: Secondary | ICD-10-CM

## 2013-11-03 MED ORDER — ALPRAZOLAM 0.5 MG PO TABS: 0.5000 mg | ORAL_TABLET | Freq: Three times a day (TID) | ORAL | Status: DC | PRN

## 2013-11-03 NOTE — Progress Notes (Signed)
Subjective:    Patient ID: Micheal [name], male    DOB: 1941/04/18, 73 y.o.   MRN: [id number]  HPI Here with daughter  Called in due to finding of prescriptions from [place] [facility] as well as from me Prompted by call from daughter for refill when just filled other Rx from them  They had some refills left over so filled them I reviewed the [place] controlled substance site---they have not filled alprazolam or hydrocodone more than once a month  Reviewed the controlled substance agreement and gave her a copy again  Current Outpatient Prescriptions on File Prior to Visit  Medication Sig Dispense Refill  . ALPRAZolam (XANAX) 0.5 MG tablet Take 1 tablet (0.5 mg total) by mouth 3 (three) times daily as needed.  90 tablet  0  . carvedilol (COREG) 3.125 MG tablet Take 3.125 mg by mouth 2 (two) times daily with a meal.       . citalopram (CELEXA) 40 MG tablet Take 40 mg by mouth daily.       Marland Kitchen COUMADIN 5 MG tablet as directed.       . cyclobenzaprine (FLEXERIL) 5 MG tablet Take 1 tablet (5 mg total) by mouth 2 (two) times daily as needed for muscle spasms.  30 tablet  0  . furosemide (LASIX) 40 MG tablet Take 160 mg by mouth daily.       Marland Kitchen HYDROcodone-acetaminophen (NORCO) 10-325 MG per tablet Take 1 tablet by mouth every 6 (six) hours as needed.  120 tablet  0  . hydrOXYzine (ATARAX/VISTARIL) 50 MG tablet Take 1 tablet (50 mg total) by mouth 3 (three) times daily as needed.  270 tablet  3  . lisinopril (PRINIVIL,ZESTRIL) 5 MG tablet Take 5 mg by mouth daily.       . metolazone (ZAROXOLYN) 5 MG tablet Take 5 mg by mouth 2 (two) times daily.       . polyethylene glycol (MIRALAX / GLYCOLAX) packet Take 17 g by mouth daily.      . potassium chloride (K-DUR) 10 MEQ tablet Take 1 tablet (10 mEq total) by mouth daily.  90 tablet  3  . PRAVACHOL 80 MG tablet Take 80 mg by mouth daily.       Marland Kitchen ULORIC 40 MG tablet Take 80 mg by mouth daily.       Marland Kitchen warfarin (COUMADIN) 1 MG tablet Take 1/2 tablet every other  day as directed by Coumadin Clinic.  30 tablet  2   No current facility-administered medications on file prior to visit.    No Known Allergies  Past Medical History  Diagnosis Date  . Automatic implantable cardiac defibrillator in situ     St. Jude  . Other left bundle branch block   . Atrial fibrillation   . Chronic systolic heart failure ~7829    EF under 25%  . Hypertension   . Anxiety   . CKD (chronic kidney disease) stage 3, GFR 30-59 ml/min 2009  . Gout, unspecified   . Chronic back pain     Past Surgical History  Procedure Laterality Date  . Icd implant      St. Jude Durata; 12/27/07-CHF, afib, LBB, 3 vessel disease  . Fracture of left humerus  2008    Rebroken 12/13    No family history on file.  History   Social History  . Marital Status: Divorced    Spouse Name: N/A    Number of Children: 2  . Years of Education: N/A  Occupational History  . 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 Smoking status: Former Smoker    Quit date: 09/25/1993  . Smokeless tobacco: Never Used     Comment: quit 15 years ago  . Alcohol Use: No     Comment: past use but not currently  . Drug Use: Not on file  . Sexual Activity: Not on file   Other Topics Concern  . Not on file   Social History Narrative   Divorced   2 daughters   No living will   Requests daughter Micheal [name] as health care POA   Would accept resuscitation   Not sure about tube feeds   Review of Systems     Objective:   Physical Exam  Psychiatric: He has a normal mood and affect. His behavior is normal.          Assessment & Plan:

## 2013-11-03 NOTE — Assessment & Plan Note (Signed)
Discussed the controlled substance responsibilities They will only get Rx from me (they had used some refills) Will monitor on site regularly

## 2013-11-06 ENCOUNTER — Encounter: Payer: Self-pay | Admitting: *Deleted

## 2013-11-19 DIAGNOSIS — N2581 Secondary hyperparathyroidism of renal origin: Secondary | ICD-10-CM | POA: Diagnosis not present

## 2013-11-19 DIAGNOSIS — R809 Proteinuria, unspecified: Secondary | ICD-10-CM | POA: Diagnosis not present

## 2013-11-19 DIAGNOSIS — I5022 Chronic systolic (congestive) heart failure: Secondary | ICD-10-CM | POA: Diagnosis not present

## 2013-11-19 DIAGNOSIS — N184 Chronic kidney disease, stage 4 (severe): Secondary | ICD-10-CM | POA: Diagnosis not present

## 2013-12-02 ENCOUNTER — Telehealth: Payer: Self-pay | Admitting: Family Medicine

## 2013-12-02 DIAGNOSIS — I4891 Unspecified atrial fibrillation: Secondary | ICD-10-CM | POA: Diagnosis not present

## 2013-12-02 DIAGNOSIS — I519 Heart disease, unspecified: Secondary | ICD-10-CM | POA: Diagnosis not present

## 2013-12-02 DIAGNOSIS — N189 Chronic kidney disease, unspecified: Secondary | ICD-10-CM | POA: Diagnosis not present

## 2013-12-02 DIAGNOSIS — I2510 Atherosclerotic heart disease of native coronary artery without angina pectoris: Secondary | ICD-10-CM | POA: Diagnosis not present

## 2013-12-02 DIAGNOSIS — R609 Edema, unspecified: Secondary | ICD-10-CM | POA: Diagnosis not present

## 2013-12-02 NOTE — Telephone Encounter (Signed)
I spoke in person with Micheal [name] who works with Micheal [name] (they are a branch of [facility]) regarding the faxed request for pt to check INR at home.  She said that they got a notification that pt was no longer being monitored by the [facility] (pt was previously checking at home and sending results there).  I informed her that pt has been seeing me in the [facility] for over a year and he is not monitoring at home.  I also told her that Dr. Silvio Pate never agreed to this.  She said that she would inactivate pt in their system.

## 2013-12-03 NOTE — Telephone Encounter (Signed)
Thanks He is not truly homebound and the weekly checks get to be too much

## 2013-12-04 NOTE — Telephone Encounter (Signed)
Pt's daughter called to say that pt has been testing at home but his results were being sent to the [facility].  Since he is a pt of Dr. Silvio Pate now, she requested that his results be sent here.  She said that she has a hard time getting off of work and wants to know if Dr. Silvio Pate will approve the paperwork to let pt continue to check at home.  Form placed on Dr. Alla German desk.  Informed pt's daughter that Dr. Silvio Pate is out of office and it will be Monday before I call her back.  She verbalized understanding.

## 2013-12-08 ENCOUNTER — Other Ambulatory Visit: Payer: Self-pay

## 2013-12-08 NOTE — Telephone Encounter (Signed)
Form faxed.    Pt's daughter notified.

## 2013-12-08 NOTE — Telephone Encounter (Signed)
[name] pts son left v/m requesting refill alprazolam and vicodin; call [name] when ready for pick up. [name] wanted Dr Silvio Pate to be aware pt having panic attacks twice a week and [name] wants to know if any further suggestions. Dr Holley Raring increased pts diurectic, Lasix 160 mg daily; [name] wants to know if Dr Silvio Pate will prescribe the Lasix now and [name] said has been checking Coumadin levels at home and pt has been running in good range; [name] wants to know if can continue to do Coumadin levels at home.Please advise. [name] request cb.

## 2013-12-08 NOTE — Telephone Encounter (Signed)
I have signed the form  Please follow up with them about notification and schedule

## 2013-12-09 DIAGNOSIS — I4891 Unspecified atrial fibrillation: Secondary | ICD-10-CM | POA: Diagnosis not present

## 2013-12-09 MED ORDER — FUROSEMIDE 40 MG PO TABS: 160.0000 mg | ORAL_TABLET | Freq: Every day | ORAL | Status: DC

## 2013-12-09 MED ORDER — ALPRAZOLAM 0.5 MG PO TABS: 0.5000 mg | ORAL_TABLET | Freq: Three times a day (TID) | ORAL | Status: DC | PRN

## 2013-12-09 MED ORDER — HYDROCODONE-ACETAMINOPHEN 10-325 MG PO TABS: 1.0000 | ORAL_TABLET | Freq: Four times a day (QID) | ORAL | Status: DC | PRN

## 2013-12-09 NOTE — Telephone Encounter (Signed)
Spoke with patient and advised rx ready for pick-up and it will be at the front desk. Spoke with daughter and advised results.

## 2013-12-09 NOTE — Telephone Encounter (Signed)
I can prescribe the furosemide but he still needs to keep up with Dr Holley Raring. I have signed the order to continue the home coumadin monitoring and Benjie Karvonen is working on getting it all set up again

## 2013-12-10 ENCOUNTER — Ambulatory Visit (INDEPENDENT_AMBULATORY_CARE_PROVIDER_SITE_OTHER): Payer: Medicare Other | Admitting: Family Medicine

## 2013-12-10 DIAGNOSIS — I4891 Unspecified atrial fibrillation: Secondary | ICD-10-CM

## 2013-12-10 DIAGNOSIS — Z7901 Long term (current) use of anticoagulants: Secondary | ICD-10-CM

## 2013-12-10 LAB — POCT INR: INR: 2.2

## 2013-12-17 ENCOUNTER — Ambulatory Visit (INDEPENDENT_AMBULATORY_CARE_PROVIDER_SITE_OTHER): Payer: Medicare Other | Admitting: Family Medicine

## 2013-12-17 DIAGNOSIS — Z7901 Long term (current) use of anticoagulants: Secondary | ICD-10-CM

## 2013-12-17 DIAGNOSIS — I4891 Unspecified atrial fibrillation: Secondary | ICD-10-CM

## 2013-12-17 DIAGNOSIS — Z5181 Encounter for therapeutic drug level monitoring: Secondary | ICD-10-CM

## 2013-12-17 LAB — POCT INR: INR: 2.1

## 2013-12-24 ENCOUNTER — Ambulatory Visit (INDEPENDENT_AMBULATORY_CARE_PROVIDER_SITE_OTHER): Payer: Medicare Other | Admitting: Family Medicine

## 2013-12-24 DIAGNOSIS — I4891 Unspecified atrial fibrillation: Secondary | ICD-10-CM

## 2013-12-24 DIAGNOSIS — Z5181 Encounter for therapeutic drug level monitoring: Secondary | ICD-10-CM

## 2013-12-24 LAB — POCT INR: INR: 2.2

## 2013-12-31 ENCOUNTER — Ambulatory Visit (INDEPENDENT_AMBULATORY_CARE_PROVIDER_SITE_OTHER): Payer: Medicare Other | Admitting: Family Medicine

## 2013-12-31 DIAGNOSIS — I4891 Unspecified atrial fibrillation: Secondary | ICD-10-CM

## 2013-12-31 DIAGNOSIS — Z5181 Encounter for therapeutic drug level monitoring: Secondary | ICD-10-CM

## 2013-12-31 LAB — POCT INR: INR: 2.3

## 2014-01-06 DIAGNOSIS — I4891 Unspecified atrial fibrillation: Secondary | ICD-10-CM | POA: Diagnosis not present

## 2014-01-06 LAB — PROTIME-INR

## 2014-01-07 ENCOUNTER — Other Ambulatory Visit: Payer: Self-pay

## 2014-01-07 NOTE — Telephone Encounter (Signed)
Micheal [name] pts daughter left v/m; pt will need refill alprazolam this weekend and [facility] request med called to [facility] [address].Please advise.

## 2014-01-08 MED ORDER — ALPRAZOLAM 0.5 MG PO TABS: 0.5000 mg | ORAL_TABLET | Freq: Three times a day (TID) | ORAL | Status: DC | PRN

## 2014-01-08 NOTE — Telephone Encounter (Signed)
Ok to phone in xanax 

## 2014-01-08 NOTE — Telephone Encounter (Signed)
Rx called in to pharmacy. 

## 2014-01-15 ENCOUNTER — Ambulatory Visit (INDEPENDENT_AMBULATORY_CARE_PROVIDER_SITE_OTHER): Payer: Medicare Other | Admitting: General Practice

## 2014-01-15 ENCOUNTER — Telehealth: Payer: Self-pay | Admitting: Internal Medicine

## 2014-01-15 DIAGNOSIS — Z5181 Encounter for therapeutic drug level monitoring: Secondary | ICD-10-CM

## 2014-01-15 DIAGNOSIS — I4891 Unspecified atrial fibrillation: Secondary | ICD-10-CM

## 2014-01-15 LAB — POCT INR: INR: 2.6

## 2014-01-15 NOTE — Telephone Encounter (Signed)
INR result on 01/14/14 was faxed to [place] by [facility].  INR result 2.6.

## 2014-01-15 NOTE — Progress Notes (Signed)
Pre visit review using our clinic review tool, if applicable. No additional management support is needed unless otherwise documented below in the visit note. 

## 2014-01-22 ENCOUNTER — Ambulatory Visit (INDEPENDENT_AMBULATORY_CARE_PROVIDER_SITE_OTHER): Payer: Medicare Other | Admitting: Family Medicine

## 2014-01-22 DIAGNOSIS — Z5181 Encounter for therapeutic drug level monitoring: Secondary | ICD-10-CM

## 2014-01-22 DIAGNOSIS — I4891 Unspecified atrial fibrillation: Secondary | ICD-10-CM

## 2014-01-22 LAB — POCT INR: INR: 2.6

## 2014-01-26 ENCOUNTER — Other Ambulatory Visit: Payer: Self-pay | Admitting: *Deleted

## 2014-01-26 MED ORDER — POTASSIUM CHLORIDE ER 10 MEQ PO TBCR: 10.0000 meq | EXTENDED_RELEASE_TABLET | Freq: Every day | ORAL | Status: DC

## 2014-01-26 MED ORDER — LISINOPRIL 5 MG PO TABS: 5.0000 mg | ORAL_TABLET | Freq: Every day | ORAL | Status: DC

## 2014-01-26 MED ORDER — WARFARIN SODIUM 5 MG PO TABS: 5.0000 mg | ORAL_TABLET | ORAL | Status: DC

## 2014-01-26 MED ORDER — PRAVASTATIN SODIUM 80 MG PO TABS: 80.0000 mg | ORAL_TABLET | Freq: Every day | ORAL | Status: DC

## 2014-01-26 MED ORDER — FEBUXOSTAT 40 MG PO TABS: 80.0000 mg | ORAL_TABLET | Freq: Every day | ORAL | Status: AC

## 2014-01-26 MED ORDER — CITALOPRAM HYDROBROMIDE 40 MG PO TABS: 40.0000 mg | ORAL_TABLET | Freq: Every day | ORAL | Status: DC

## 2014-01-26 MED ORDER — FUROSEMIDE 40 MG PO TABS: 160.0000 mg | ORAL_TABLET | Freq: Every day | ORAL | Status: DC

## 2014-01-26 MED ORDER — CARVEDILOL 3.125 MG PO TABS: 3.1250 mg | ORAL_TABLET | Freq: Two times a day (BID) | ORAL | Status: DC

## 2014-01-26 NOTE — Telephone Encounter (Signed)
rx sent to pharmacy by e-script  

## 2014-01-26 NOTE — Telephone Encounter (Signed)
Okay to refill all for a year 

## 2014-01-26 NOTE — Telephone Encounter (Signed)
Ok to fill? Per daughter since they don't go to [facility] anymore they can no longer fill meds there.

## 2014-01-29 ENCOUNTER — Ambulatory Visit (INDEPENDENT_AMBULATORY_CARE_PROVIDER_SITE_OTHER): Payer: Medicare Other | Admitting: Family Medicine

## 2014-01-29 DIAGNOSIS — I4891 Unspecified atrial fibrillation: Secondary | ICD-10-CM

## 2014-01-29 DIAGNOSIS — Z5181 Encounter for therapeutic drug level monitoring: Secondary | ICD-10-CM

## 2014-01-29 LAB — POCT INR: INR: 2.5

## 2014-02-03 DIAGNOSIS — I4891 Unspecified atrial fibrillation: Secondary | ICD-10-CM | POA: Diagnosis not present

## 2014-02-05 ENCOUNTER — Ambulatory Visit (INDEPENDENT_AMBULATORY_CARE_PROVIDER_SITE_OTHER): Payer: Medicare Other | Admitting: Family Medicine

## 2014-02-05 DIAGNOSIS — Z5181 Encounter for therapeutic drug level monitoring: Secondary | ICD-10-CM

## 2014-02-05 DIAGNOSIS — I4891 Unspecified atrial fibrillation: Secondary | ICD-10-CM

## 2014-02-05 LAB — POCT INR: INR: 2.2

## 2014-02-09 ENCOUNTER — Other Ambulatory Visit: Payer: Self-pay | Admitting: *Deleted

## 2014-02-09 ENCOUNTER — Other Ambulatory Visit: Payer: Self-pay | Admitting: Internal Medicine

## 2014-02-09 MED ORDER — ALPRAZOLAM 0.5 MG PO TABS: 0.5000 mg | ORAL_TABLET | Freq: Three times a day (TID) | ORAL | Status: DC | PRN

## 2014-02-09 NOTE — Telephone Encounter (Signed)
01/08/14 

## 2014-02-09 NOTE — Telephone Encounter (Signed)
Okay #90 x 0 

## 2014-02-09 NOTE — Telephone Encounter (Signed)
rx called into pharmacy

## 2014-02-11 ENCOUNTER — Ambulatory Visit (INDEPENDENT_AMBULATORY_CARE_PROVIDER_SITE_OTHER): Payer: Medicare Other | Admitting: Family Medicine

## 2014-02-11 DIAGNOSIS — Z5181 Encounter for therapeutic drug level monitoring: Secondary | ICD-10-CM

## 2014-02-11 DIAGNOSIS — I4891 Unspecified atrial fibrillation: Secondary | ICD-10-CM

## 2014-02-11 LAB — POCT INR: INR: 2.5

## 2014-02-12 DIAGNOSIS — M109 Gout, unspecified: Secondary | ICD-10-CM | POA: Diagnosis not present

## 2014-02-12 DIAGNOSIS — I5021 Acute systolic (congestive) heart failure: Secondary | ICD-10-CM | POA: Diagnosis not present

## 2014-02-12 DIAGNOSIS — N183 Chronic kidney disease, stage 3 unspecified: Secondary | ICD-10-CM | POA: Diagnosis not present

## 2014-02-12 DIAGNOSIS — I5022 Chronic systolic (congestive) heart failure: Secondary | ICD-10-CM | POA: Diagnosis not present

## 2014-02-12 DIAGNOSIS — N2581 Secondary hyperparathyroidism of renal origin: Secondary | ICD-10-CM | POA: Diagnosis not present

## 2014-02-12 DIAGNOSIS — R809 Proteinuria, unspecified: Secondary | ICD-10-CM | POA: Diagnosis not present

## 2014-02-17 ENCOUNTER — Ambulatory Visit (INDEPENDENT_AMBULATORY_CARE_PROVIDER_SITE_OTHER): Payer: Medicare Other | Admitting: Family Medicine

## 2014-02-17 DIAGNOSIS — Z5181 Encounter for therapeutic drug level monitoring: Secondary | ICD-10-CM

## 2014-02-17 DIAGNOSIS — I4891 Unspecified atrial fibrillation: Secondary | ICD-10-CM

## 2014-02-17 LAB — POCT INR: INR: 2.4

## 2014-02-19 ENCOUNTER — Other Ambulatory Visit: Payer: Self-pay | Admitting: Internal Medicine

## 2014-02-19 NOTE — Telephone Encounter (Signed)
Refill requested too soon, per verbal from Dr. Silvio Pate Last filled 02/09/2014, last monday

## 2014-02-20 DIAGNOSIS — N2581 Secondary hyperparathyroidism of renal origin: Secondary | ICD-10-CM | POA: Diagnosis not present

## 2014-02-20 DIAGNOSIS — I10 Essential (primary) hypertension: Secondary | ICD-10-CM | POA: Diagnosis not present

## 2014-02-20 DIAGNOSIS — N183 Chronic kidney disease, stage 3 unspecified: Secondary | ICD-10-CM | POA: Diagnosis not present

## 2014-02-20 DIAGNOSIS — I5022 Chronic systolic (congestive) heart failure: Secondary | ICD-10-CM | POA: Diagnosis not present

## 2014-02-24 DIAGNOSIS — N2581 Secondary hyperparathyroidism of renal origin: Secondary | ICD-10-CM | POA: Diagnosis not present

## 2014-02-24 DIAGNOSIS — I5022 Chronic systolic (congestive) heart failure: Secondary | ICD-10-CM | POA: Diagnosis not present

## 2014-02-24 DIAGNOSIS — I10 Essential (primary) hypertension: Secondary | ICD-10-CM | POA: Diagnosis not present

## 2014-02-24 DIAGNOSIS — N183 Chronic kidney disease, stage 3 unspecified: Secondary | ICD-10-CM | POA: Diagnosis not present

## 2014-02-25 ENCOUNTER — Ambulatory Visit (INDEPENDENT_AMBULATORY_CARE_PROVIDER_SITE_OTHER): Payer: Medicare Other | Admitting: Family Medicine

## 2014-02-25 DIAGNOSIS — I4891 Unspecified atrial fibrillation: Secondary | ICD-10-CM

## 2014-02-25 DIAGNOSIS — Z5181 Encounter for therapeutic drug level monitoring: Secondary | ICD-10-CM

## 2014-02-25 LAB — POCT INR: INR: 3.4

## 2014-03-02 ENCOUNTER — Encounter (INDEPENDENT_AMBULATORY_CARE_PROVIDER_SITE_OTHER): Payer: Self-pay

## 2014-03-02 ENCOUNTER — Ambulatory Visit (INDEPENDENT_AMBULATORY_CARE_PROVIDER_SITE_OTHER): Payer: Medicare Other | Admitting: Internal Medicine

## 2014-03-02 ENCOUNTER — Encounter: Payer: Self-pay | Admitting: Internal Medicine

## 2014-03-02 VITALS — BP 110/60 | HR 77 | Temp 98.6°F | Wt 245.0 lb

## 2014-03-02 DIAGNOSIS — G8929 Other chronic pain: Secondary | ICD-10-CM

## 2014-03-02 DIAGNOSIS — Z79899 Other long term (current) drug therapy: Secondary | ICD-10-CM | POA: Diagnosis not present

## 2014-03-02 DIAGNOSIS — I5022 Chronic systolic (congestive) heart failure: Secondary | ICD-10-CM | POA: Diagnosis not present

## 2014-03-02 DIAGNOSIS — F411 Generalized anxiety disorder: Secondary | ICD-10-CM

## 2014-03-02 DIAGNOSIS — F419 Anxiety disorder, unspecified: Secondary | ICD-10-CM

## 2014-03-02 DIAGNOSIS — M549 Dorsalgia, unspecified: Secondary | ICD-10-CM | POA: Diagnosis not present

## 2014-03-02 DIAGNOSIS — I4891 Unspecified atrial fibrillation: Secondary | ICD-10-CM

## 2014-03-02 DIAGNOSIS — N184 Chronic kidney disease, stage 4 (severe): Secondary | ICD-10-CM

## 2014-03-02 MED ORDER — HYDROCODONE-ACETAMINOPHEN 10-325 MG PO TABS: 1.0000 | ORAL_TABLET | Freq: Four times a day (QID) | ORAL | Status: DC | PRN

## 2014-03-02 NOTE — Assessment & Plan Note (Signed)
Uses the norco regularly

## 2014-03-02 NOTE — Progress Notes (Signed)
Subjective:    Patient ID: Micheal [name], male    DOB: 1941-08-18, 73 y.o.   MRN: [id number]  HPI Here with daughter  Has had fluid problems Now getting IM furosemide twice a week (ex-wife RN gives them) Trying to manage this given his renal insuff---Dr Lateef manages No recent SOB Able to walk around his home but doesn't do much of the instrumental ADLs  No chest pain No palpitations No dizziness or syncope No recent falls  Anxiety persists Reasonable control with his meds Regularly takes the alprazolam Some depressed mood at times--worries about his kidney function for example  Back pain has been well controlled Still gets occasional spells Uses the hydrocodone bid and occasionally for breakthrough  Current Outpatient Prescriptions on File Prior to Visit  Medication Sig Dispense Refill  . ALPRAZolam (XANAX) 0.5 MG tablet Take 1 tablet (0.5 mg total) by mouth 3 (three) times daily as needed.  90 tablet  0  . carvedilol (COREG) 3.125 MG tablet Take 1 tablet (3.125 mg total) by mouth 2 (two) times daily with a meal.  120 tablet  3  . citalopram (CELEXA) 40 MG tablet Take 1 tablet (40 mg total) by mouth daily.  90 tablet  3  . cyclobenzaprine (FLEXERIL) 5 MG tablet Take 1 tablet (5 mg total) by mouth 2 (two) times daily as needed for muscle spasms.  30 tablet  0  . febuxostat (ULORIC) 40 MG tablet Take 2 tablets (80 mg total) by mouth daily.  180 tablet  3  . HYDROcodone-acetaminophen (NORCO) 10-325 MG per tablet Take 1 tablet by mouth every 6 (six) hours as needed.  120 tablet  0  . hydrOXYzine (ATARAX/VISTARIL) 50 MG tablet Take 1 tablet (50 mg total) by mouth 3 (three) times daily as needed.  270 tablet  3  . lisinopril (PRINIVIL,ZESTRIL) 5 MG tablet Take 1 tablet (5 mg total) by mouth daily.  90 tablet  3  . metolazone (ZAROXOLYN) 5 MG tablet Take 5 mg by mouth 2 (two) times daily.       . polyethylene glycol (MIRALAX / GLYCOLAX) packet Take 17 g by mouth daily.      .  potassium chloride (K-DUR) 10 MEQ tablet Take 1 tablet (10 mEq total) by mouth daily.  90 tablet  3  . pravastatin (PRAVACHOL) 80 MG tablet Take 1 tablet (80 mg total) by mouth daily.  90 tablet  3  . warfarin (COUMADIN) 1 MG tablet Take 1/2 tablet every other day as directed by [facility].  30 tablet  2  . warfarin (COUMADIN) 5 MG tablet Take 1 tablet (5 mg total) by mouth as directed.  90 tablet  3   No current facility-administered medications on file prior to visit.    No Known Allergies  Past Medical History  Diagnosis Date  . Automatic implantable cardiac defibrillator in situ     St. Jude  . Other left bundle branch block   . Atrial fibrillation   . Chronic systolic heart failure ~6433    EF under 25%  . Hypertension   . Anxiety   . CKD (chronic kidney disease) stage 3, GFR 30-59 ml/min 2009  . Gout, unspecified   . Chronic back pain     Past Surgical History  Procedure Laterality Date  . Icd implant      St. Jude Durata; 12/27/07-CHF, afib, LBB, 3 vessel disease  . Fracture of left humerus  2008    Rebroken 12/13  No family history on file.  History   Social History  . Marital Status: Divorced    Spouse Name: N/A    Number of Children: 2  . Years of Education: N/A   Occupational History  . 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 Smoking status: Former Smoker    Quit date: 09/25/1993  . Smokeless tobacco: Never Used     Comment: quit 15 years ago  . Alcohol Use: No     Comment: past use but not currently  . Drug Use: Not on file  . Sexual Activity: Not on file   Other Topics Concern  . Not on file   Social History Narrative   Divorced   2 daughters   No living will   Requests daughter [name] as health care POA   Would accept resuscitation   Not sure about tube feeds   Review of Systems Appetite is okay Avoids all salt Sleep is variable. Falls asleep in recliner frequently    Objective:   Physical  Exam  Constitutional: He appears well-developed and well-nourished. No distress.  Neck: Normal range of motion. Neck supple. No thyromegaly present.  Cardiovascular: Normal rate, regular rhythm and normal heart sounds.  Exam reveals no gallop.   No murmur heard. Pulmonary/Chest: Effort normal and breath sounds normal. No respiratory distress. He has no wheezes. He has no rales.  Musculoskeletal: He exhibits no edema and no tenderness.  Psychiatric: He has a normal mood and affect. His behavior is normal.          Assessment & Plan:

## 2014-03-02 NOTE — Progress Notes (Signed)
Pre visit review using our clinic review tool, if applicable. No additional management support is needed unless otherwise documented below in the visit note. 

## 2014-03-02 NOTE — Assessment & Plan Note (Signed)
Severe but fair control on his meds

## 2014-03-02 NOTE — Assessment & Plan Note (Signed)
Paced On coumadin 

## 2014-03-02 NOTE — Assessment & Plan Note (Signed)
Dr Holley Raring managing No apparent uremic symptoms

## 2014-03-02 NOTE — Assessment & Plan Note (Signed)
Compensated now Dr Holley Raring managing the diuretics

## 2014-03-05 DIAGNOSIS — I4891 Unspecified atrial fibrillation: Secondary | ICD-10-CM | POA: Diagnosis not present

## 2014-03-06 ENCOUNTER — Ambulatory Visit (INDEPENDENT_AMBULATORY_CARE_PROVIDER_SITE_OTHER): Payer: Medicare Other | Admitting: Family Medicine

## 2014-03-06 DIAGNOSIS — Z5181 Encounter for therapeutic drug level monitoring: Secondary | ICD-10-CM

## 2014-03-06 DIAGNOSIS — I4891 Unspecified atrial fibrillation: Secondary | ICD-10-CM

## 2014-03-06 LAB — POCT INR: INR: 2.2

## 2014-03-10 DIAGNOSIS — I10 Essential (primary) hypertension: Secondary | ICD-10-CM | POA: Diagnosis not present

## 2014-03-10 DIAGNOSIS — I519 Heart disease, unspecified: Secondary | ICD-10-CM | POA: Diagnosis not present

## 2014-03-10 DIAGNOSIS — I5022 Chronic systolic (congestive) heart failure: Secondary | ICD-10-CM | POA: Diagnosis not present

## 2014-03-10 DIAGNOSIS — I4891 Unspecified atrial fibrillation: Secondary | ICD-10-CM | POA: Diagnosis not present

## 2014-03-11 ENCOUNTER — Ambulatory Visit (INDEPENDENT_AMBULATORY_CARE_PROVIDER_SITE_OTHER): Payer: Medicare Other | Admitting: Family Medicine

## 2014-03-11 ENCOUNTER — Other Ambulatory Visit: Payer: Self-pay

## 2014-03-11 ENCOUNTER — Other Ambulatory Visit: Payer: Self-pay | Admitting: Internal Medicine

## 2014-03-11 DIAGNOSIS — Z5181 Encounter for therapeutic drug level monitoring: Secondary | ICD-10-CM

## 2014-03-11 DIAGNOSIS — I4891 Unspecified atrial fibrillation: Secondary | ICD-10-CM

## 2014-03-11 LAB — POCT INR: INR: 2.7

## 2014-03-11 MED ORDER — ALPRAZOLAM 0.5 MG PO TABS: 0.5000 mg | ORAL_TABLET | Freq: Three times a day (TID) | ORAL | Status: DC | PRN

## 2014-03-11 NOTE — Telephone Encounter (Signed)
Per verbal from Dr. Silvio Pate, ok to fill rx called into pharmacy

## 2014-03-11 NOTE — Telephone Encounter (Signed)
[name] pts daughter request refill alprazolam to [facility] [address].

## 2014-03-19 ENCOUNTER — Ambulatory Visit (INDEPENDENT_AMBULATORY_CARE_PROVIDER_SITE_OTHER): Payer: Medicare Other | Admitting: Family Medicine

## 2014-03-19 DIAGNOSIS — Z5181 Encounter for therapeutic drug level monitoring: Secondary | ICD-10-CM

## 2014-03-19 LAB — POCT INR: INR: 2.7

## 2014-03-24 IMAGING — US US EXTREM LOW VENOUS BILAT
1 series · 13 of 24 positions shown · non-contrast
Comparison: none

REASON FOR EXAM: b/l leg edema and redness
COMMENTS:

PROCEDURE:     US  - US DOPPLER LOW EXTR BILATERAL  - April 16, 2013 [DATE]
RESULT:     Comparison: None

[Series 1: us extrem low venous bilat · 0.11mm/px · 13 of 53 slices shown]
[im 1/53]
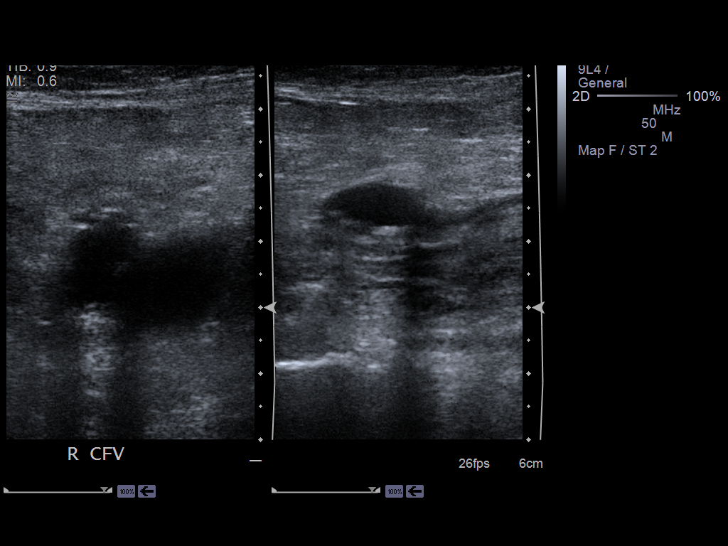
[im 5/53]
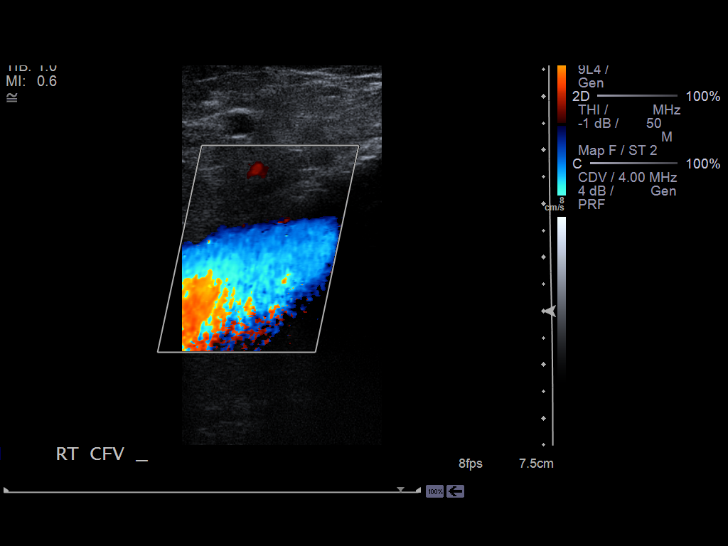
[im 10/53]
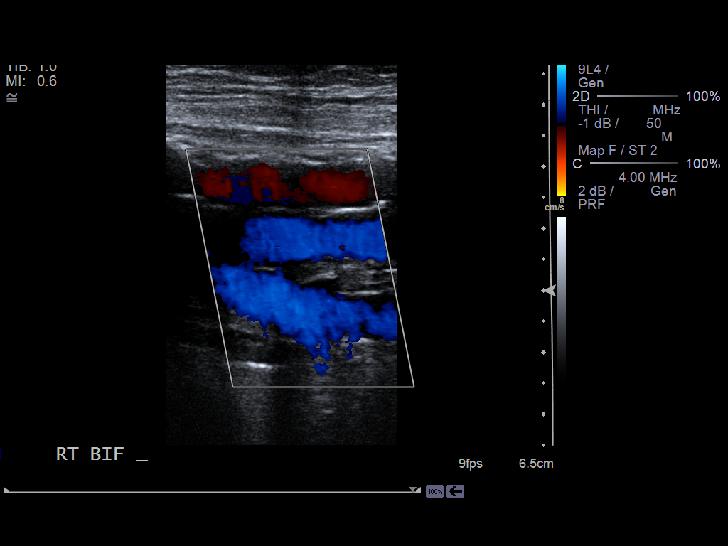
[im 14/53]
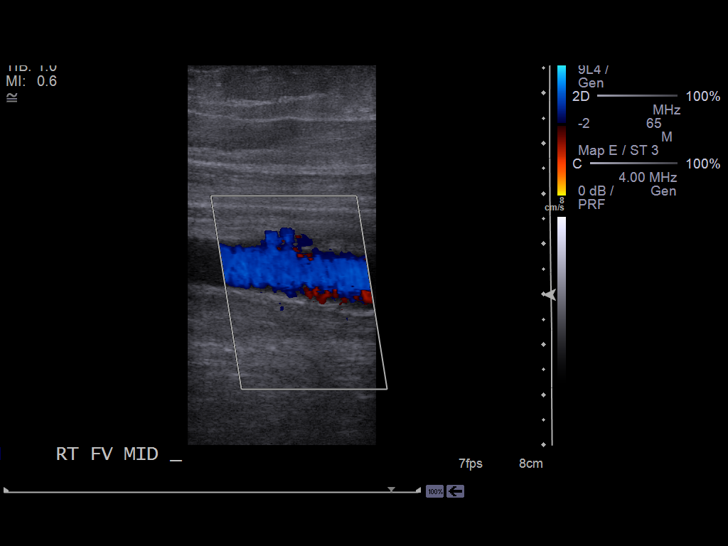
[im 19/53]
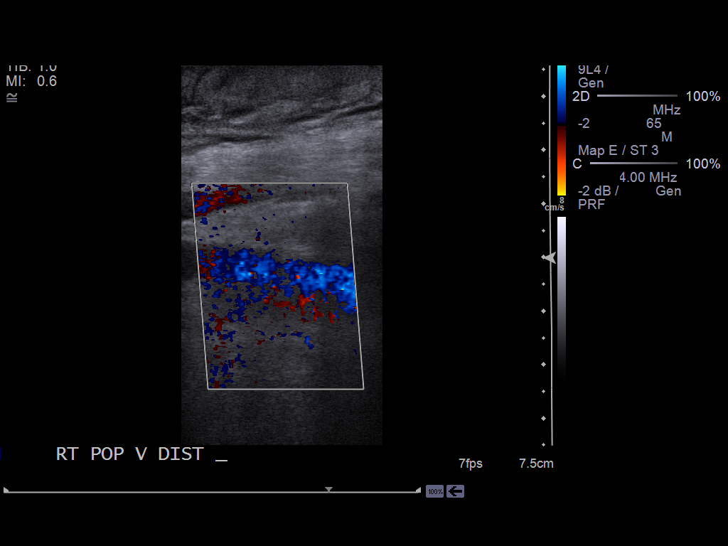
[im 23/53]
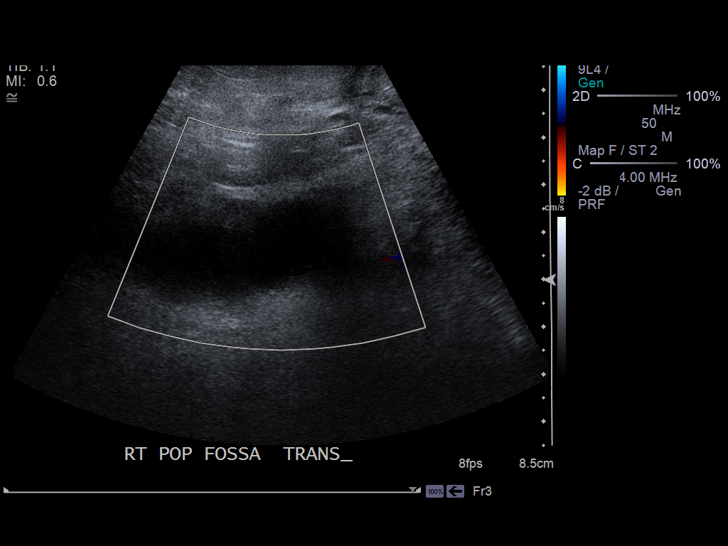
[im 28/53]
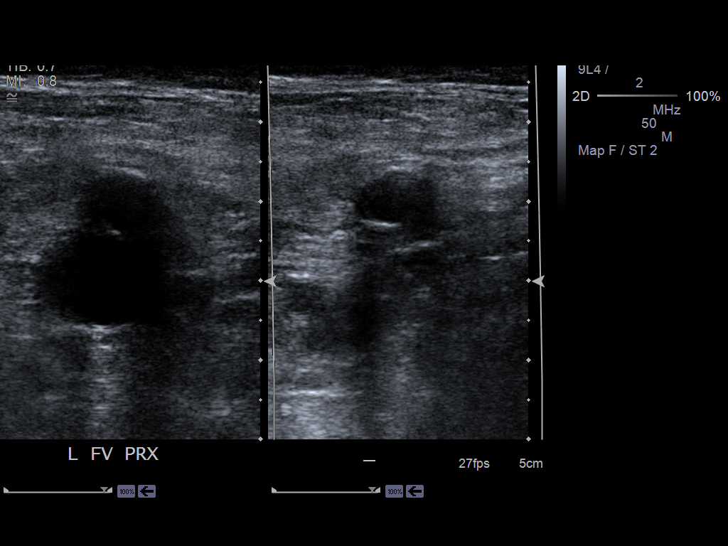
[im 30/53]
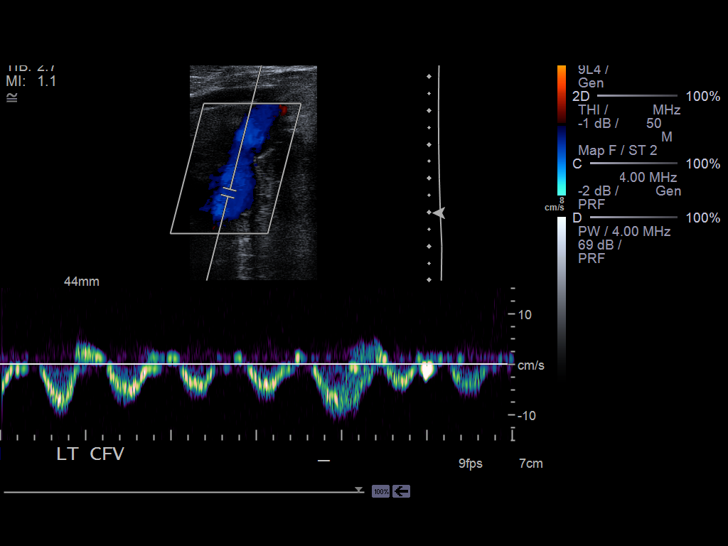
[im 34/53]
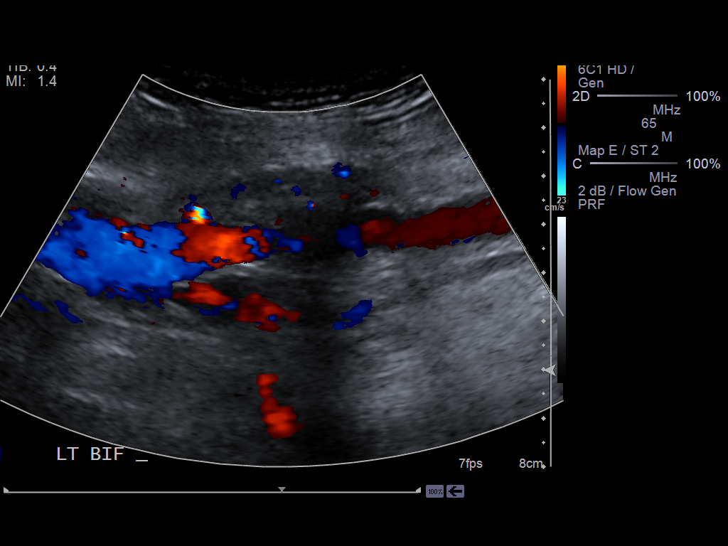
[im 39/53]
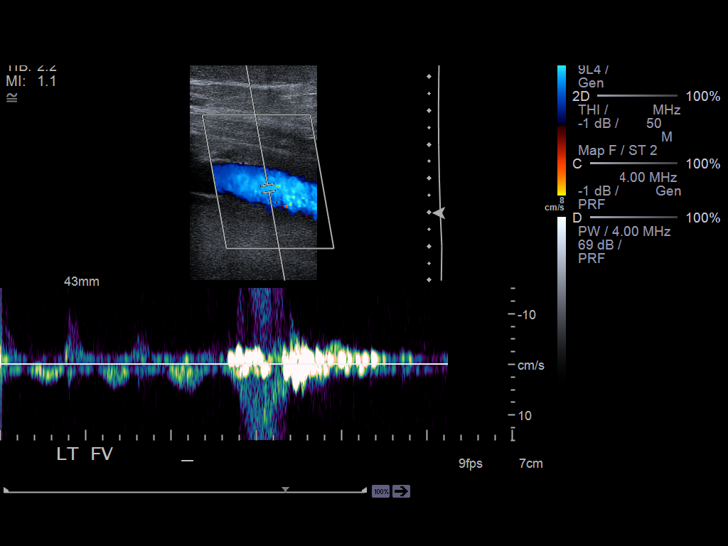
[im 43/53]
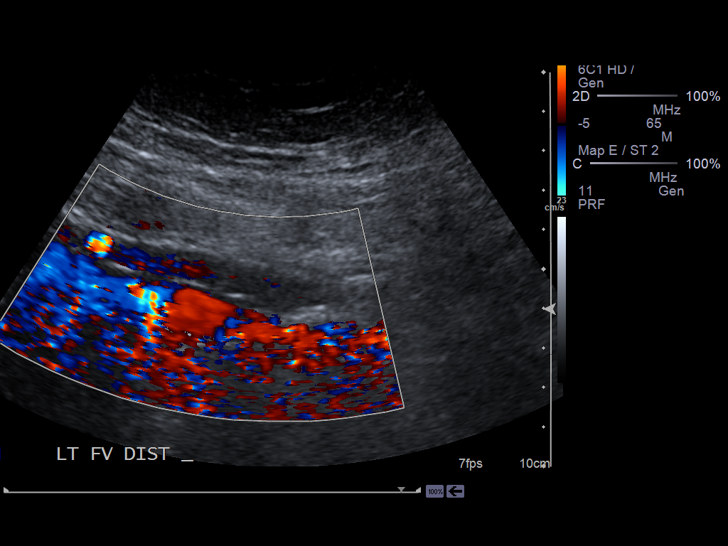
[im 48/53]
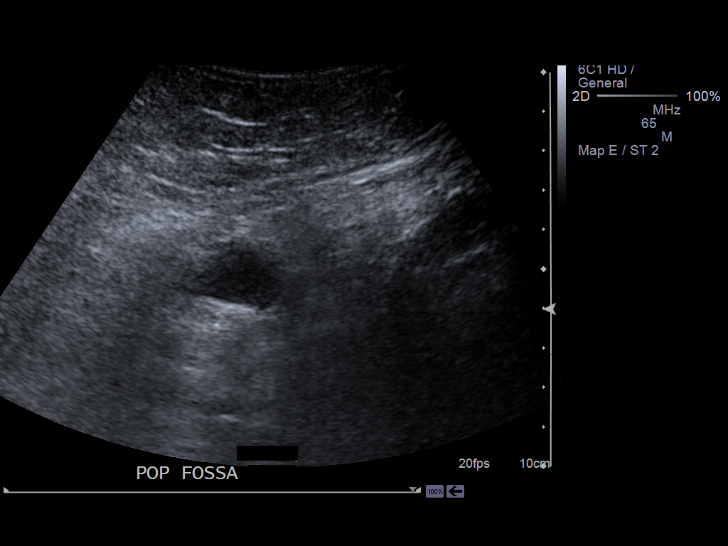
[im 53/53]
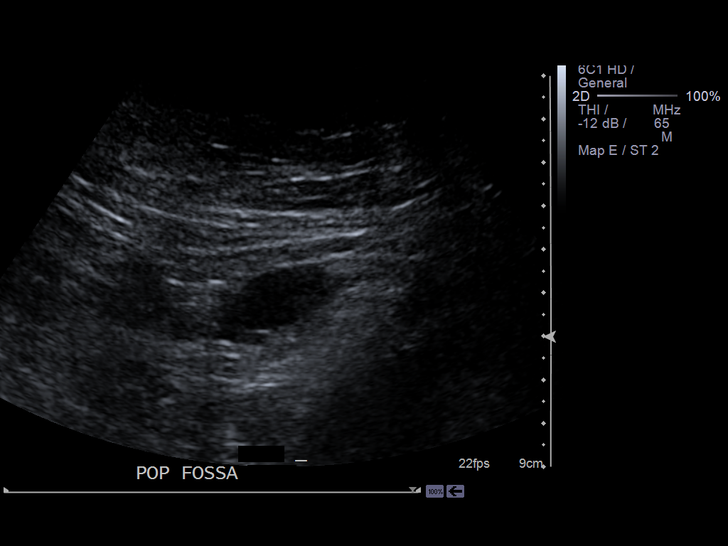

[13 of 24 positions shown; findings below may reference images not displayed]

FINDINGS: Multiple longitudinal and transverse gray-scale as well as color
and spectral Doppler images of  bilateral lower extremity veins were
obtained from the common femoral veins through the popliteal veins.

The right common femoral, greater saphenous, femoral, popliteal veins, and
venous trifurcation are patent, demonstrating normal color-flow and
compressibility. No intraluminal thrombus is identified.There is normal
respiratory variation and augmentation demonstrated at all vein levels.

The left common femoral, greater saphenous popliteal veins, and venous
trifurcation are patent, demonstrating normal color-flow and
compressibility. No intraluminal thrombus is identified.There is normal
respiratory variation and augmentation demonstrated at all vein levels.

The distal aspect of the superficial femoral vein is not well visualized and
is not compressible which may be secondary to overlying edema. There is
color-flow through this area.
IMPRESSION: 1. No evidence of DVT in the right lower extremity.
2. The distal aspect of the superficial femoral vein is not well visualized
and is not compressible which may be secondary to overlying edema. There is
color-flow through this area. Partial the venous thrombosis of the distal
left superficial vein cannot be excluded..

[REDACTED]

## 2014-03-30 ENCOUNTER — Encounter: Payer: Self-pay | Admitting: Internal Medicine

## 2014-04-01 ENCOUNTER — Ambulatory Visit (INDEPENDENT_AMBULATORY_CARE_PROVIDER_SITE_OTHER): Payer: Medicare Other | Admitting: Family Medicine

## 2014-04-01 DIAGNOSIS — Z5181 Encounter for therapeutic drug level monitoring: Secondary | ICD-10-CM

## 2014-04-01 LAB — POCT INR: INR: 2.5

## 2014-04-02 DIAGNOSIS — H25019 Cortical age-related cataract, unspecified eye: Secondary | ICD-10-CM | POA: Diagnosis not present

## 2014-04-08 ENCOUNTER — Other Ambulatory Visit: Payer: Self-pay

## 2014-04-08 ENCOUNTER — Other Ambulatory Visit: Payer: Self-pay | Admitting: Internal Medicine

## 2014-04-08 DIAGNOSIS — I4891 Unspecified atrial fibrillation: Secondary | ICD-10-CM | POA: Diagnosis not present

## 2014-04-08 MED ORDER — ALPRAZOLAM 0.5 MG PO TABS: 0.5000 mg | ORAL_TABLET | Freq: Three times a day (TID) | ORAL | Status: DC | PRN

## 2014-04-08 NOTE — Telephone Encounter (Signed)
rx called into pharmacy

## 2014-04-08 NOTE — Telephone Encounter (Signed)
Already called in

## 2014-04-08 NOTE — Telephone Encounter (Signed)
Okay to fill #90 x 0 but he needs another urine drug screen before next refill

## 2014-04-08 NOTE — Telephone Encounter (Signed)
Micheal [name] pts daughter request refill alprazolam to [facility] [address].Please advise.

## 2014-04-09 ENCOUNTER — Ambulatory Visit (INDEPENDENT_AMBULATORY_CARE_PROVIDER_SITE_OTHER): Payer: Medicare Other | Admitting: Family Medicine

## 2014-04-09 DIAGNOSIS — Z5181 Encounter for therapeutic drug level monitoring: Secondary | ICD-10-CM

## 2014-04-09 LAB — POCT INR: INR: 2.5

## 2014-04-14 DIAGNOSIS — I10 Essential (primary) hypertension: Secondary | ICD-10-CM | POA: Diagnosis not present

## 2014-04-14 DIAGNOSIS — N183 Chronic kidney disease, stage 3 unspecified: Secondary | ICD-10-CM | POA: Diagnosis not present

## 2014-04-14 DIAGNOSIS — N2581 Secondary hyperparathyroidism of renal origin: Secondary | ICD-10-CM | POA: Diagnosis not present

## 2014-04-14 DIAGNOSIS — I5022 Chronic systolic (congestive) heart failure: Secondary | ICD-10-CM | POA: Diagnosis not present

## 2014-04-15 ENCOUNTER — Ambulatory Visit (INDEPENDENT_AMBULATORY_CARE_PROVIDER_SITE_OTHER): Payer: Medicare Other | Admitting: Family Medicine

## 2014-04-15 DIAGNOSIS — Z5181 Encounter for therapeutic drug level monitoring: Secondary | ICD-10-CM

## 2014-04-15 LAB — POCT INR: INR: 2.6

## 2014-04-22 ENCOUNTER — Ambulatory Visit: Payer: Medicare Other | Admitting: Family Medicine

## 2014-04-22 DIAGNOSIS — Z5181 Encounter for therapeutic drug level monitoring: Secondary | ICD-10-CM

## 2014-04-22 LAB — POCT INR: INR: 2.9

## 2014-04-28 LAB — PROTIME-INR

## 2014-04-29 ENCOUNTER — Encounter: Payer: Self-pay | Admitting: Internal Medicine

## 2014-04-30 ENCOUNTER — Ambulatory Visit (INDEPENDENT_AMBULATORY_CARE_PROVIDER_SITE_OTHER): Payer: Medicare Other | Admitting: Family Medicine

## 2014-04-30 DIAGNOSIS — Z5181 Encounter for therapeutic drug level monitoring: Secondary | ICD-10-CM

## 2014-04-30 LAB — POCT INR: INR: 3.3

## 2014-05-06 ENCOUNTER — Other Ambulatory Visit: Payer: Self-pay

## 2014-05-06 ENCOUNTER — Other Ambulatory Visit: Payer: Self-pay | Admitting: Internal Medicine

## 2014-05-06 DIAGNOSIS — I4891 Unspecified atrial fibrillation: Secondary | ICD-10-CM | POA: Diagnosis not present

## 2014-05-06 MED ORDER — ALPRAZOLAM 0.5 MG PO TABS: 0.5000 mg | ORAL_TABLET | Freq: Three times a day (TID) | ORAL | Status: DC | PRN

## 2014-05-06 NOTE — Telephone Encounter (Signed)
rx called into pharmacy

## 2014-05-06 NOTE — Telephone Encounter (Signed)
Micheal [name] pts daughter left v/m requesting refill xanax to [facility] [address]. Pt will be out of med this weekend. Please advise.

## 2014-05-06 NOTE — Addendum Note (Signed)
Addended by: Despina Hidden on: 05/06/2014 03:36 PM   Modules accepted: Orders

## 2014-05-06 NOTE — Telephone Encounter (Signed)
Okay #90 x 0 but he needs to come in for screen when Rx picked up

## 2014-05-06 NOTE — Telephone Encounter (Addendum)
Daughter called again about the refill request, spoke with Vaughan Basta and linda advised daughter that pt needs to come in to update his drug screen and daughter did not like that and just wanted the medication called in. [facility] and since then got 2 more requests from the daughter. Per daughter she will bring pt in tomorrow.   I also spoke with [facility] and canceled the prescription, they also state pt is 3 days early. I will print rx to be signed.

## 2014-05-07 ENCOUNTER — Encounter: Payer: Self-pay | Admitting: Internal Medicine

## 2014-05-07 ENCOUNTER — Telehealth: Payer: Self-pay | Admitting: *Deleted

## 2014-05-07 ENCOUNTER — Ambulatory Visit (INDEPENDENT_AMBULATORY_CARE_PROVIDER_SITE_OTHER): Payer: Medicare Other | Admitting: Family Medicine

## 2014-05-07 DIAGNOSIS — N2581 Secondary hyperparathyroidism of renal origin: Secondary | ICD-10-CM | POA: Diagnosis not present

## 2014-05-07 DIAGNOSIS — Z5181 Encounter for therapeutic drug level monitoring: Secondary | ICD-10-CM

## 2014-05-07 DIAGNOSIS — R809 Proteinuria, unspecified: Secondary | ICD-10-CM | POA: Diagnosis not present

## 2014-05-07 DIAGNOSIS — N183 Chronic kidney disease, stage 3 unspecified: Secondary | ICD-10-CM | POA: Diagnosis not present

## 2014-05-07 DIAGNOSIS — M109 Gout, unspecified: Secondary | ICD-10-CM | POA: Diagnosis not present

## 2014-05-07 DIAGNOSIS — Z79899 Other long term (current) drug therapy: Secondary | ICD-10-CM | POA: Diagnosis not present

## 2014-05-07 LAB — POCT INR: INR: 1.9

## 2014-05-07 MED ORDER — ALPRAZOLAM 0.5 MG PO TABS
0.5000 mg | ORAL_TABLET | Freq: Three times a day (TID) | ORAL | Status: DC | PRN
Start: 2014-05-07 — End: 2014-05-14

## 2014-05-07 NOTE — Telephone Encounter (Signed)
Please let them know that I will be unable to fill the controlled substances anymore

## 2014-05-07 NOTE — Telephone Encounter (Signed)
Pt came in with his daughter to pick up a controlled substance rx. Marlynn Perking. Informed the pt's daughter that she needed to wait in the main lobby while he came back. Vaughan Basta then saw the pt and his daughter come back to the the lab waiting area. Vaughan Basta came back, and instructed me to make sure the pt went into the restroom by himself without his daughter. I called pt back, his daughter followed, and I had him sign a new controlled substance contract. I then handed him the urine cup and walked him into the restroom, and shut the door behind him. I instructed his daughter to have a seat back in the lab waiting area, which she did. I walked back and forth between the lab, and restroom door to monitor when the urine was put in the window. I heard the urine being put in the window, so I walked back out to the restroom door to make sure he left the restroom by himself. When the door opened his daughter exited the restroom first with him following behind her. They then proceeded back to the main lobby. I informed Karena Addison that although I put the pt in the restroom by himself, and he had not asked his daughter for help while I was present. I also did not hear him ask for help from inside the restroom.

## 2014-05-07 NOTE — Telephone Encounter (Signed)
I spoke with Micheal [name] and she can't prove but she thinks the urine may be tampered with? Please advise.  Dr. Diona Browner signed the rx because they wanted it but she only did it for #30 not #90.

## 2014-05-07 NOTE — Telephone Encounter (Signed)
Spoke with daughter and she states she went into the bathroom to help pull patient's pants up, I advised that she should have had the lab help with that and daughter states she didn't see a problem with it. I advised that drug screen are very serious and only one person should be in the bathroom at a time. Daughter would like a call back from Dr. Silvio Pate

## 2014-05-07 NOTE — Addendum Note (Signed)
Addended by: Carter Kitten on: 05/07/2014 12:29 PM   Modules accepted: Orders

## 2014-05-07 NOTE — Telephone Encounter (Signed)
Spoke with daughter and advised her of the lab results from [facility] 05/08/13 alprazolam was negative and the a-Hydroxyalprazolam was negative value was 63.2, the 03/02/14 screening showed no alprazolam at all both negative. Per daughter she states pt is taking xanax three times a day, I advised pt does not have any xanax in his system, per daughter " I can't help what that drug screen says, he's taking his medication" per daughter she would like to speak directly to Dr. Silvio Pate.

## 2014-05-07 NOTE — Telephone Encounter (Signed)
She is adamant that she wasn't told she couldn't go in with him and denies that she was told that the urine was even for drug testing.  I offered her the chance to bring him back (tomorrow only) to repeat the testing without her presence when the specimen is received. Even if that test shows the xanax, he will remain high risk for now

## 2014-05-08 ENCOUNTER — Encounter: Payer: Self-pay | Admitting: Internal Medicine

## 2014-05-12 LAB — PROTIME-INR

## 2014-05-13 ENCOUNTER — Ambulatory Visit (INDEPENDENT_AMBULATORY_CARE_PROVIDER_SITE_OTHER): Payer: Medicare Other | Admitting: Family Medicine

## 2014-05-13 ENCOUNTER — Encounter: Payer: Self-pay | Admitting: Internal Medicine

## 2014-05-13 DIAGNOSIS — Z5181 Encounter for therapeutic drug level monitoring: Secondary | ICD-10-CM

## 2014-05-13 LAB — POCT INR: INR: 2.6

## 2014-05-13 NOTE — Telephone Encounter (Signed)
Please see toxicology results on your desk, xanax still negative and pt positive for cyclobenzaprine which was last prescribed 05/08/13.  Daughter left VM asking if you couldn't prescribe xanax what could you prescribe? Please advise

## 2014-05-14 MED ORDER — ALPRAZOLAM 0.5 MG PO TABS: 0.5000 mg | ORAL_TABLET | Freq: Three times a day (TID) | ORAL | Status: DC | PRN

## 2014-05-14 NOTE — Telephone Encounter (Signed)
Spoke with Landry Mellow from [facility] and he will stop by tomorrow to explain the results, he did say it's not possible for it to be negative if he's taking it 3 times daily.

## 2014-05-14 NOTE — Addendum Note (Signed)
Addended by: Tammi Sou on: 05/14/2014 04:06 PM   Modules accepted: Orders

## 2014-05-14 NOTE — Telephone Encounter (Signed)
He does have some in his system, just below the threshold Okay to refill #90 x 0 Continue in high risk category

## 2014-05-14 NOTE — Telephone Encounter (Signed)
Daughter notified of Dr. Alla German comments and Rx called in as prescribed

## 2014-05-14 NOTE — Telephone Encounter (Signed)
Please check with the company. Is it possible for him to be taking it three times every day and still be negative

## 2014-05-20 ENCOUNTER — Encounter: Payer: Self-pay | Admitting: Internal Medicine

## 2014-05-20 LAB — PROTIME-INR

## 2014-05-26 ENCOUNTER — Other Ambulatory Visit: Payer: Self-pay | Admitting: *Deleted

## 2014-05-26 ENCOUNTER — Encounter: Payer: Self-pay | Admitting: Internal Medicine

## 2014-05-26 NOTE — Telephone Encounter (Signed)
Daughter [name]) called requesting a refill on Vicodin. Last refill 03/02/14 #120, last office visit 03/02/14. Call when ready for pickup.

## 2014-05-26 NOTE — Telephone Encounter (Signed)
This is too soon You can prepare this script for me on Thursday (since I will then be out until 9/8)

## 2014-05-27 MED ORDER — HYDROCODONE-ACETAMINOPHEN 10-325 MG PO TABS: 1.0000 | ORAL_TABLET | Freq: Four times a day (QID) | ORAL | Status: DC | PRN

## 2014-05-27 NOTE — Telephone Encounter (Signed)
Let them know they can pick this up tomorrow

## 2014-05-27 NOTE — Telephone Encounter (Signed)
Left message on daughter's cell VM that she can pick up rx tomorrow

## 2014-05-28 ENCOUNTER — Ambulatory Visit (INDEPENDENT_AMBULATORY_CARE_PROVIDER_SITE_OTHER): Payer: Medicare Other | Admitting: Family Medicine

## 2014-05-28 DIAGNOSIS — Z5181 Encounter for therapeutic drug level monitoring: Secondary | ICD-10-CM

## 2014-05-28 LAB — POCT INR: INR: 3

## 2014-06-02 DIAGNOSIS — I428 Other cardiomyopathies: Secondary | ICD-10-CM | POA: Diagnosis not present

## 2014-06-02 DIAGNOSIS — I10 Essential (primary) hypertension: Secondary | ICD-10-CM | POA: Diagnosis not present

## 2014-06-02 DIAGNOSIS — I4891 Unspecified atrial fibrillation: Secondary | ICD-10-CM | POA: Diagnosis not present

## 2014-06-02 DIAGNOSIS — I519 Heart disease, unspecified: Secondary | ICD-10-CM | POA: Diagnosis not present

## 2014-06-02 DIAGNOSIS — I442 Atrioventricular block, complete: Secondary | ICD-10-CM | POA: Diagnosis not present

## 2014-06-02 DIAGNOSIS — I5022 Chronic systolic (congestive) heart failure: Secondary | ICD-10-CM | POA: Diagnosis not present

## 2014-06-04 ENCOUNTER — Ambulatory Visit (INDEPENDENT_AMBULATORY_CARE_PROVIDER_SITE_OTHER): Payer: Medicare Other | Admitting: Family Medicine

## 2014-06-04 DIAGNOSIS — Z5181 Encounter for therapeutic drug level monitoring: Secondary | ICD-10-CM

## 2014-06-04 DIAGNOSIS — I4891 Unspecified atrial fibrillation: Secondary | ICD-10-CM | POA: Diagnosis not present

## 2014-06-04 LAB — POCT INR: INR: 2.6

## 2014-06-11 ENCOUNTER — Ambulatory Visit (INDEPENDENT_AMBULATORY_CARE_PROVIDER_SITE_OTHER): Payer: Medicare Other | Admitting: Family Medicine

## 2014-06-11 DIAGNOSIS — Z5181 Encounter for therapeutic drug level monitoring: Secondary | ICD-10-CM

## 2014-06-11 LAB — POCT INR: INR: 2.8

## 2014-06-12 ENCOUNTER — Other Ambulatory Visit: Payer: Self-pay

## 2014-06-12 MED ORDER — ALPRAZOLAM 0.5 MG PO TABS: 0.5000 mg | ORAL_TABLET | Freq: Three times a day (TID) | ORAL | Status: DC | PRN

## 2014-06-12 NOTE — Telephone Encounter (Signed)
rx called into pharmacy

## 2014-06-12 NOTE — Telephone Encounter (Signed)
[name] pts daughter left v/m requesting refill xanax to [facility] [address].Please advise.

## 2014-06-12 NOTE — Telephone Encounter (Signed)
Okay #90 x 0 

## 2014-06-17 ENCOUNTER — Ambulatory Visit (INDEPENDENT_AMBULATORY_CARE_PROVIDER_SITE_OTHER): Payer: Medicare Other | Admitting: Family Medicine

## 2014-06-17 DIAGNOSIS — Z5181 Encounter for therapeutic drug level monitoring: Secondary | ICD-10-CM

## 2014-06-17 LAB — POCT INR: INR: 2.8

## 2014-06-24 ENCOUNTER — Encounter: Payer: Self-pay | Admitting: Internal Medicine

## 2014-06-24 LAB — PROTIME-INR

## 2014-06-30 DIAGNOSIS — I4891 Unspecified atrial fibrillation: Secondary | ICD-10-CM | POA: Diagnosis not present

## 2014-06-30 LAB — PROTIME-INR

## 2014-07-01 ENCOUNTER — Encounter: Payer: Self-pay | Admitting: Internal Medicine

## 2014-07-08 ENCOUNTER — Telehealth: Payer: Self-pay | Admitting: Internal Medicine

## 2014-07-08 ENCOUNTER — Ambulatory Visit (INDEPENDENT_AMBULATORY_CARE_PROVIDER_SITE_OTHER): Payer: Medicare Other | Admitting: *Deleted

## 2014-07-08 DIAGNOSIS — Z5181 Encounter for therapeutic drug level monitoring: Secondary | ICD-10-CM

## 2014-07-08 LAB — POCT INR: INR: 2.5

## 2014-07-08 NOTE — Telephone Encounter (Signed)
07/08/14 INR results from [place].  INR-2.5.

## 2014-07-13 ENCOUNTER — Other Ambulatory Visit: Payer: Self-pay | Admitting: Internal Medicine

## 2014-07-13 ENCOUNTER — Other Ambulatory Visit: Payer: Self-pay

## 2014-07-13 MED ORDER — ALPRAZOLAM 0.5 MG PO TABS: 0.5000 mg | ORAL_TABLET | Freq: Three times a day (TID) | ORAL | Status: DC | PRN

## 2014-07-13 NOTE — Telephone Encounter (Signed)
Okay #90 x 0 

## 2014-07-13 NOTE — Telephone Encounter (Signed)
rx called into pharmacy

## 2014-07-13 NOTE — Telephone Encounter (Signed)
Pt's daughter,[name] request refill xanax to [facility] [address].Please advise.

## 2014-07-17 ENCOUNTER — Ambulatory Visit (INDEPENDENT_AMBULATORY_CARE_PROVIDER_SITE_OTHER): Payer: Medicare Other | Admitting: *Deleted

## 2014-07-17 ENCOUNTER — Telehealth: Payer: Self-pay | Admitting: Internal Medicine

## 2014-07-17 DIAGNOSIS — Z5181 Encounter for therapeutic drug level monitoring: Secondary | ICD-10-CM | POA: Diagnosis not present

## 2014-07-17 LAB — POCT INR: INR: 2.6

## 2014-07-17 NOTE — Telephone Encounter (Signed)
[facility] faxed INR results for 07/16/14. INR-2.6.

## 2014-07-24 LAB — PROTIME-INR

## 2014-07-27 ENCOUNTER — Encounter: Payer: Self-pay | Admitting: Internal Medicine

## 2014-07-30 DIAGNOSIS — I4891 Unspecified atrial fibrillation: Secondary | ICD-10-CM | POA: Diagnosis not present

## 2014-07-30 LAB — PROTIME-INR

## 2014-07-31 ENCOUNTER — Encounter: Payer: Self-pay | Admitting: Internal Medicine

## 2014-08-07 LAB — PROTIME-INR

## 2014-08-12 ENCOUNTER — Encounter: Payer: Self-pay | Admitting: Internal Medicine

## 2014-08-12 ENCOUNTER — Other Ambulatory Visit: Payer: Self-pay

## 2014-08-12 NOTE — Telephone Encounter (Signed)
[name] pts daughter left v/m requesting refill alprazolam to [facility] [address] and rx hydrocodone apap. Call when ready for pick up. Pt has appt scheduled to see Dr Silvio Pate on 09/03/14.

## 2014-08-13 MED ORDER — ALPRAZOLAM 0.5 MG PO TABS: 0.5000 mg | ORAL_TABLET | Freq: Three times a day (TID) | ORAL | Status: DC | PRN

## 2014-08-13 MED ORDER — HYDROCODONE-ACETAMINOPHEN 10-325 MG PO TABS: 1.0000 | ORAL_TABLET | Freq: Four times a day (QID) | ORAL | Status: DC | PRN

## 2014-08-13 NOTE — Telephone Encounter (Signed)
Phone in the alprazolam please

## 2014-08-13 NOTE — Telephone Encounter (Signed)
rx called into pharmacy Left message on machine that rx is ready for pick-up, and it will be at our front desk.

## 2014-08-17 LAB — PROTIME-INR: INR: 2.7 — AB (ref 0.9–1.1)

## 2014-08-19 ENCOUNTER — Encounter: Payer: Self-pay | Admitting: Internal Medicine

## 2014-08-26 LAB — PROTIME-INR

## 2014-08-27 ENCOUNTER — Encounter: Payer: Self-pay | Admitting: Internal Medicine

## 2014-09-03 ENCOUNTER — Ambulatory Visit (INDEPENDENT_AMBULATORY_CARE_PROVIDER_SITE_OTHER): Payer: Medicare Other | Admitting: Internal Medicine

## 2014-09-03 ENCOUNTER — Encounter: Payer: Self-pay | Admitting: Internal Medicine

## 2014-09-03 VITALS — BP 112/66 | HR 76 | Temp 97.8°F | Wt 242.5 lb

## 2014-09-03 DIAGNOSIS — I10 Essential (primary) hypertension: Secondary | ICD-10-CM

## 2014-09-03 DIAGNOSIS — I4891 Unspecified atrial fibrillation: Secondary | ICD-10-CM

## 2014-09-03 DIAGNOSIS — F419 Anxiety disorder, unspecified: Secondary | ICD-10-CM | POA: Diagnosis not present

## 2014-09-03 DIAGNOSIS — R809 Proteinuria, unspecified: Secondary | ICD-10-CM | POA: Diagnosis not present

## 2014-09-03 DIAGNOSIS — N184 Chronic kidney disease, stage 4 (severe): Secondary | ICD-10-CM | POA: Diagnosis not present

## 2014-09-03 DIAGNOSIS — Z23 Encounter for immunization: Secondary | ICD-10-CM | POA: Diagnosis not present

## 2014-09-03 DIAGNOSIS — M109 Gout, unspecified: Secondary | ICD-10-CM | POA: Diagnosis not present

## 2014-09-03 DIAGNOSIS — N2581 Secondary hyperparathyroidism of renal origin: Secondary | ICD-10-CM | POA: Diagnosis not present

## 2014-09-03 DIAGNOSIS — I5022 Chronic systolic (congestive) heart failure: Secondary | ICD-10-CM

## 2014-09-03 DIAGNOSIS — M549 Dorsalgia, unspecified: Secondary | ICD-10-CM | POA: Diagnosis not present

## 2014-09-03 DIAGNOSIS — G8929 Other chronic pain: Secondary | ICD-10-CM

## 2014-09-03 NOTE — Assessment & Plan Note (Signed)
Had labs done today with Dr Holley Raring Will wait for his results

## 2014-09-03 NOTE — Assessment & Plan Note (Signed)
Compensated No longer on furosemide shots Neutral fluid status now

## 2014-09-03 NOTE — Assessment & Plan Note (Signed)
BP Readings from Last 3 Encounters:  09/03/14 112/66  03/02/14 110/60  11/03/13 108/70   Fine on heart/kidney regimen

## 2014-09-03 NOTE — Progress Notes (Signed)
Subjective:    Patient ID: Micheal [name], male    DOB: [birth year], 73 y.o.   MRN: [id number]  HPI  Here for follow up with daughter---multiple chronic medical conditions  Has had persistent thick nasal drainage Like "latex"--thick and stretchy Goes back 3-4 months Tried allergy meds---claritin--no help.  Then saline spraywithout help Notices most in the AM but can be all day Discussed trying OTC flonase and we can consider ipratropium if not effective  Fluid status good Is off the shots now--for 2 months or so. 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 ALPRAZolam (XANAX) 0.5 MG tablet Take 1 tablet (0.5 mg total) by mouth 3 (three) times daily as needed. 90 tablet 0  . carvedilol (COREG) 3.125 MG tablet Take 1 tablet (3.125 mg total) by mouth 2 (two) times daily with a meal. 120 tablet 3  . citalopram (CELEXA) 40 MG tablet Take 1 tablet (40 mg total) by mouth daily. 90 tablet 3  . cyclobenzaprine (FLEXERIL) 5 MG tablet Take 1 tablet (5 mg total) by mouth 2 (two) times daily as needed for muscle spasms. 30 tablet 0  . febuxostat (ULORIC) 40 MG tablet Take 2 tablets (80 mg total) by mouth daily. 180 tablet 3  . furosemide (LASIX) 10 MG/ML injection Inject 20 mg into the muscle as needed. Taking twice a week    . furosemide (LASIX) 40 MG tablet Take 80 mg by mouth 5 days a week    . HYDROcodone-acetaminophen (NORCO) 10-325 MG per tablet Take 1 tablet by mouth every 6 (six) hours as needed. 120 tablet 0  . hydrOXYzine (ATARAX/VISTARIL) 50 MG tablet Take 1 tablet (50 mg total) by mouth 3 (three) times  daily as needed. 270 tablet 3  . lisinopril (PRINIVIL,ZESTRIL) 5 MG tablet Take 1 tablet (5 mg total) by mouth daily. 90 tablet 3  . metolazone (ZAROXOLYN) 5 MG tablet Take 5 mg by mouth 2 (two) times daily.     . polyethylene glycol (MIRALAX / GLYCOLAX) packet Take 17 g by mouth daily.    . potassium chloride (K-DUR) 10 MEQ tablet Take 1 tablet (10 mEq total) by mouth daily. 90 tablet 3  . pravastatin (PRAVACHOL) 80 MG tablet Take 1 tablet (80 mg total) by mouth daily. 90 tablet 3  . warfarin (COUMADIN) 1 MG tablet Take 1/2 tablet every other day as directed by [facility] Clinic. 30 tablet 2  . warfarin (COUMADIN) 5 MG tablet Take 1 tablet (5 mg total) by mouth as directed. 90 tablet 3   No current facility-administered medications on file prior to visit.    No Known Allergies  Past Medical History  Diagnosis Date  . Automatic implantable cardiac defibrillator in situ     St. Jude  . Other left bundle branch block   . Atrial fibrillation   . Chronic systolic heart failure ~3546    EF under 25%  . Hypertension   . Anxiety   . CKD (chronic kidney disease) stage 3, GFR 30-59 ml/min 2009  . Gout, unspecified   . Chronic back pain     Past Surgical History  Procedure Laterality Date  .  Icd implant      St. Jude Durata; 12/27/07-CHF, afib, LBB, 3 vessel disease  . Fracture of left humerus  2008    Rebroken 12/13    No family history on file.  History   Social History  . Marital Status: Divorced    Spouse Name: N/A    Number of Children: 2  . Years of Education: N/A   Occupational History  . 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 Smoking status: Former Smoker    Quit date: 09/25/1993  . Smokeless tobacco: Never Used     Comment: quit 15 years ago  . Alcohol Use: No     Comment: past use but not currently  . Drug Use: No  . Sexual Activity: Not on file   Other Topics Concern  . Not on file   Social History Narrative    Divorced   2 daughters   No living will   Requests daughter [name] as health care POA   Would accept resuscitation   Not sure about tube feeds   Review of Systems Appetite is good Careful about eating Weight down 3#    Objective:   Physical Exam  Constitutional: He appears well-developed and well-nourished. No distress.  Neck: Normal range of motion. Neck supple.  Cardiovascular: Normal rate, regular rhythm and normal heart sounds.  Exam reveals no gallop.   No murmur heard. Pulmonary/Chest: Effort normal and breath sounds normal. No respiratory distress. He has no wheezes. He has no rales.  Musculoskeletal: He exhibits no edema or tenderness.  Lymphadenopathy:    He has no cervical adenopathy.  Psychiatric: He has a normal mood and affect. His behavior is normal.          Assessment & Plan:

## 2014-09-03 NOTE — Assessment & Plan Note (Signed)
Controlled with alprazolam tid Has failed all other meds

## 2014-09-03 NOTE — Assessment & Plan Note (Signed)
Uses the hydrocodone bid and occasionally extra Has been doing okay with this

## 2014-09-03 NOTE — Progress Notes (Signed)
Pre visit review using our clinic review tool, if applicable. No additional management support is needed unless otherwise documented below in the visit note. 

## 2014-09-03 NOTE — Assessment & Plan Note (Signed)
Paced On coumadin with home monitoring weekly

## 2014-09-04 ENCOUNTER — Telehealth: Payer: Self-pay | Admitting: Internal Medicine

## 2014-09-04 NOTE — Telephone Encounter (Signed)
emmi emailed °

## 2014-09-06 ENCOUNTER — Inpatient Hospital Stay: Payer: Self-pay | Admitting: Internal Medicine

## 2014-09-06 DIAGNOSIS — F419 Anxiety disorder, unspecified: Secondary | ICD-10-CM | POA: Diagnosis present

## 2014-09-06 DIAGNOSIS — N3001 Acute cystitis with hematuria: Secondary | ICD-10-CM | POA: Diagnosis not present

## 2014-09-06 DIAGNOSIS — N184 Chronic kidney disease, stage 4 (severe): Secondary | ICD-10-CM | POA: Diagnosis not present

## 2014-09-06 DIAGNOSIS — N2889 Other specified disorders of kidney and ureter: Secondary | ICD-10-CM | POA: Diagnosis not present

## 2014-09-06 DIAGNOSIS — R809 Proteinuria, unspecified: Secondary | ICD-10-CM | POA: Diagnosis not present

## 2014-09-06 DIAGNOSIS — I129 Hypertensive chronic kidney disease with stage 1 through stage 4 chronic kidney disease, or unspecified chronic kidney disease: Secondary | ICD-10-CM | POA: Diagnosis not present

## 2014-09-06 DIAGNOSIS — Z7901 Long term (current) use of anticoagulants: Secondary | ICD-10-CM | POA: Diagnosis not present

## 2014-09-06 DIAGNOSIS — Z8673 Personal history of transient ischemic attack (TIA), and cerebral infarction without residual deficits: Secondary | ICD-10-CM | POA: Diagnosis not present

## 2014-09-06 DIAGNOSIS — N2581 Secondary hyperparathyroidism of renal origin: Secondary | ICD-10-CM | POA: Diagnosis present

## 2014-09-06 DIAGNOSIS — I9589 Other hypotension: Secondary | ICD-10-CM | POA: Diagnosis not present

## 2014-09-06 DIAGNOSIS — N19 Unspecified kidney failure: Secondary | ICD-10-CM | POA: Diagnosis not present

## 2014-09-06 DIAGNOSIS — E785 Hyperlipidemia, unspecified: Secondary | ICD-10-CM | POA: Diagnosis present

## 2014-09-06 DIAGNOSIS — I10 Essential (primary) hypertension: Secondary | ICD-10-CM | POA: Diagnosis not present

## 2014-09-06 DIAGNOSIS — I482 Chronic atrial fibrillation: Secondary | ICD-10-CM | POA: Diagnosis present

## 2014-09-06 DIAGNOSIS — Z6834 Body mass index (BMI) 34.0-34.9, adult: Secondary | ICD-10-CM | POA: Diagnosis not present

## 2014-09-06 DIAGNOSIS — N3091 Cystitis, unspecified with hematuria: Secondary | ICD-10-CM | POA: Diagnosis present

## 2014-09-06 DIAGNOSIS — I4891 Unspecified atrial fibrillation: Secondary | ICD-10-CM | POA: Diagnosis not present

## 2014-09-06 DIAGNOSIS — N170 Acute kidney failure with tubular necrosis: Secondary | ICD-10-CM | POA: Diagnosis not present

## 2014-09-06 DIAGNOSIS — F329 Major depressive disorder, single episode, unspecified: Secondary | ICD-10-CM | POA: Diagnosis present

## 2014-09-06 DIAGNOSIS — M109 Gout, unspecified: Secondary | ICD-10-CM | POA: Diagnosis present

## 2014-09-06 DIAGNOSIS — N390 Urinary tract infection, site not specified: Secondary | ICD-10-CM | POA: Diagnosis not present

## 2014-09-06 DIAGNOSIS — N189 Chronic kidney disease, unspecified: Secondary | ICD-10-CM | POA: Diagnosis not present

## 2014-09-06 DIAGNOSIS — E876 Hypokalemia: Secondary | ICD-10-CM | POA: Diagnosis not present

## 2014-09-06 DIAGNOSIS — N179 Acute kidney failure, unspecified: Secondary | ICD-10-CM | POA: Diagnosis not present

## 2014-09-06 DIAGNOSIS — I5022 Chronic systolic (congestive) heart failure: Secondary | ICD-10-CM | POA: Diagnosis not present

## 2014-09-06 DIAGNOSIS — E871 Hypo-osmolality and hyponatremia: Secondary | ICD-10-CM | POA: Diagnosis not present

## 2014-09-06 DIAGNOSIS — N10 Acute tubulo-interstitial nephritis: Secondary | ICD-10-CM | POA: Diagnosis not present

## 2014-09-06 LAB — CBC
HCT: 41.8 % (ref 40.0–52.0)
HGB: 13.3 g/dL (ref 13.0–18.0)
MCH: 27.7 pg (ref 26.0–34.0)
MCHC: 31.9 g/dL — ABNORMAL LOW (ref 32.0–36.0)
MCV: 87 fL (ref 80–100)
PLATELETS: 151 10*3/uL (ref 150–440)
RBC: 4.81 10*6/uL (ref 4.40–5.90)
RDW: 14.3 % (ref 11.5–14.5)
WBC: 23.2 10*3/uL — AB (ref 3.8–10.6)

## 2014-09-06 LAB — COMPREHENSIVE METABOLIC PANEL
ALK PHOS: 58 U/L
ANION GAP: 10 (ref 7–16)
AST: 18 U/L (ref 15–37)
Albumin: 3.6 g/dL (ref 3.4–5.0)
BUN: 132 mg/dL — AB (ref 7–18)
Bilirubin,Total: 1.1 mg/dL — ABNORMAL HIGH (ref 0.2–1.0)
CHLORIDE: 91 mmol/L — AB (ref 98–107)
CREATININE: 4.84 mg/dL — AB (ref 0.60–1.30)
Calcium, Total: 9.5 mg/dL (ref 8.5–10.1)
Co2: 30 mmol/L (ref 21–32)
EGFR (African American): 15 — ABNORMAL LOW
EGFR (Non-African Amer.): 13 — ABNORMAL LOW
Glucose: 118 mg/dL — ABNORMAL HIGH (ref 65–99)
Osmolality: 306 (ref 275–301)
Potassium: 3.3 mmol/L — ABNORMAL LOW (ref 3.5–5.1)
SGPT (ALT): 14 U/L
Sodium: 131 mmol/L — ABNORMAL LOW (ref 136–145)
TOTAL PROTEIN: 7.7 g/dL (ref 6.4–8.2)

## 2014-09-06 LAB — URINALYSIS, COMPLETE
BILIRUBIN, UR: NEGATIVE
Glucose,UR: NEGATIVE mg/dL (ref 0–75)
Hyaline Cast: 2
Ketone: NEGATIVE
Nitrite: POSITIVE
Ph: 5 (ref 4.5–8.0)
Protein: 100
RBC,UR: 120 /HPF (ref 0–5)
SPECIFIC GRAVITY: 1.012 (ref 1.003–1.030)
WBC UR: 342 /HPF (ref 0–5)

## 2014-09-06 LAB — PROTIME-INR
INR: 2.4
Prothrombin Time: 25.8 secs — ABNORMAL HIGH (ref 11.5–14.7)

## 2014-09-07 DIAGNOSIS — I4891 Unspecified atrial fibrillation: Secondary | ICD-10-CM | POA: Diagnosis not present

## 2014-09-07 LAB — CBC WITH DIFFERENTIAL/PLATELET
BASOS ABS: 0 10*3/uL (ref 0.0–0.1)
BASOS PCT: 0.2 %
EOS ABS: 0.1 10*3/uL (ref 0.0–0.7)
Eosinophil %: 0.6 %
HCT: 35.1 % — ABNORMAL LOW (ref 40.0–52.0)
HGB: 11.4 g/dL — ABNORMAL LOW (ref 13.0–18.0)
Lymphocyte #: 0.5 10*3/uL — ABNORMAL LOW (ref 1.0–3.6)
Lymphocyte %: 3.9 %
MCH: 27.8 pg (ref 26.0–34.0)
MCHC: 32.5 g/dL (ref 32.0–36.0)
MCV: 86 fL (ref 80–100)
MONO ABS: 1.5 x10 3/mm — AB (ref 0.2–1.0)
Monocyte %: 10.3 %
NEUTROS PCT: 85 %
Neutrophil #: 12.1 10*3/uL — ABNORMAL HIGH (ref 1.4–6.5)
Platelet: 139 10*3/uL — ABNORMAL LOW (ref 150–440)
RBC: 4.09 10*6/uL — ABNORMAL LOW (ref 4.40–5.90)
RDW: 14.2 % (ref 11.5–14.5)
WBC: 14.2 10*3/uL — ABNORMAL HIGH (ref 3.8–10.6)

## 2014-09-07 LAB — BASIC METABOLIC PANEL
Anion Gap: 8 (ref 7–16)
BUN: 131 mg/dL — AB (ref 7–18)
CALCIUM: 8.7 mg/dL (ref 8.5–10.1)
CO2: 29 mmol/L (ref 21–32)
Chloride: 97 mmol/L — ABNORMAL LOW (ref 98–107)
Creatinine: 4.56 mg/dL — ABNORMAL HIGH (ref 0.60–1.30)
EGFR (African American): 16 — ABNORMAL LOW
EGFR (Non-African Amer.): 14 — ABNORMAL LOW
GLUCOSE: 136 mg/dL — AB (ref 65–99)
Osmolality: 313 (ref 275–301)
POTASSIUM: 3.2 mmol/L — AB (ref 3.5–5.1)
Sodium: 134 mmol/L — ABNORMAL LOW (ref 136–145)

## 2014-09-07 LAB — PROTIME-INR
INR: 2.8
Prothrombin Time: 28.7 secs — ABNORMAL HIGH (ref 11.5–14.7)

## 2014-09-07 LAB — CLOSTRIDIUM DIFFICILE(ARMC)

## 2014-09-08 ENCOUNTER — Encounter: Payer: Self-pay | Admitting: Internal Medicine

## 2014-09-08 LAB — BASIC METABOLIC PANEL
ANION GAP: 10 (ref 7–16)
BUN: 116 mg/dL — ABNORMAL HIGH (ref 7–18)
CALCIUM: 9 mg/dL (ref 8.5–10.1)
CHLORIDE: 101 mmol/L (ref 98–107)
CREATININE: 3.69 mg/dL — AB (ref 0.60–1.30)
Co2: 25 mmol/L (ref 21–32)
EGFR (African American): 21 — ABNORMAL LOW
EGFR (Non-African Amer.): 17 — ABNORMAL LOW
Glucose: 134 mg/dL — ABNORMAL HIGH (ref 65–99)
Osmolality: 311 (ref 275–301)
Potassium: 3.6 mmol/L (ref 3.5–5.1)
SODIUM: 136 mmol/L (ref 136–145)

## 2014-09-08 LAB — PROTIME-INR
INR: 2.5
PROTHROMBIN TIME: 26.1 s — AB (ref 11.5–14.7)

## 2014-09-08 LAB — MAGNESIUM: Magnesium: 2.7 mg/dL — ABNORMAL HIGH

## 2014-09-08 LAB — URINE CULTURE

## 2014-09-09 LAB — CBC WITH DIFFERENTIAL/PLATELET
BASOS ABS: 0 10*3/uL (ref 0.0–0.1)
Basophil %: 0.3 %
Eosinophil #: 0 10*3/uL (ref 0.0–0.7)
Eosinophil %: 0.5 %
HCT: 37.2 % — AB (ref 40.0–52.0)
HGB: 12 g/dL — ABNORMAL LOW (ref 13.0–18.0)
LYMPHS ABS: 0.4 10*3/uL — AB (ref 1.0–3.6)
Lymphocyte %: 5.3 %
MCH: 27.8 pg (ref 26.0–34.0)
MCHC: 32.2 g/dL (ref 32.0–36.0)
MCV: 86 fL (ref 80–100)
MONO ABS: 1 x10 3/mm (ref 0.2–1.0)
MONOS PCT: 13.2 %
Neutrophil #: 6.1 10*3/uL (ref 1.4–6.5)
Neutrophil %: 80.7 %
Platelet: 152 10*3/uL (ref 150–440)
RBC: 4.3 10*6/uL — ABNORMAL LOW (ref 4.40–5.90)
RDW: 14.2 % (ref 11.5–14.5)
WBC: 7.6 10*3/uL (ref 3.8–10.6)

## 2014-09-09 LAB — BASIC METABOLIC PANEL
Anion Gap: 11 (ref 7–16)
BUN: 82 mg/dL — ABNORMAL HIGH (ref 7–18)
CHLORIDE: 103 mmol/L (ref 98–107)
CO2: 23 mmol/L (ref 21–32)
Calcium, Total: 9.3 mg/dL (ref 8.5–10.1)
Creatinine: 2.7 mg/dL — ABNORMAL HIGH (ref 0.60–1.30)
EGFR (African American): 30 — ABNORMAL LOW
GFR CALC NON AF AMER: 25 — AB
Glucose: 135 mg/dL — ABNORMAL HIGH (ref 65–99)
OSMOLALITY: 301 (ref 275–301)
Potassium: 3.8 mmol/L (ref 3.5–5.1)
SODIUM: 137 mmol/L (ref 136–145)

## 2014-09-09 LAB — PROTIME-INR
INR: 2.4
PROTHROMBIN TIME: 25.2 s — AB (ref 11.5–14.7)

## 2014-09-10 LAB — BASIC METABOLIC PANEL
ANION GAP: 6 — AB (ref 7–16)
BUN: 64 mg/dL — AB (ref 7–18)
CO2: 28 mmol/L (ref 21–32)
CREATININE: 2.44 mg/dL — AB (ref 0.60–1.30)
Calcium, Total: 9.2 mg/dL (ref 8.5–10.1)
Chloride: 103 mmol/L (ref 98–107)
EGFR (African American): 34 — ABNORMAL LOW
EGFR (Non-African Amer.): 28 — ABNORMAL LOW
Glucose: 152 mg/dL — ABNORMAL HIGH (ref 65–99)
OSMOLALITY: 295 (ref 275–301)
Potassium: 3.4 mmol/L — ABNORMAL LOW (ref 3.5–5.1)
SODIUM: 137 mmol/L (ref 136–145)

## 2014-09-10 LAB — PROTIME-INR
INR: 2.5
PROTHROMBIN TIME: 26.4 s — AB (ref 11.5–14.7)

## 2014-09-14 ENCOUNTER — Telehealth: Payer: Self-pay | Admitting: Internal Medicine

## 2014-09-14 MED ORDER — ALPRAZOLAM 0.5 MG PO TABS: 0.5000 mg | ORAL_TABLET | Freq: Three times a day (TID) | ORAL | Status: DC | PRN

## 2014-09-14 NOTE — Telephone Encounter (Signed)
rx called into pharmacy

## 2014-09-14 NOTE — Addendum Note (Signed)
Addended by: Despina Hidden on: 09/14/2014 05:04 PM   Modules accepted: Orders

## 2014-09-14 NOTE — Telephone Encounter (Signed)
Got discharge summary Spoke to daughter Is doing better --now at home Did start getting some edema so called nephrologists and Dr Candiss Norse instructed him to go back on furosemide 40mg  bid. No other changes till renal function recheck on 12/23 there (but no MD visit) Will set up recheck here next week  She notes he is due for his alprazolam  Dee, Please call in #90 x 0

## 2014-09-16 DIAGNOSIS — N184 Chronic kidney disease, stage 4 (severe): Secondary | ICD-10-CM | POA: Diagnosis not present

## 2014-09-16 DIAGNOSIS — I10 Essential (primary) hypertension: Secondary | ICD-10-CM | POA: Diagnosis not present

## 2014-09-16 DIAGNOSIS — R809 Proteinuria, unspecified: Secondary | ICD-10-CM | POA: Diagnosis not present

## 2014-09-22 ENCOUNTER — Ambulatory Visit (INDEPENDENT_AMBULATORY_CARE_PROVIDER_SITE_OTHER): Payer: Medicare Other | Admitting: Family Medicine

## 2014-09-22 ENCOUNTER — Encounter: Payer: Self-pay | Admitting: Internal Medicine

## 2014-09-22 ENCOUNTER — Ambulatory Visit (INDEPENDENT_AMBULATORY_CARE_PROVIDER_SITE_OTHER): Payer: Medicare Other | Admitting: Internal Medicine

## 2014-09-22 VITALS — BP 118/70 | HR 75 | Temp 98.2°F | Wt 249.0 lb

## 2014-09-22 DIAGNOSIS — N184 Chronic kidney disease, stage 4 (severe): Secondary | ICD-10-CM

## 2014-09-22 DIAGNOSIS — I5022 Chronic systolic (congestive) heart failure: Secondary | ICD-10-CM

## 2014-09-22 DIAGNOSIS — Z5181 Encounter for therapeutic drug level monitoring: Secondary | ICD-10-CM | POA: Diagnosis not present

## 2014-09-22 DIAGNOSIS — I4819 Other persistent atrial fibrillation: Secondary | ICD-10-CM

## 2014-09-22 DIAGNOSIS — N400 Enlarged prostate without lower urinary tract symptoms: Secondary | ICD-10-CM | POA: Insufficient documentation

## 2014-09-22 DIAGNOSIS — I4891 Unspecified atrial fibrillation: Secondary | ICD-10-CM

## 2014-09-22 LAB — POCT INR: INR: 2

## 2014-09-22 NOTE — Assessment & Plan Note (Signed)
Seems to be back as stage 3 based on last week's blood work On decreased lisinopril dose now ??bladder infection

## 2014-09-22 NOTE — Assessment & Plan Note (Signed)
Compensated with higher dose of furosemide at times

## 2014-09-22 NOTE — Assessment & Plan Note (Signed)
Seemed to have had some urinary retention so started on tamsulosin Tolerating so far

## 2014-09-22 NOTE — Progress Notes (Signed)
Subjective:    Patient ID: Micheal [name], male    DOB: [birth year], 73 y.o.   MRN: [id number]  HPI Here with daughter for hospital follow up  Had acute on top of chronic renal failure Seemed to improve after antibiotics Lisinopril dose was decreased  Feeling better now Balance off some as he went home--but improving every day Feels he is walking better than he has for a while Still some adjustment when he first stands up  No acute confusion now No SOB that is different---still gets winded quickly Edema persists Did see Dr Holley Raring for blood work last week---was seen due to the edema. Furosemide increased up to 160mg  a day if needed GFR was 33 then--- up from 15 in the hospital  Current Outpatient Prescriptions on File Prior to Visit  Medication Sig Dispense Refill  . ALPRAZolam (XANAX) 0.5 MG tablet Take 1 tablet (0.5 mg total) by mouth 3 (three) times daily as needed. 90 tablet 0  . calcitRIOL (ROCALTROL) 0.25 MCG capsule Take 0.25 mcg by mouth daily.    . carvedilol (COREG) 3.125 MG tablet Take 1 tablet (3.125 mg total) by mouth 2 (two) times daily with a meal. 120 tablet 3  . citalopram (CELEXA) 40 MG tablet Take 1 tablet (40 mg total) by mouth daily. 90 tablet 3  . cyclobenzaprine (FLEXERIL) 5 MG tablet Take 1 tablet (5 mg total) by mouth 2 (two) times daily as needed for muscle spasms. 30 tablet 0  . febuxostat (ULORIC) 40 MG tablet Take 2 tablets (80 mg total) by mouth daily. 180 tablet 3  . HYDROcodone-acetaminophen (NORCO) 10-325 MG per tablet Take 1 tablet by mouth every 6 (six) hours as needed. 120 tablet 0  . hydrOXYzine (ATARAX/VISTARIL) 50 MG tablet Take 1 tablet (50 mg total) by mouth 3 (three) times daily as needed. 270 tablet 3  . metolazone (ZAROXOLYN) 5 MG tablet Take 5 mg by mouth 2 (two) times daily.     . polyethylene glycol (MIRALAX / GLYCOLAX) packet Take 17 g by mouth daily.    . potassium chloride (K-DUR) 10 MEQ tablet Take 1 tablet (10 mEq total) by mouth  daily. 90 tablet 3  . pravastatin (PRAVACHOL) 80 MG tablet Take 1 tablet (80 mg total) by mouth daily. 90 tablet 3  . warfarin (COUMADIN) 1 MG tablet Take 1/2 tablet every other day as directed by [facility]. 30 tablet 2  . warfarin (COUMADIN) 5 MG tablet Take 1 tablet (5 mg total) by mouth as directed. 90 tablet 3   No current facility-administered medications on file prior to visit.    No Known Allergies  Past Medical History  Diagnosis Date  . Automatic implantable cardiac defibrillator in situ     St. Jude  . Other left bundle branch block   . Atrial fibrillation   . Chronic systolic heart failure ~6834    EF under 25%  . Hypertension   . Anxiety   . CKD (chronic kidney disease) stage 3, GFR 30-59 ml/min 2009  . Gout, unspecified   . Chronic back pain     Past Surgical History  Procedure Laterality Date  . Icd implant      St. Jude Durata; 12/27/07-CHF, afib, LBB, 3 vessel disease  . Fracture of left humerus  2008    Rebroken 12/13    No family history on file.  History   Social History  . Marital Status: Single    Spouse Name: N/A    Number  of Children: 2  . Years of Education: N/A   Occupational History  . 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 Smoking status: Former Smoker    Quit date: 09/25/1993  . Smokeless tobacco: Never Used     Comment: quit 15 years ago  . Alcohol Use: No     Comment: past use but not currently  . Drug Use: No  . Sexual Activity: Not on file   Other Topics Concern  . Not on file   Social History Narrative   Divorced   2 daughters   No living will   Requests daughter [name] as health care POA   Would accept resuscitation   Not sure about tube feeds   Review of Systems Did have one "panic attack" getting into bed. Just lied back and put on the oxygen and it finally improved (put HOB up also) Uses oxygen at night or for naps--has for prn also    Objective:   Physical Exam    Constitutional: He appears well-developed and well-nourished. No distress.  Neck: Normal range of motion. Neck supple.  Cardiovascular: Normal rate, regular rhythm and normal heart sounds.  Exam reveals no gallop.   No murmur heard. Pulmonary/Chest: Effort normal and breath sounds normal. No respiratory distress. He has no wheezes. He has no rales.  Musculoskeletal:  1+ edema in feet  Lymphadenopathy:    He has no cervical adenopathy.  Psychiatric: He has a normal mood and affect. His behavior is normal.          Assessment & Plan:

## 2014-09-22 NOTE — Assessment & Plan Note (Signed)
Still paced On coumadin

## 2014-09-22 NOTE — Progress Notes (Signed)
Pre visit review using our clinic review tool, if applicable. No additional management support is needed unless otherwise documented below in the visit note. 

## 2014-09-23 ENCOUNTER — Encounter: Payer: Self-pay | Admitting: Internal Medicine

## 2014-09-29 ENCOUNTER — Ambulatory Visit (INDEPENDENT_AMBULATORY_CARE_PROVIDER_SITE_OTHER): Payer: Medicare Other | Admitting: Family Medicine

## 2014-09-29 DIAGNOSIS — I4819 Other persistent atrial fibrillation: Secondary | ICD-10-CM

## 2014-09-29 DIAGNOSIS — Z5181 Encounter for therapeutic drug level monitoring: Secondary | ICD-10-CM | POA: Diagnosis not present

## 2014-09-29 DIAGNOSIS — I481 Persistent atrial fibrillation: Secondary | ICD-10-CM

## 2014-09-29 LAB — POCT INR: INR: 2.1

## 2014-10-07 ENCOUNTER — Ambulatory Visit (INDEPENDENT_AMBULATORY_CARE_PROVIDER_SITE_OTHER): Payer: Medicare Other | Admitting: Family Medicine

## 2014-10-07 ENCOUNTER — Telehealth: Payer: Self-pay | Admitting: Internal Medicine

## 2014-10-07 DIAGNOSIS — Z5181 Encounter for therapeutic drug level monitoring: Secondary | ICD-10-CM

## 2014-10-07 DIAGNOSIS — I481 Persistent atrial fibrillation: Secondary | ICD-10-CM

## 2014-10-07 DIAGNOSIS — I4819 Other persistent atrial fibrillation: Secondary | ICD-10-CM

## 2014-10-07 LAB — POCT INR: INR: 2.1

## 2014-10-07 NOTE — Telephone Encounter (Signed)
INR results from [facility].  INR-2.1 done on 10/07/14.

## 2014-10-07 NOTE — Telephone Encounter (Signed)
See anticoag encounter

## 2014-10-09 DIAGNOSIS — I5022 Chronic systolic (congestive) heart failure: Secondary | ICD-10-CM

## 2014-10-09 DIAGNOSIS — N184 Chronic kidney disease, stage 4 (severe): Secondary | ICD-10-CM

## 2014-10-09 DIAGNOSIS — I481 Persistent atrial fibrillation: Secondary | ICD-10-CM | POA: Diagnosis not present

## 2014-10-09 DIAGNOSIS — N400 Enlarged prostate without lower urinary tract symptoms: Secondary | ICD-10-CM | POA: Diagnosis not present

## 2014-10-10 ENCOUNTER — Other Ambulatory Visit: Payer: Self-pay | Admitting: Internal Medicine

## 2014-10-12 DIAGNOSIS — I10 Essential (primary) hypertension: Secondary | ICD-10-CM | POA: Diagnosis not present

## 2014-10-12 DIAGNOSIS — R809 Proteinuria, unspecified: Secondary | ICD-10-CM | POA: Diagnosis not present

## 2014-10-12 DIAGNOSIS — I5022 Chronic systolic (congestive) heart failure: Secondary | ICD-10-CM | POA: Diagnosis not present

## 2014-10-12 DIAGNOSIS — N184 Chronic kidney disease, stage 4 (severe): Secondary | ICD-10-CM | POA: Diagnosis not present

## 2014-10-12 DIAGNOSIS — N2581 Secondary hyperparathyroidism of renal origin: Secondary | ICD-10-CM | POA: Diagnosis not present

## 2014-10-13 DIAGNOSIS — I5022 Chronic systolic (congestive) heart failure: Secondary | ICD-10-CM | POA: Diagnosis not present

## 2014-10-13 DIAGNOSIS — I482 Chronic atrial fibrillation: Secondary | ICD-10-CM | POA: Diagnosis not present

## 2014-10-13 DIAGNOSIS — E782 Mixed hyperlipidemia: Secondary | ICD-10-CM | POA: Diagnosis not present

## 2014-10-13 DIAGNOSIS — I10 Essential (primary) hypertension: Secondary | ICD-10-CM | POA: Diagnosis not present

## 2014-10-14 ENCOUNTER — Encounter: Payer: Self-pay | Admitting: Internal Medicine

## 2014-10-14 DIAGNOSIS — I4891 Unspecified atrial fibrillation: Secondary | ICD-10-CM | POA: Diagnosis not present

## 2014-10-14 LAB — PROTIME-INR

## 2014-10-20 ENCOUNTER — Other Ambulatory Visit: Payer: Self-pay | Admitting: *Deleted

## 2014-10-20 ENCOUNTER — Encounter: Payer: Self-pay | Admitting: Internal Medicine

## 2014-10-20 LAB — PROTIME-INR

## 2014-10-20 MED ORDER — HYDROXYZINE HCL 50 MG PO TABS: 50.0000 mg | ORAL_TABLET | Freq: Three times a day (TID) | ORAL | Status: DC | PRN

## 2014-10-20 NOTE — Telephone Encounter (Signed)
Dr.Letvak can you please do these ASAP, I forgot to put these in.

## 2014-10-21 ENCOUNTER — Telehealth: Payer: Self-pay | Admitting: Internal Medicine

## 2014-10-21 MED ORDER — ALPRAZOLAM 0.5 MG PO TABS: 0.5000 mg | ORAL_TABLET | Freq: Three times a day (TID) | ORAL | Status: DC | PRN

## 2014-10-21 MED ORDER — HYDROCODONE-ACETAMINOPHEN 10-325 MG PO TABS: 1.0000 | ORAL_TABLET | Freq: Four times a day (QID) | ORAL | Status: DC | PRN

## 2014-10-21 NOTE — Telephone Encounter (Signed)
Okay to phone in the alprazolam

## 2014-10-21 NOTE — Telephone Encounter (Signed)
Rx called in to pharmacy. 

## 2014-10-22 NOTE — Telephone Encounter (Signed)
Next appt 04/15/15

## 2014-10-22 NOTE — Telephone Encounter (Signed)
Approved:Just done 1/27

## 2014-10-22 NOTE — Telephone Encounter (Signed)
Patient's daughter returned Chan's call.  Call [place] back at [phone].

## 2014-10-22 NOTE — Telephone Encounter (Signed)
Called in rx. Try to call Dana Allan (daughter) that it has been called in.

## 2014-10-27 DIAGNOSIS — I10 Essential (primary) hypertension: Secondary | ICD-10-CM | POA: Diagnosis not present

## 2014-10-27 DIAGNOSIS — R809 Proteinuria, unspecified: Secondary | ICD-10-CM | POA: Diagnosis not present

## 2014-10-27 DIAGNOSIS — I5022 Chronic systolic (congestive) heart failure: Secondary | ICD-10-CM | POA: Diagnosis not present

## 2014-10-27 DIAGNOSIS — N2581 Secondary hyperparathyroidism of renal origin: Secondary | ICD-10-CM | POA: Diagnosis not present

## 2014-10-27 DIAGNOSIS — N184 Chronic kidney disease, stage 4 (severe): Secondary | ICD-10-CM | POA: Diagnosis not present

## 2014-10-27 LAB — PROTIME-INR

## 2014-10-28 ENCOUNTER — Encounter: Payer: Self-pay | Admitting: Internal Medicine

## 2014-10-29 DIAGNOSIS — I5022 Chronic systolic (congestive) heart failure: Secondary | ICD-10-CM | POA: Diagnosis not present

## 2014-10-29 DIAGNOSIS — I10 Essential (primary) hypertension: Secondary | ICD-10-CM | POA: Diagnosis not present

## 2014-10-29 DIAGNOSIS — N2581 Secondary hyperparathyroidism of renal origin: Secondary | ICD-10-CM | POA: Diagnosis not present

## 2014-10-29 DIAGNOSIS — N184 Chronic kidney disease, stage 4 (severe): Secondary | ICD-10-CM | POA: Diagnosis not present

## 2014-10-29 DIAGNOSIS — R809 Proteinuria, unspecified: Secondary | ICD-10-CM | POA: Diagnosis not present

## 2014-11-03 ENCOUNTER — Encounter: Payer: Self-pay | Admitting: Internal Medicine

## 2014-11-03 LAB — PROTIME-INR

## 2014-11-11 DIAGNOSIS — I4891 Unspecified atrial fibrillation: Secondary | ICD-10-CM | POA: Diagnosis not present

## 2014-11-11 LAB — PROTIME-INR

## 2014-11-12 ENCOUNTER — Encounter: Payer: Self-pay | Admitting: Internal Medicine

## 2014-11-12 DIAGNOSIS — R682 Dry mouth, unspecified: Secondary | ICD-10-CM | POA: Diagnosis not present

## 2014-11-12 DIAGNOSIS — J340 Abscess, furuncle and carbuncle of nose: Secondary | ICD-10-CM | POA: Diagnosis not present

## 2014-11-18 ENCOUNTER — Other Ambulatory Visit: Payer: Self-pay

## 2014-11-18 LAB — PROTIME-INR

## 2014-11-18 MED ORDER — ALPRAZOLAM 0.5 MG PO TABS: 0.5000 mg | ORAL_TABLET | Freq: Three times a day (TID) | ORAL | Status: DC | PRN

## 2014-11-18 NOTE — Telephone Encounter (Signed)
Approved: #[id number] x 0 

## 2014-11-18 NOTE — Telephone Encounter (Signed)
Xanax called into [facility].  [name] aware.

## 2014-11-18 NOTE — Telephone Encounter (Signed)
pts daughter left /vm requesting refill alprazolam to [facility] [address]; pt last seen 09/22/14 and rx last printed 10/22/14.Please advise.

## 2014-11-19 ENCOUNTER — Telehealth: Payer: Self-pay

## 2014-11-19 NOTE — Telephone Encounter (Signed)
Left message for patients daughter that PT was fine per Dr Silvio Pate.  Patient to recheck next week.

## 2014-11-24 ENCOUNTER — Encounter: Payer: Self-pay | Admitting: Internal Medicine

## 2014-11-24 LAB — PROTIME-INR

## 2014-11-25 ENCOUNTER — Telehealth: Payer: Self-pay

## 2014-11-25 NOTE — Telephone Encounter (Signed)
-----   Message from Venia Carbon, MD sent at 11/25/2014  8:05 AM EST ----- Please let daughter know protime is fine No medication change needed Recheck next week as usual

## 2014-11-25 NOTE — Telephone Encounter (Signed)
Patient daughter aware of protime, no medication change and recheck next week.

## 2014-11-26 ENCOUNTER — Encounter: Payer: Self-pay | Admitting: Internal Medicine

## 2014-12-01 ENCOUNTER — Encounter: Payer: Self-pay | Admitting: Internal Medicine

## 2014-12-01 LAB — PROTIME-INR

## 2014-12-08 ENCOUNTER — Encounter: Payer: Self-pay | Admitting: Internal Medicine

## 2014-12-08 DIAGNOSIS — I4891 Unspecified atrial fibrillation: Secondary | ICD-10-CM | POA: Diagnosis not present

## 2014-12-08 LAB — PROTIME-INR

## 2014-12-15 ENCOUNTER — Encounter: Payer: Self-pay | Admitting: Internal Medicine

## 2014-12-15 LAB — PROTIME-INR

## 2014-12-17 ENCOUNTER — Other Ambulatory Visit: Payer: Self-pay

## 2014-12-17 MED ORDER — ALPRAZOLAM 0.5 MG PO TABS: 0.5000 mg | ORAL_TABLET | Freq: Three times a day (TID) | ORAL | Status: DC | PRN

## 2014-12-17 NOTE — Telephone Encounter (Signed)
pts daughter [name] left v/m requesting refill alprazolam to [facility] [address]. Pt last seen 09/22/14.Please advise.Dr Silvio Pate out of office and not on computer; pt will be out of med 12/18/14.Please advise.

## 2014-12-22 ENCOUNTER — Encounter: Payer: Self-pay | Admitting: Internal Medicine

## 2014-12-22 LAB — PROTIME-INR

## 2014-12-23 ENCOUNTER — Other Ambulatory Visit: Payer: Self-pay | Admitting: Internal Medicine

## 2014-12-29 LAB — PROTIME-INR

## 2014-12-30 ENCOUNTER — Other Ambulatory Visit: Payer: Self-pay | Admitting: Internal Medicine

## 2014-12-31 DIAGNOSIS — N183 Chronic kidney disease, stage 3 (moderate): Secondary | ICD-10-CM | POA: Diagnosis not present

## 2014-12-31 DIAGNOSIS — R809 Proteinuria, unspecified: Secondary | ICD-10-CM | POA: Diagnosis not present

## 2014-12-31 DIAGNOSIS — N2581 Secondary hyperparathyroidism of renal origin: Secondary | ICD-10-CM | POA: Diagnosis not present

## 2014-12-31 DIAGNOSIS — I5022 Chronic systolic (congestive) heart failure: Secondary | ICD-10-CM | POA: Diagnosis not present

## 2014-12-31 DIAGNOSIS — M109 Gout, unspecified: Secondary | ICD-10-CM | POA: Diagnosis not present

## 2014-12-31 NOTE — Telephone Encounter (Signed)
Last filled 12/17/2014, too soon

## 2015-01-04 ENCOUNTER — Other Ambulatory Visit: Payer: Self-pay | Admitting: *Deleted

## 2015-01-04 MED ORDER — HYDROCODONE-ACETAMINOPHEN 10-325 MG PO TABS: 1.0000 | ORAL_TABLET | Freq: Four times a day (QID) | ORAL | Status: DC | PRN

## 2015-01-04 NOTE — Telephone Encounter (Signed)
10/21/2014 

## 2015-01-04 NOTE — Telephone Encounter (Signed)
Left message on machine that rx is ready for pick-up, and it will be at our front desk.  

## 2015-01-05 ENCOUNTER — Encounter: Payer: Self-pay | Admitting: Internal Medicine

## 2015-01-05 DIAGNOSIS — I4891 Unspecified atrial fibrillation: Secondary | ICD-10-CM | POA: Diagnosis not present

## 2015-01-05 LAB — PROTIME-INR

## 2015-01-06 ENCOUNTER — Telehealth: Payer: Self-pay | Admitting: *Deleted

## 2015-01-06 NOTE — Telephone Encounter (Signed)
Left message for daughter Lafayette Dragon to return call.

## 2015-01-06 NOTE — Telephone Encounter (Signed)
-----   Message from Venia Carbon, MD sent at 01/06/2015  7:50 AM EDT ----- Let daughter know protime is fine Recheck next week as usual

## 2015-01-08 NOTE — Telephone Encounter (Signed)
Spoke to Ms. Wicker, results given.

## 2015-01-08 NOTE — Telephone Encounter (Signed)
Left message for daughter Lafayette Dragon to return call.

## 2015-01-12 ENCOUNTER — Encounter: Payer: Self-pay | Admitting: Internal Medicine

## 2015-01-12 LAB — PROTIME-INR

## 2015-01-12 NOTE — Consult Note (Signed)
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   Patient was seen by consultant.   Consult note dictated.   Comments: Fracture from 2007 treated non-op due to medical problems.  Has never regained use of arm.  L hand dominant.  Fell yesterday and now with some exacerbation of pain in shoulder.  Alaso pain in proximal left lateral thigh.  PE: Left shoulder with very limited mobility. Pain with attempt at motion.  Per patient; motion is his baseline status.  Sensation intact A/M/R/U nerves.  5/5 biceps, triceps, intrinsic hand.  Mild TTP lateral prox humerus.  Mild ecchymosis lateral shoulder  Left hip:  5/5 strength flexion, extension, adduction and abduction.  no pain with motion of joint in any plane.  Swelling proximal lateral thigh.  Compressible, soft.  Moderate TTP.    Xrays hip: no fracture noted. Xrays shoulder: old fracture surgical neck with non-union.  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    Plan:  still need lateral view of shoulder.  Ordered.  Sling for comfort.  No acute intervention needed unless lateral view demonstrates actue problem.  Provided patient with information for 3 shoulder surgeons within 45 minutes of here for consideration of reverse TSA vs. fusion if they think possible and patient medically cleared.  Electronic Signatures: Dawayne Patricia (MD)  (Signed 17-Dec-13 16:36)  Authored: Brief Consult Note   Last Updated: 17-Dec-13 16:36 by Dawayne Patricia (MD)

## 2015-01-12 NOTE — H&P (Signed)
PATIENT NAME:  Micheal [name], Micheal [name] MR#:  [id number] DATE OF BIRTH:  [birth year]  DATE OF ADMISSION:  09/10/2012  PRIMARY CARE PHYSICIAN:  [facility].   CHIEF COMPLAINT:  Fall.   HISTORY OF PRESENT ILLNESS:  This is a very pleasant 74 year old male with history of atrial fibrillation, ischemic cardiomyopathy, ejection fraction of 30%, status post pacemaker and AICD who presents with a fall. The patient fell sometime last night. He said his dog knocked him down and due to his upper body weakness at baseline was unable to pick himself up. He was on the ground all night. His daughter came to check on him this morning, as she usually does, and she found him lying down on the ground. He did hit his head and his left arm. His left arm has a humeral fracture. Over the past 2-1/2 to 3 weeks, the patient has had increasing shortness of breath but denies any chest pain, lower extremity edema, PND, or orthopnea. He had an appointment to see Dr. Nehemiah Massed today for his increasing shortness of breath. In the ER, he had an elevated BNP as well as a chest x-ray which was consistent with pulmonary edema.   REVIEW OF SYSTEMS: CONSTITUTIONAL:  No fever. He has some chills. No fatigue. He has underlying baseline weakness. No weight loss or gain.  HEENT:  _____ blurred vision and glaucoma.  ENT:  No ear pain, hearing loss. No seasonal allergies, no discharge.  RESPIRATORY:  No cough, wheezing, hemoptysis. He does have dyspnea and shortness of breath.  CARDIOVASCULAR:  No chest pain. He has at baseline two-pillow orthopnea. No PND. No edema. He has atrial fibrillation. No palpitations or syncope. GASTROINTESTINAL:  No nausea, vomiting, diarrhea, abdominal pain, melena or ulcers. GENITOURINARY:  No dysuria or hematuria. ENDOCRINE:  No polyuria or polydipsia. HEME/LYMPH:  Positive anemia of chronic disease and easy bruising. SKIN:  No rash or lesion. MUSCULOSKELETAL:  No limited activity at baseline. PSYCHIATRIC:  He does  have anxiety. NEUROLOGIC:  No history of CVA or TIAs.  PAST MEDICAL HISTORY:   1.  History of atrial fibrillation. 2.  History of ischemic cardiomyopathy, EF of 30% status post pacemaker and defibrillator. 3.  Hypertension. 4.  Anxiety with skin crawling.  5.  Vitamin D deficiency.  6.  Hyperlipidemia.  7.  Gout.  8.  Stage 3 chronic kidney disease.  9.  History of chronic back pain.   MEDICATIONS:  1.  Coreg 3.125 b.i.d.  2.  Celexa 40 mg daily. 3.  Clonazepam 1 mg t.i.d. 4.  Hydroxyzine 50 mg t.i.d. 5.  Lisinopril 2.5 mg daily. 6.  KCl 20 mEq daily. 7.  Pravachol 80 mg at bedtime. 8.  Coumadin 5 mg Monday through Friday, and 2.5 mg Saturday and Sunday.  9.  Uloric 40 mg daily.  FAMILY HISTORY:  Positive for CHF, CVA, MI.   SOCIAL HISTORY:  The patient quit smoking over 17 years ago. No alcohol or IV drug use. He lives alone and has 2 daughters nearby.   ALLERGIES:  No known drug allergies.  PHYSICAL EXAMINATION: VITAL SIGNS:  Temperature is 98.1, pulse is 76, respirations 20, blood pressure 163/94, 97% on room air.  GENERAL:  The patient is alert, oriented, not in acute distress.  HEENT:  Head is atraumatic. Pupils are round. Sclerae anicteric. Mucous membranes are dry. Oropharynx is clear.  NECK:  Supple without JVD, carotid bruits, or enlarged thyroid.  CARDIOVASCULAR:  Regular rate and rhythm. There is a 2/6 systolic ejection  murmur heard best at the right sternal border. PMI is hard to palpate due to body habitus.  LUNGS:  Diffuse crackles at the bases. No rhonchi or wheezing are audible. No dullness to percussion.  ABDOMEN:  Bowel sounds are positive. Obese. Hard to appreciate organomegaly due to body habitus.  EXTREMITIES:  No clubbing, cyanosis or edema. NEUROLOGIC:  Cranial nerves II through XII are intact. There are no focal deficits. SKIN:  He has got some subcutaneous swelling in the left hip, but he is able to move those extremities without any difficulty. Skin  is without rash or lesion.  LABORATORY DATA:  Sodium 145, potassium 3.7, chloride 111, bicarb 27, BUN 29, creatinine 1.65, glucose 145, BNP 24,585, total protein 7.2, albumin 3.6, bilirubin 1.3, alkaline phosphatase is 94. ALT is 24, AST 27. CK 244, CK-MB 4.4, troponin 0.07. White blood cells 13.7, hemoglobin 13.9, hematocrit 43, platelets are 189. INR is 2.4. CT of the head without contrast shows no evidence of any intracranial hemorrhage or acute abnormality. Left humeral shows a slightly obstructed proximal left humeral fracture. Chest x-ray shows cardiomegaly and diffuse interstitial edema consistent with congestive heart failure.  EKG shows electronically paced rhythm.   ASSESSMENT:  This is a 74 year old male who has been having shortness of breath for the past 2-1/2 to 3 weeks. He had a mechanical fall, was noted to have acute congestive heart failure.   PLAN: 1.  Acute on chronic systolic congestive heart failure. The patient has had symptoms for the past 2-1/2 to 3 weeks. He had an appointment today actually to see Dr. Nehemiah Massed. Will go head and treat with Lasix 40 IV q.8 hours, continue to monitor I's and O's, daily weights, continue his outpatient medications, have Dr. Nehemiah Massed see the patient and will go head and obtain a TSH as well an echocardiogram.  2.  Elevated troponin. I suspect this could be due to his acute congestive heart failure. The patient denies any chest pain, loss of consciousness. Will continue to monitor carefully.  3.  Left humeral fracture with some left subcutaneous hip swelling. Will check a hip x-ray. I do not think there is an acute fracture. The patient is able to move his left leg. Will have Ortho see the patient in regards to his left humeral fracture. Likely this is nonoperative and will continue with sling and pain medications. Will also obtain a Physical Therapy and Occupational Therapy consult along with Case Manager consult.  4.  Anxiety. Will continue with  his outpatient medications.  5.  Chronic renal insufficiency. The patient's creatinine actually has improved since last hospitalization. Will continue to monitor.  6.  Leukocytosis. I suspect this is likely secondary to stress reaction from his fall. He has no signs of infection at this time. Will check a urinalysis to rule out a UTI although he is asymptomatic, as well as continue to monitor the CBC.  7.  History of atrial fibrillation. Will continue patient on Coumadin. The patient will be admitted to telemetry.  8.  The patient is a FULL CODE status.   Time spent:  Approximately 40 minutes.     ____________________________ Donell Beers. Benjie Karvonen, MD spm:es D: 09/10/2012 13:41:41 ET T: 09/10/2012 14:11:52 ET JOB#: [id number]  cc: Zanya Lindo P. Benjie Karvonen, MD, <Dictator> [facility] P Oza Oberle MD ELECTRONICALLY SIGNED 09/10/2012 16:24

## 2015-01-12 NOTE — Consult Note (Signed)
Brief Consult Note: Diagnosis: pt with cardiomyopathy/aicd/chf who was admitted after falling over his dog with left humeral fracture.   Patient was seen by consultant.   Recommend further assessment or treatment.   Comments: 74 yo male with history of cardiomyopathy with ef less than 30% who has an aicd in place. Apparently has been noting increaasing shortness of breath and associated anxiety. He was scheduled to seee Sr. Nehemiah Massed today but last pm was knocked over by his dog causing a left hujmoral fradture.He is now admitted with left arm pain secondary to the fracture and shortness of breath. ALso has extreme anxiety isues which have been present for some time and has been treated with anxiolytics. Will need carful diuresis. Would ocntinue with carvedilol, after load reduction and careful diuresis. Mild troponin elevation does not appear to be secondary to an acute coronary event but likely secondary to chf.  FUll note to follow..  Electronic Signatures: Teodoro Spray (MD)  (Signed 17-Dec-13 23:43)  Authored: Brief Consult Note   Last Updated: 17-Dec-13 23:43 by Teodoro Spray (MD)

## 2015-01-13 ENCOUNTER — Other Ambulatory Visit: Payer: Self-pay | Admitting: *Deleted

## 2015-01-13 NOTE — Telephone Encounter (Signed)
12/17/14 

## 2015-01-14 ENCOUNTER — Other Ambulatory Visit: Payer: Self-pay | Admitting: Internal Medicine

## 2015-01-14 MED ORDER — ALPRAZOLAM 0.5 MG PO TABS: 0.5000 mg | ORAL_TABLET | Freq: Three times a day (TID) | ORAL | Status: DC | PRN

## 2015-01-14 NOTE — Telephone Encounter (Signed)
Approved: #[id number] x 0 

## 2015-01-14 NOTE — Telephone Encounter (Signed)
rx called into pharmacy

## 2015-01-15 ENCOUNTER — Encounter: Payer: Self-pay | Admitting: Internal Medicine

## 2015-01-15 NOTE — Discharge Summary (Signed)
PATIENT NAME:  Micheal [name], Micheal [name] MR#:  [id number] DATE OF BIRTH:  Dec 28, 1940  DATE OF ADMISSION:  09/10/2012 DATE OF DISCHARGE:  09/19/2012  ADMITTING PHYSICIAN: Bettey Costa, MD  DISCHARGING PHYSICIAN: Gladstone Lighter, MD  PRIMARY CARE PHYSICIAN: [facility]   CONSULTANTS: 1. Bartholome Bill, MD - Cardiology.  2. Anthonette Legato, MD - Nephrology.  DISCHARGE DIAGNOSES: 1. Acute renal failure.  2. Chronic kidney disease stage III, baseline creatinine of 1.4.  3. Hypertension.  4. Hyperlipidemia.  5. Chronic atrial fibrillation on Coumadin.  6. Cardiomyopathy with ejection fraction of less than 25%.  7. Status post automatic implantable cardiac defibrillator and pacemaker.  8. Dementia with intermittent confusion.  9. Left humerus fracture with nonunion.  10. Chronic low back pain.  11. Acute on chronic systolic congestive heart failure with ejection fraction less than 25%.  12. Elevated troponin secondary to congestive heart failure.   DISCHARGE HOME MEDICATIONS: 1. Pravastatin 80 mg p.o. daily at bedtime.  2. Celexa 40 mg p.o. daily.  3. Uloric 40 mg p.o. daily.  4. Coreg 3.125 mg p.o. twice a day. 5. Coumadin 5 mg p.o. Monday through Friday and then 2.5 mg on Saturday and Sunday.  6. Potassium chloride 10 mEq p.o. daily.  7. Lisinopril 2.5 mg p.o. daily.  8. Klonopin 1 mg 3 times a day.  9. Norco 5/325 mg every 6 hours as needed for pain.  10. Hydroxyzine 50 mg p.o. 3 times daily as needed for itching. 11. Lasix 20 mg p.o. daily.  12. Flexeril 5 mg twice a day as needed for back pain.  13. Protonix 40 mg p.o. daily.  14. Senna Colace 50/8.6 mg 2 tablets twice a day for constipation.   DISCHARGE DIET: Low-sodium diet.   DISCHARGE OXYGEN: 1 liter.   DISCHARGE ACTIVITY: As tolerated.   FOLLOWUP INSTRUCTIONS: 1. Primary care physician follow-up in 2 weeks.  2. Nephrology follow-up in 1 to 2 weeks.  3. Orthopedic follow-up of left humerus fracture in 3 to 4 weeks.   4. Physical therapy.   LABS AND IMAGING STUDIES: Prior to discharge, sodium is 140, potassium 4.4, chloride 107, bicarbonate 26, BUN 41, creatinine 1.4, glucose 125 and calcium 9.2. INR is 2.0 at the time of discharge. WBC 9.2, hemoglobin 11.4, hematocrit 35.1 and platelet count 266. LDL 59, HDL 31, total cholesterol 106 and triglycerides 81. Hemoglobin A1c 6.0. Plasma ammonia is less than 25. B12 is elevated at 1272. TSH is within normal limits at 0.93.  Chest x-ray on 09/18/2012 is showing no acute disease of the chest. Cardiac pacer is present. No focal parenchymal opacity, effusion or pneumothorax. Mild bilateral interstitial thickening without overt congestive heart failure is present at this time.   Ultrasound of kidneys bilaterally is showing increased echogenicity consistent with medical renal disease. No obstructive uropathy is present.   BRIEF HOSPITAL COURSE: Micheal [name] is a 74 year old male with past medical history significant for congestive heart failure with ejection fraction of less than 25%, history of atrial fibrillation status post AICD pacemaker and chronic kidney disease stage III and follows as an outpatient with nephrology who was brought in secondary to generalized weakness and fall. He also had sustained a left humerus fracture 2 to 3 weeks prior to presentation and was also dyspneic on admission.  1. Acute on chronic systolic congestive heart failure on admission with elevated troponins. He was initially admitted to 2A and diuresed with Lasix 40 IV q. 8 hours. He was over diuresed and that caused him  to have acute renal failure. His Lasix was held for the rest of the hospital stay and he was gently hydrated at 40 mL/hour 0.5 normal saline. His last chest x-ray does not show any congestive heart failure and he is being discharged on low dose Lasix at 20 mg daily and he is also on the other heart failure medications which include lisinopril, Coreg and statin.  2. Chronic atrial  fibrillation. He is status post pacemaker and AICD. He is on Coumadin. INR was elevated, dose adjusted, his INR at the time of discharge is 2.0 and he is being discharged back on Coumadin.  3. Chronic low back pain and also left humerus fracture, chronic pain issues. He was on Flexeril at home as needed and with also Vicodin. He is being discharged on Narco and Flexeril p.r.n. He did work with physical therapy and was extremely weak and they recommended rehab. The patient initially was refusing to go to rehab, but after discussion with his daughters and also him he finally agreed to go to short term rehab at this time.  4. Acute on chronic renal failure. His creatinine was elevated as high as 3.4, but his baseline was 1.4, likely secondary to ATN from over diuresis. He was seen by nephrology in the hospital and after gentle IV hydration and holding nephrotoxic agents including Lasix his creatinine is back to baseline at 1.4. He still has a little bit of edema in his lower extremities which tends to be chronic. His Lasix dose is being halved at the time of discharge to 20 mg daily and he will follow up with nephrology as an outpatient. His course has been otherwise uneventful in the hospital.   DISCHARGE CONDITION: Stable.   DISCHARGE DISPOSITION: To short-term rehab.   TIME SPENT ON DISCHARGE: 45 minutes. ____________________________ Gladstone Lighter, MD rk:sb D: 09/19/2012 11:48:50 ET    T: 09/19/2012 12:29:53 ET       JOB#: [id number] cc: Gladstone Lighter, MD, <Dictator> [facility] Lilian Kapur, MD Gladstone Lighter MD ELECTRONICALLY SIGNED 10/04/2012 14:11

## 2015-01-15 NOTE — H&P (Signed)
PATIENT NAME:  Micheal [name], Micheal [name] MR#:  [id number] DATE OF BIRTH:  Nov 07, 1940  DATE OF ADMISSION:  04/15/2013  PRIMARY CARE PROVIDER: Venia Carbon, MD, and [facility]; the patient goes to both.   EMERGENCY DEPARTMENT REFERRING PHYSICIAN: Verdia Kuba. Paduchowski, MD   CHIEF COMPLAINT: Shortness of breath.   HISTORY OF PRESENT ILLNESS: The patient is a 74 year old Caucasian male with a history of systolic CHF with cardiomyopathy, who presents with complaint of having progressive weight gain over the past few weeks. The patient reports approximately 30-pound weight gain over the past few weeks. He also started having worsening lower extremity swelling as well as progressive shortness of breath. He reports that he is chronically short of breath and has chronic O2 at 2 liters at all times, but now his shortness of breath is worse over the past few days. The patient has limited mobility, and he uses walker sometimes to walk. At nighttime, he sleeps in a recliner, so he is not aware of having any dyspnea at nighttime. The patient also denies any chest pain, palpitations or syncope. He does complain of his nose running and feeling of stuffiness in his nose recently. Associated with that, he had some cough, but he has not had any fevers. He denies any abdominal pain, nausea, vomiting or diarrhea.   PAST MEDICAL HISTORY:  1. History of chronic kidney disease stage III.  2. Hypertension.  3. Hyperlipidemia.  4. Chronic atrial fibrillation.  5. History of cardiomyopathy with previous EF of 25%; however, he had a recent echo at [facility] which was noted to have EF worsened at 10%, according to the daughter.  6. Mild dementia.  7. Status post defibrillator and a pacemaker.  8. History of left humerus fracture, treated nonsurgically.  9. Chronic low back pain.  10. Gout. 11. Depression.  PAST SURGICAL HISTORY: Only surgical history, according to him, is defibrillator and a pacemaker.    ALLERGIES: None.   FAMILY HISTORY: Positive for congestive heart failure, CVA.   SOCIAL HISTORY: Used to smoke, quit 18 years ago. No alcohol or drug use. Lives by himself, but has a daughter that visits him at least 3 times a day.   REVIEW OF SYSTEMS:  CONSTITUTIONAL: Denies any fevers. Complains of fatigue and weakness. No significant pain. Denies any weight loss. Complains of weight gain as above.  EYES: No blurred or double vision. No pain. No redness. No inflammation. No glaucoma. No cataracts.  ENT: No tinnitus. No ear pain. No hearing loss. No seasonal or year-round allergies. No epistaxis. No nasal discharge. No difficulty swallowing.  RESPIRATORY: Complains of some cough, but no wheezing. No hemoptysis. Complains of dyspnea. No COPD.  CARDIOVASCULAR: Denies any chest pain or orthopnea. Complains of significant edema. Has no history of arrhythmia. Complains of dyspnea on exertion. No palpitations. No syncope.  GASTROINTESTINAL: No nausea, vomiting, diarrhea. No abdominal pain. No hematemesis. No melena. No ulcer. No GERD. No IBS. No jaundice. No rectal bleeding. No constipation.  GENITOURINARY: Denies any dysuria, hematuria, renal calculus or frequency.  ENDOCRINE: Denies any polyuria, nocturia or thyroid problems.  HEMATOLOGIC AND LYMPHATIC: Denies any major bruisability or bleeding.  SKIN: He does have chronic petechiae in his lower extremities; however, according to his daughter, this is worse over the past 2 weeks. Also, he has a left-sided facial bruise from a fall that he sustained about a month ago.  MUSCULOSKELETAL: Has pain related to osteoarthritis, has history of gout.  NEUROLOGIC: No numbness. No CVA.  No TIA. No seizures.  PSYCHIATRIC: Has anxiety and some depression.   PHYSICAL EXAMINATION:  VITAL SIGNS: Temperature 98, pulse 75, respirations 22, blood pressure 150/89, O2 91% on 2 liters.  GENERAL: The patient is an obese Caucasian male who appears chronically ill.   HEENT: Head atraumatic, normocephalic. Pupils equally round and reactive to light and accommodation. There is no conjunctival pallor. No scleral icterus. Nasal exam shows no drainage or ulceration. Oropharynx is clear, without any exudate.  NECK: Supple. No JVD. No carotid bruits.  CARDIOVASCULAR: Regular rate and rhythm. No murmurs, gallops, clicks or heaves. PMI is not displaced.  LUNGS: He has got bilateral crackles at the bases. There is no wheezing or rhonchi.  ABDOMEN: Soft, nontender, nondistended. Positive bowel sounds x4. No hepatosplenomegaly.  EXTREMITIES: Bilateral 2+ edema. He has got bilateral petechiae.  SKIN: Petechiae as above. Left-sided facial bruising. LYMPHATICS: No lymph nodes palpable.  VASCULAR: Good DP, PT pulses.  PSYCHIATRIC: Not anxious or depressed. NEUROLOGIC: Awake, alert, oriented x3. He has a slight left-sided facial droop.  MUSCULOSKELETAL: He has limited range of movement in the left upper extremity.  GENITOURINARY: Deferred.  LABORATORY EVALUATIONS: Glucose 115, BUN 21, creatinine 1.82, sodium 137, potassium 4.8, chloride 99, CO2 is 38, calcium is 9.0. LFTs showed albumin of 3.2. CPK 59, CK-MB 1.5, troponin 0.04. WBC 6.3, hemoglobin 13.5, platelet count 173. EKG shows ventricular paced rhythm. Chest x-ray shows findings consistent with CHF.   ASSESSMENT AND PLAN: The patient is a 74 year old with severe cardiomyopathy, with worsening ejection fraction. Presents with shortness of breath, weight gain.   1. Acute on chronic respiratory failure due to acute systolic congestive heart failure. 2. Acute on chronic systolic congestive heart failure with worsening echocardiogram. At this time, will treat him with IV Lasix. Will continue Coreg and lisinopril. I will ask his cardiologist to re-evaluate. Since he had an echocardiogram last month, I will not repeat.  3. Chronic kidney disease. The patient will be on IV Lasix. He has a history of acute renal failure during  his previous hospitalizations, so I will ask nephrology to come evaluate the patient.  4. Hyperlipidemia. Continue pravastatin as taking it previously.  5. Atrial fibrillation, on Coumadin. INR is currently not available. The patient had a fall last month, has some bruising on his face. He has got also petechiae on the lower extremities. Anticoagulation needs to be reconsidered by his primary cardiologist, and this needs to be decided if it should be continued or not.  6. Gout. We will continue Uloric.  7. Miscellaneous. The patient on Coumadin for deep vein thrombosis prophylaxis.   TIME SPENT: Note, 45 minutes spent.  ____________________________ Lafonda Mosses. Posey Pronto, MD shp:OSi D: 04/15/2013 12:41:31 ET T: 04/15/2013 12:49:50 ET JOB#: [id number]  cc: Ziyon Cedotal H. Posey Pronto, MD, <Dictator> Alric Seton MD ELECTRONICALLY SIGNED 04/28/2013 9:00

## 2015-01-15 NOTE — Consult Note (Signed)
PATIENT NAME:  Micheal, [name] MR#:  [id number] DATE OF BIRTH:  07-Oct-1940  DATE OF CONSULTATION:  09/12/2012  REFERRING PHYSICIAN:   CONSULTING PHYSICIAN:  Dawayne Patricia, MD  NOTE: A brief consultation note is available in the chart from the time of consultation.   CHIEF COMPLAINT: Left humeral fracture.   HISTORY OF PRESENT ILLNESS: Micheal [name] is a 74 year old gentleman who was evaluated with both of his daughters present. He states that he had sustained a fall the day prior to admission to the hospital. He complains of significant pain in the left shoulder as well as the left lateral thigh. The patient does have a history of a proximal humerus fracture, on the same side, from 2007. He states that he was unable to undergo surgical treatment at that time due to his significant cardiac problems.   PHYSICAL EXAMINATION: The left shoulder was examined. The patient has a small area of ecchymosis laterally, at the deltoid insertion. There is some mild swelling in this area. The patient has significant limitation in motion. Any attempt at motion is somewhat uncomfortable to the patient. He has no palpable deformity, at the proximal humerus. He does have obvious muscle wasting of both the supra and infraspinatus. He has 5 out of 5 biceps and triceps strength. He has good intrinsic hand strength. His sensation is grossly intact in the axillary, median, radial and ulnar nerve distributions. He does have a 2+ radial artery pulse.   Left hip was also examined. The patient has 5 out of 5 hip flexor, extensor, abductors and adductor strength. He has no limitation in motion of the hip. He is no pain with motion in the hip. He does have some swelling in the lateral aspect of the proximal thigh. This is soft and compressible. There are no lesions or abrasions noted. There is no ecchymosis noted. The patient is neurovascularly intact, on the lower extremity.   IMAGING STUDIES: Radiographs of the left proximal  humerus were reviewed. They demonstrate a nonunion of a surgical neck fracture with some arthritic changes and a high riding humeral head suggestive of a prior rotator cuff tear. Comparison to prior radiographs demonstrate that this fracture occurred in 2007. Left hip films demonstrate no evidence of fracture or dislocation.    ASSESSMENT:  1. 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  2. Soft tissue contusion, left lateral proximal thigh.   PLAN: I spent 55 minutes with Micheal [name] and his daughters discussing his shoulder and possibilities for him. The patient is quite used to the lack of function that he has had as this has been ongoing since 2007. He does still have significant pain. I did explain that this could be due to an exacerbation of arthritis. However, I do not see any acute fracture pattern. Certainly there are limited surgical options for this patient. He may benefit from consultation with a shoulder specialist to discuss whether he would be a candidate for some form of total shoulder replacement versus reverse total shoulder replacement. I did advise him that with the nonunion and the location of the fracture at the level of the surgical neck that this may not be possible as there may not be enough bone stock to support implant of a prosthesis. He may also be a candidate for a fusion across the joint for pain control. However, again, this would assume that he was felt to be stable enough for surgery and a good candidate for  healing across a fusion site. They were provided with the names of four shoulder surgeons within 45 minutes of [place]. They will contact these surgeons independently upon discharge. Thank you for this consultation.  ____________________________ Dawayne Patricia, MD sr:sb D: 09/12/2012 08:34:51 ET T: 09/12/2012 09:50:25 ET JOB#: [id number]  cc: Dawayne Patricia, MD, <Dictator> Dawayne Patricia  MD ELECTRONICALLY SIGNED 09/26/2012 21:23

## 2015-01-15 NOTE — Consult Note (Signed)
Brief Consult Note: Diagnosis: acute on chronic systollic heart failure with ef of 10% admitted with weight gain of 10-15 pounds and peripheral edema.   Patient was seen by consultant.   Recommend further assessment or treatment.   Comments: 74 yo with dilated cardiomypoathy with ef of 10-15%, history of aicd placement admitted with progressive shortness of breath, signficnat weight gain and worsening peripheral edema. He has increased his po lastix intake but has been unable to diureses. well. Will attempt to diureses with iv lasix and follow results and renal funciton. Agree with nephrology assistance with titration of meds. Low sodium diet. Further recs and full consult pending course.  Electronic Signatures: Teodoro Spray (MD)  (Signed 22-Jul-14 22:34)  Authored: Brief Consult Note   Last Updated: 22-Jul-14 22:34 by Teodoro Spray (MD)

## 2015-01-15 NOTE — Discharge Summary (Signed)
PATIENT NAME:  Micheal [name], Micheal [name] MR#:  [id number] DATE OF BIRTH:  Nov 25, 1940  DATE OF ADMISSION:  04/15/2013 DATE OF DISCHARGE:  04/18/2013  PRIMARY CARE PHYSICIAN: Nonlocal.   DISCHARGE DIAGNOSES: 1.  Acute systolic congestive heart failure with ejection fraction 20%.  2.  Acute renal failure with chronic kidney disease. 3.  Atrial fibrillation.   CONDITION: Stable.   CODE STATUS: FULL CODE.   HOME MEDICATIONS: 1.  Uloric 40 mg p.o. 1 tablet once a day. 2.  Coreg 3.125 mg p.o. b.i.d.  3.  Potassium chloride 10 mEq 1 tab once a day. 4.  Docusate/senna 50 mg/8.6 mg p.o. tablets 2 tablets every 12 hours p.r.n.  5.  Cyclobenzaprine 5 mg p.o. b.i.d. p.r.n.  6.  Citalopram 40 mg p.o. once a day.  7.  Coumadin 2.5 mg tablets 1 tablet every other day at lunchtime alternating with Warfarin.  8.  Coumadin 5 mg p.o. 1 tab every other day at lunchtime alternating. 9.  Lasix 40 mg p.o. once a day. May take additional dose as needed.  10.  Hydroxyzine hydrochloride 50 mg p.o. tablets b.i.d. 11.  Lisinopril 5 mg p.o. once a day. 12.  Pravastatin 80 mg p.o. at bedtime. 13.  Alprazolam 0.5 mg 1 tablet t.i.d.  HOME OXYGEN:  2 liters by nasal cannula.  DIET: Low sodium, low fat, low cholesterol.  ACTIVITY: As tolerated.   FOLLOW-UP CARE: Follow up with PCP within 1 to 2 weeks. Follow up with Dr. Holley Raring within 1 week. In addition, the patient needs followup with Dr. Ubaldo Glassing within 1 to 2 weeks.  PRIMARY CARE PHYSICIAN: Nonlocal.   CONSULTATIONS: Cardiology, Dr. Ubaldo Glassing; nephrology, Dr. Holley Raring.   REASON FOR ADMISSION: Shortness of breath.   HOSPITAL COURSE:  1.  The patient is a 74 year old Caucasian male with a history of CKD, hypertension, A-fib, and CHF with cardiomyopathy and ejection fraction 25% who came to the ED due to shortness of breath and worsening lower extremity edema for a few days. The patient had some cough, but has no fever.  For detailed history and physical examination, please  refer to the admission note dictated by Dr. Dustin Flock.  Laboratory data on admission date showed glucose 115, BUN 21, creatinine 1.82, sodium 137, potassium 4.8 and chloride 99. Troponin was 0.04. Chest x-ray showed CHF findings. The patient was admitted for acute on chronic respiratory failure due to CHF.  After admission, the patient has been treated with Lasix 40 mg IV b.i.d. In addition, the patient has been treated with Coreg and lisinopril. The patient's symptoms have much improved after the above-mentioned treatment, but still has leg edema.  2.  Acute renal failure on CKD.  The patient's BUN increased to 31 and creatinine increased to 2 yesterday, but today decreased to 1.92.   Dr. Holley Raring suggested continue Lasix but changed to p.o. 40 mg b.i.d. In addition, Dr. Holley Raring suggested albumin treatment. He suggested the patient can follow up with him as outpatient and monitor renal function.  3.  For A-fib, the patient has been on Coumadin.  The patient has limited mobility.  He used a walker to walk at home. The patient needs home health, but the patient and the patient's daughter refused home health, per case manager. The patient symptoms have much improved. Vital signs are stable. He is going to be discharged to home today. I discussed the patient's discharge plan with the patient, the patient's daughter, nurse, case manager and Dr. Holley Raring.   TIME SPENT:  About 38 minutes. ____________________________ [name], MD qc:sb D: 04/18/2013 13:20:16 ET T: 04/18/2013 14:51:12 ET JOB#: [id number]  cc: [name], MD, <Dictator> [name] MD ELECTRONICALLY SIGNED 04/22/2013 11:08

## 2015-01-16 NOTE — H&P (Signed)
PATIENT NAME:  Micheal [name], Micheal [name] MR#:  [id number] DATE OF BIRTH:  1941-07-09  DATE OF ADMISSION:  09/06/2014  PRIMARY CARE PHYSICIAN: Venia Carbon, MD  NEPHROLOGIST: Tama High, MD  CARDIOLOGIST: Corey Skains, MD  CHIEF COMPLAINT: Not urinating a lot and it is burning.   HISTORY OF PRESENT ILLNESS: A 74 year old man with history of chronic kidney disease and congestive heart failure with a low EF of 20%. He presents with not urinating a lot. It has been burning on urination. He had an urgency to go, only urinating small amounts at a time. Yesterday, he did not eat or drink very well, had some low back pain. He had some nausea. Came to the ER for further evaluation. In the ER, he was found to be in acute on chronic renal failure with a creatinine of 4.84 and a BUN of 132, elevated white count at 23.2. Positive urinalysis. Hospitalist services were contacted for further evaluation.   PAST MEDICAL HISTORY: Chronic kidney disease, stage III, anxiety, congestive heart failure systolic in nature with an EF of less than 20%, hypertension, TIA, hyperlipidemia, atrial fibrillation, gout, low back pain.   PAST SURGICAL HISTORY: Defibrillator placement x2.   ALLERGIES: No known drug allergies.   MEDICATIONS: Include acetaminophen/hydrocodone 10/325 one tablet every 6 hours as needed for pain, Xanax 0.5 mg 3 times a day as needed, calcitriol 0.25 mg daily, Coreg 3.125 mg twice a day, Celexa 40 mg daily, cyclobenzaprine 5 mg twice a day as needed for muscle spasms, Uloric 80 mg daily, furosemide 40 mg 2 tablets 5 days a week, hydroxyzine 50 mg 3 times a day as needed, lisinopril 5 mg daily, metolazone 5 mg twice a day, MiraLax 17 g daily, potassium chloride 10 mEq daily, pravastatin 80 mg daily, warfarin 5 mg every day except for Tuesday and Thursday where he takes 2.5 mg.   SOCIAL HISTORY: No smoking. No alcohol. No drug use. Used to work as a [employer].   FAMILY HISTORY: Father died of  heart disease. Mother died of heart failure.  REVIEW OF SYSTEMS:  CONSTITUTIONAL: Positive for weakness. No fever, chills, or sweats.  EYES: Does wear glasses. EARS, NOSE, MOUTH, AND THROAT: Positive for runny nose. No sore throat. No difficulty swallowing.  CARDIOVASCULAR: No chest pain. No palpitation.  RESPIRATORY: Positive for shortness breath. No cough. No sputum. No hemoptysis.  GASTROINTESTINAL: Positive for nausea. No vomiting. No abdominal pain. No diarrhea. No constipation. No bright red blood per rectum. No melena.  GENITOURINARY: Positive for burning on urination, decreased urination. Small amounts at a time, urgency to go positive for blood in the urine.  MUSCULOSKELETAL: No joint pain or muscle pain.  INTEGUMENT: No rashes or eruptions.  NEUROLOGICAL: No fainting or blackouts.  PSYCHIATRIC: Positive for anxiety.  ENDOCRINE: No thyroid problems. HEMATOLOGIC AND LYMPHATIC: No anemia.   PHYSICAL EXAMINATION: VITAL SIGNS: Respirations 18, blood pressure 105/64, pulse oximetry 100% on room air, temperature 97.7.  GENERAL: No respiratory distress.  EYES: Conjunctivae and lids normal. Pupils equal, round, and reactive to light. Extraocular muscles intact. No nystagmus. EARS, NOSE, MOUTH, AND THROAT: Tympanic membranes: No erythema. Nasal mucosa: No erythema. Throat: No erythema. No exudate seen. Lips and gums: No lesions.  NECK: No JVD. No bruits. No lymphadenopathy. No thyromegaly. No thyroid nodules palpated.  LUNGS: Clear to auscultation. No use of accessory muscles to breathe. No rhonchi, rales, or wheeze heard.  CARDIOVASCULAR SYSTEM: S1, S2 normal. No gallops, rubs, or murmurs heard. Carotid upstroke 2+  bilaterally. No bruises. Dorsalis pedis pulses 2+ bilaterally. Trace edema of the lower extremity.  ABDOMEN: Soft, nontender. No organomegaly/splenomegaly. Normoactive bowel sounds. No masses felt. No CVA tenderness. LYMPHATIC: No lymph nodes in the neck.  MUSCULOSKELETAL: No  clubbing. Trace edema. No cyanosis.  SKIN: Chronic lower extremity discoloration in bilateral lower extremities.  NEUROLOGIC: Cranial nerves II through XII grossly intact. Deep tendon reflexes 2+ bilateral lower extremities.  PSYCHIATRIC: The patient is oriented to person, place, and time.   LABORATORY AND RADIOLOGICAL DATA: INR 2.4. Glucose 118, BUN 132, creatinine 4.84, sodium 131, potassium 3.3, chloride 91, CO2 of 30, calcium 9.5. Liver function tests normal range. White blood cell count 23.2, hemoglobin and hematocrit 13.3 and 41.8, platelet count of 151,000. Urinalysis 3+ leukocyte esterase, positive for nitrites, 2+ blood, 2+ bacteria.   ASSESSMENT AND PLAN: 1.  Acute renal failure on chronic kidney disease, likely dehydration. Bladder scan was done in the ER only 44 mL in the bladder. We will give gentle IV fluids since the patient does have a history of systolic congestive heart failure with decompressed ejection fraction less than 20%. I will get a renal ultrasound. Strict inputs and outputs. Start Flomax. Check serial BMPs. I will get a renal ultrasound. Nephrology consultation. Does see Dr. Holley Raring as outpatient. I consulted Dr. Juleen China on call today.  2.  Cystitis with hematuria with leukocytosis. We will give Rocephin IV. Check a urine culture.  3.  Hyponatremia. I will give gentle IV fluid hydration with normal saline and continue to monitor, holding metolazone and Lasix at this time.  4.  Hypokalemia. We will give oral potassium supplementation.  5.  Atrial fibrillation on Coumadin, therapeutic. Continue Coreg for rate control.  6.  History of systolic congestive heart failure with ejection fraction of 20%. No signs of congestive heart failure right now. Watch closely with gentle IV fluid hydration.  7.  Anxiety. Continue usual medications.  8.  Hyperlipidemia, on pravastatin.  9.  Gout.  Time Spent on admission: 55 minutes.  CODE STATUS: The patient is a full  code.   ____________________________ Tana Conch. Leslye Peer, MD rjw:sw D: 09/06/2014 14:08:38 ET T: 09/06/2014 14:20:19 ET JOB#: [id number]  cc: Tana Conch. Leslye Peer, MD, <Dictator> Venia Carbon, MD [place] Lilian Kapur, MD Corey Skains, MD   Marisue Brooklyn MD ELECTRONICALLY SIGNED 09/16/2014 15:49

## 2015-01-17 NOTE — H&P (Signed)
PATIENT NAME:  Micheal [name], Micheal [name] MR#:  [id number] DATE OF BIRTH:  [birth year]  DATE OF ADMISSION:  01/15/2012  PRIMARY CARE PHYSICIAN: [facility]  ER PHYSICIAN: Dr. Lenise Arena  CHIEF COMPLAINT: Fall.    HISTORY OF PRESENT ILLNESS: Patient is a 74 year old male with history of hypertension, hyperlipidemia, had a fall this morning. Patient was getting out of the bed and felt very weak in the legs and had a fall and called the daughter. Patient did not have any loss of consciousness and did not have chest pain and did not feel dizzy and he felt weak and fell down. According to the patient's daughter patient having confusion for last 3 to 4 days and this morning when he fell he called the daughter and asked her to come. She noticed that he was having just weakness and his legs felt like jelly and he fell down and he could not stand and walk. Patient's blood pressure was also low in the ER, around 97/62 and I was asked to admit for urinary tract infection. Patient denies any symptoms of impaired speech or visual problems and patient denies chest pain. No dizziness. No loss of consciousness. No fever or chills. Confusion is noted by the family. Denies any dysuria or trouble urinating or burn. Denies any frequency of urination.   PAST MEDICAL HISTORY: 1. History of atrial fibrillation. 2. History of ischemic cardiomyopathy, ejection fraction of around 30%, status post pacemaker and defibrillator.  3. Hypertension. 4. Anxiety. 5. Vitamin D deficiency. 6. Hyperlipidemia. 7. Depression. 8. Gout. 9. Stage III chronic kidney disease. Follows up with Dr. Holley Raring for that.  10. History of chronic back pain.   PAST SURGICAL HISTORY: Pacemaker and defibrillator placement and he had revision pacemaker this February, on Valentine's Day.    SOCIAL HISTORY: He was a smoker, quit 15 years ago. Occasional alcohol. Lives alone and has two daughters live nearby.    FAMILY HISTORY: Mother has congestive heart  failure. Father had MI.   MEDICATIONS:  1. Klonopin 2 mg b.i.d.  2. Hydroxyzine 25 mg 1 pill in the morning, 1 to 2 pills at night. 3. Pravastatin 80 mg at night.  4. Potassium 20 mEq p.o. 1 pill in the morning and 1 pill at night.  5. Lisinopril 40 mg tablet takes 1/4 pill in the morning, that is 10 mg. 6. Coreg 12.5 mg p.o. b.i.d.  7. Lasix 80 mg in the morning.  8. Allopurinol is stopped. 9. Celexa 40 mg in the morning. 10. Cyclobenzaprine 10 mg as needed.  11. Percocet 10/325, 1 pill in the morning as needed. 12. Coumadin 5 mg at night as per INR.  13. Fentanyl 25 mcg patch every three days.  14. Nitroglycerin as needed.  15. Uloric 40 mg in the morning.   ALLERGIES: No known allergies.   REVIEW OF SYSTEMS: CONSTITUTIONAL: Patient has no fever. Has generalized weakness. EYES: No blurred vision. ENT: No tinnitus. No epistaxis. No difficulty swallowing. RESPIRATORY: No cough. No wheezing. CARDIOVASCULAR: No chest pain. Patient has no orthopnea. Trace pedal edema. No palpitations. GASTROINTESTINAL: No nausea. No vomiting. No abdominal pain. GENITOURINARY: No dysuria. ENDOCRINE: No polyuria, nocturia. INTEGUMENTARY: No skin rashes. MUSCULOSKELETAL: Complains of ankle pain and knee pain because of a fall this morning and also chronic back pain present. Patient has history of gout, also generalized weakness. NEUROLOGIC: No numbness or weakness. No dysarthria. No history of transient ischemic attack. PSYCH: Patient has history of anxiety and depression.   PHYSICAL EXAMINATION: VITAL  SIGNS: Temperature 97.9, pulse 75, respirations 18, blood pressure 97/62, saturation 99% on room air.   GENERAL: He is alert, awake, oriented x4.   HEENT: Head atraumatic, normocephalic. Pupils are equally reacting to light. Extraocular movements are intact. ENT: No tympanic membrane congestion. No turbinate hypertrophy. No oropharyngeal erythema.   NECK: Normal range of motion. No JVD. No carotid bruit.    CARDIOVASCULAR: S1, S2 regular. No murmurs. Patient has defibrillator present in left anterior chest wall.   LUNGS: Bilaterally clear to auscultation. No wheeze. No rales.   ABDOMEN: Soft, nontender, nondistended. Bowel sounds present.   EXTREMITIES: Patient has 1+ edema bilaterally up to the ankles. There is small abrasion on the left knee on the anterior side but range of motion is normal within both extremities and slight tenderness to palpation on the left ankle on the medial side.   NEUROLOGIC: Patient is alert, awake, oriented x3. Cranial nerves II through XII intact. Power 5/5 upper and lower extremities. Sensation is intact. Deep tendon reflexes 2+ bilaterally. Patient's speech is clear.    PSYCH: Mood and affect are within normal limits.   LABORATORY, DIAGNOSTIC AND RADIOLOGICAL DATA: Urine showed yellow cloudy urine with 2+ blood and 3+ leukocyte esterase and nitrites and bacteria 3+ and WBC 369. CBC showed WBC 8.3, hemoglobin 11.3, hematocrit 36.7, platelets 175,000. Electrolytes: Sodium 141, potassium 5.3, chloride 107, bicarbonate 28, BUN 35, creatinine 2.53 and glucose 98. Liver functions are within normal limits. INR 2.2. CT of the head showed involutional changes without any acute evidence of focal abnormality. Creatinine and BUN on 02/18/2011 was BUN 34, creatinine 2.04. EKG shows 76 beats per minute with electronic ventricular pacemaker.   ASSESSMENT AND PLAN: This is a 74 year old male with altered mental status due to urinary tract infection. Patient also has hypotension and hyperkalemia. 1. Urinary tract infection. He already received a dose of Rocephin. Follow urine cultures and continue Rocephin.  2. Altered mental status likely due to urinary tract infection. CT head is negative. Patient's mental status is clear. Continue to monitor closely.  3. Slight hyperkalemia. Patient taking potassium supplements. We will hold that and recheck potassium tomorrow.  4. Hypotension  due to combination of medications and urinary tract infection. Patient already received a liter of fluids. Because of his poor ejection fraction will not give any more IV fluids. Patient's is requesting Lasix but we will recheck the blood pressure. If it improves we will give 40 mg of Lasix instead of 80 mg from tomorrow morning and continue to hold his Coreg and lisinopril because of his low blood pressure at this time. If blood pressure improves we can restart the Coreg 12.5 mg p.o. b.i.d.  5. Chronic back pain. Continue fentanyl patch and Percocet.  6. Patient has history of atrial fibrillation. INR is therapeutic. Continue Coumadin 5 mg at bedtime.  7. Generalized weakness with falls. Patient needs to be seen by physical therapist.  8. Chronic kidney disease with acute on chronic renal failure, stage III chronic kidney disease. Slight worsening in kidney function. Already received a liter of fluid and also we are holding Lasix and lisinopril. Check renal function in the morning.  9. History of gout, which is chronic with no acute flare at this time. Continue Uloric.  10. Condition discussed with the patient's family, that is patient's daughter, in detail.  TIME SPENT ON HISTORY AND PHYSICAL: About 60 minutes.   ____________________________ Epifanio Lesches, MD sk:cms D: 01/15/2012 17:16:19 ET T: 01/16/2012 06:06:39 ET JOB#: [id number]  cc: Epifanio Lesches, MD, <Dictator> [facility] Epifanio Lesches MD ELECTRONICALLY SIGNED 01/20/2012 12:31

## 2015-01-17 NOTE — Discharge Summary (Signed)
PATIENT NAME:  Micheal [name], Micheal [name] MR#:  [id number] DATE OF BIRTH:  12/21/1940  DATE OF ADMISSION:  01/15/2012 DATE OF DISCHARGE:  01/17/2012  PRIMARY CARE PHYSICIAN: Dr. Jimmye Norman at the [facility]:  1. Metabolic encephalopathy.  2. Urinary tract infection.  3. Dehydration, hypotension.  4. Acute renal failure on chronic kidney disease.  5. Gout.  6. Chronic pain and depression.  7. Atrial fibrillation.  8. Weakness.  9. Left ankle sprain.   MEDICATIONS ON DISCHARGE:  1. Pravastatin 80 mg at bedtime.  2. Hydroxyzine every eight hours as needed for anxiety and skin crawling.  3. Celexa 40 mg daily.  4. Fentanyl 25 mcg patch every three days. 5. Nitroglycerin p.r.n. 0.4 mg sublingually.  6. Uloric 40 mg daily. 7. Cyclobenzaprine 10 mg as needed for muscle spasm.  8. Vicodin 1 tablet 3 times daily as needed for pain.  9. Recommend decreasing Klonopin to 1 mg p.o. twice a day instead of 2 mg. 10. Coumadin 5 mg p.o. nightly as your regular scheduled dose with Dr. Jimmye Norman. 11. Coreg decreased to 3.125 mg p.o. twice a day.  12. Lisinopril decreased to 2.5 mg p.o. daily.  13. Lasix 40 mg p.o. daily, new dose.  14. Ceftin 500 mg p.o. twice a day until completion.   DIET: Low sodium diet.   ACTIVITY: Activity as tolerated.   REFERRAL: Home PT. Follow up at the [facility], Dr. Jimmye Norman, one week.   REASON FOR ADMISSION: Patient was admitted 01/15/2012, discharged 01/17/2012. Came in with a fall.   HISTORY OF PRESENT ILLNESS: This is a 74 year old man history of hypertension, hyperlipidemia and fall. He got very weak in the legs and had a fall. No loss of consciousness. As per the family was having confusion last 3 to 4 days. Blood pressure was low in the Emergency Room and found to have a urinary tract infection. Patient was admitted for encephalopathy, urinary tract infection started on Rocephin. For his hypotension he was given IV fluids and his blood pressure  medications, Lasix, Coreg and lisinopril were held.   LABORATORY, DIAGNOSTIC AND RADIOLOGICAL DATA: Laboratory and radiological data during the hospital course included a urine culture that grew out 100,000 gram-negative rods. ID and sensitivity to follow. EKG paced. CT scan of the head showed involutional changes without evidence of focal or acute abnormalities. INR 2.2, troponin 0.04, glucose 98, BUN 35, creatinine 2.53, sodium 141, potassium 5.3, chloride 107, CO2 28, calcium 8.9. Liver function tests normal. White blood cell count 8.3, hemoglobin and hematocrit 11.5 and 36.7, platelet count 175. Urinalysis 3+ bacteria, 3+ leukocyte esterase, 2+ blood. Chest x-ray: Cardiomegaly. No evidence of acute abnormalities. Upon discharge creatinine down to 1.71, potassium 4.0, white count 6.1, hemoglobin 11.3, platelet count 151, INR 2.4. Ankle x-ray showed no acute bony abnormality.   HOSPITAL COURSE PER PROBLEM LIST:  1. For the patient's metabolic encephalopathy this was thought to be secondary to urinary tract infection. This had improved back to baseline as per the family.  2. For the patient's urinary tract infection, patient was given three doses of Rocephin during the hospital course, this is switched over to Ceftin. Patient improved. At the time of discharge final sensitivities on the urine culture is still pending.  3. Dehydration and hypotension. He was given IV fluids during the hospital course. His Lasix, Coreg and lisinopril were held.  4. For his acute renal failure on chronic kidney disease, creatinine upon admission was up at 2.53 and upon discharge  1.71. Patient was given IV fluid hydration. His Lasix, Coreg and lisinopril initially were held.  5. Gout. Patient is on Uloric.  6. Chronic pain and depression, he is on a fentanyl patch, Vicodin, cyclobenzaprine. Recommended decreasing the Klonopin from 2 mg down to 1 mg twice a day.  7. Atrial fibrillation. Patient is therapeutic on Coumadin.  Continue usual dose as outpatient.  8. Weakness. Physical therapy did see the patient. Stated that he was good to go home with home physical therapy. Daughters are also very supportive and will be around to help the patient out since he does live at home.  9. Left ankle sprain. Ankle x-ray negative. Patient does not believe this is gout. He stated he twisted his ankle with the fall.  10. Low dose blood pressure medications restarted as outpatient. Coreg 3.125 mg twice a day, lisinopril 2.5 mg p.o. daily and Lasix 40 mg p.o. daily. Close clinical monitoring of blood pressure as outpatient. Blood pressure upon discharge was 123/78.   ____________________________ Tana Conch. Leslye Peer, MD rjw:cms D: 01/17/2012 16:30:27 ET T: 01/18/2012 11:02:25 ET JOB#: [id number]  cc: Tana Conch. Leslye Peer, MD, <Dictator> [facility], Dr. Rondel Jumbo MD ELECTRONICALLY SIGNED 01/18/2012 17:34

## 2015-01-20 ENCOUNTER — Encounter: Payer: Self-pay | Admitting: Internal Medicine

## 2015-01-20 LAB — PROTIME-INR

## 2015-01-20 NOTE — Discharge Summary (Signed)
PATIENT NAME:  SARTAJ, HOSKIN MR#:  [id number] DATE OF BIRTH:  1941/06/26  DATE OF ADMISSION:  09/06/2014 DATE OF DISCHARGE:  09/10/2014  ADMITTING PHYSICIAN:   DATE OF DISCHARGE: Tana Conch. Leslye Peer, MD  DISCHARGING PHYSICIAN: Gladstone Lighter, MD   PRIMARY CARE PHYSICIAN: Venia Carbon, MD  [place]: Dr. Candiss Norse.    PRIMARY CARDIOLOGIST: Corey Skains, MD   PRIMARY NEPHROLOGIST: Tama High, MD   DISCHARGE DIAGNOSES:  1.  Acute renal failure on admission.  2.  Acute urinary retention and cystitis on presentation.  3.  Atrial fibrillation.  4.  Anxiety.  5.  Chronic kidney disease, stage 3, with baseline creatinine around 2.  6.  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  7.  Hypertension.   DISCHARGE HOME MEDICATIONS:  1.  Carvedilol 3.125 mg p.o. twice a day.  2.  Celexa 40 mg p.o. daily.  3.  Alprazolam 0.5 mg p.o. 3 times a day as needed.  4.  Calcitrol 0.25 mcg p.o. daily.  5.  Flexeril 5 mg p.o. twice a day as needed for muscle spasms.  6.  Febuxostat 40 mg 2 tablets daily.  7.  Norco 10/325 mg q. 6 hours p.r.n. for pain.  8.  Hydroxyzine 50 mg p.o. 3 times a day as needed.  9.  Lisinopril 5 mg p.o. daily.  10.  MiraLax powder p.r.n. for constipation.  11.  Pravastatin 80 mg p.o. daily.  12.  Warfarin 5 mg p.o. daily on all the days except Tuesday and Thursday.   13.  Warfarin 2.5 mg on Tuesday and Thursday.  14.  Flomax 0.4 mg p.o. at bedtime.  15.  Keflex 500 mg p.o. b.i.d. for 3 more days.   DISCHARGE HOME OXYGEN: 2 liters.   DISCHARGE DIET: Low-sodium diet.   DISCHARGE ACTIVITY: As tolerated.    FOLLOWUP INSTRUCTIONS:  1.  PCP followup in 1 week to see if Lasix can be restarted.  2.  Basic metabolic panel check by PCP in 1 week.  3.  Daily weight checks.   LABORATORIES AND IMAGING STUDIES: Prior to discharge, INR is 2.5. Sodium 137, potassium 3.4, chloride 103, bicarbonate 28, BUN 64,  creatinine 2.4, glucose 152, and calcium of 9.2. WBC 7.6, hemoglobin 12.0, hematocrit 37.2, platelet count is 152,000. Stool for C. difficile is negative. Ultrasound of kidneys showing significant medical renal disease, but no acute findings. Bladder is now fully distended, appearing otherwise normal. Urinalysis with nitrite positive, 3+ leukocyte esterase, 2+ bacteria, and 342 WBCs. Urine culture is growing E. coli which are pansensitive, and creatinine on admission was 4.8.   BRIEF HOSPITAL COURSE: Mr. Acevedo is a 74 year old elderly Caucasian male with past medical history significant for chronic atrial fibrillation on anticoagulation, chronic systolic CHF, EF of 55%, hypertension, CKD stage 3 with baseline creatinine around 2, comes to the hospital secondary to urinary retention and dysuria.  1.  Acute cystitis and urinary retention: Actually, the patient had decreased urination and not retention when he came in from dehydration. He was also noted to be in acute on chronic renal failure. Creatinine baseline is 2, but went up to 4.8, likely prerenal and ATN on admission. He was started on IV fluids, did not require a Foley catheter. Urine cultures were positive for E. coli. He was covered with antibiotics and then narrowed down to Keflex at the time of discharge. His symptoms have significantly improved. Creatinine is getting close to baseline at 2.4 at  the time of discharge. Renal ultrasound showed medical renal disease in the kidneys, but no acute obstruction or anything. He was seen by a nephrologist in the hospital. Since his symptoms were improving, he was being treated, he is being discharged home. Physical therapy recommended home health; however, the family refused home health as the patient has all the necessary  equipment at home and his daughter constantly checks on him so home health was discontinued. His Lasix, metolazone, and lisinopril were held at the time of discharge because of his acute  renal failure from ATN and prerenal condition.  2.  Chronic systolic CHF, EF is only 38%. He takes 2 diuretics at home; however, both were held at this time. The patient appeared clinically dry on presentation. The daughter has been taught how to watch for symptoms of dyspnea or weight gain and restart the Lasix. If not, Lasix can be restarted within a week after seen by PCP. Lisinopril is held at this time. The patient is on Coreg, which is being continued. His blood pressure was also low initially when he presented.  3.  Atrial fibrillation: Rate controlled on Coreg and also on anticoagulation with Coumadin and his INR has been therapeutic.  4.  His course has been otherwise uneventful in the hospital.   DISCHARGE CONDITION: Guarded, currently stable.   DISCHARGE DISPOSITION: Home as the patient and family refused home health.   TIME SPENT ON DISCHARGE: 40 minutes.   ____________________________ Gladstone Lighter, MD rk:ts D: 09/11/2014 14:28:00 ET T: 09/11/2014 21:04:51 ET JOB#: [id number]  cc: Gladstone Lighter, MD, <Dictator> [place] Lilian Kapur, MD Corey Skains, MD Venia Carbon, MD Gladstone Lighter MD ELECTRONICALLY SIGNED 09/29/2014 14:47

## 2015-01-23 ENCOUNTER — Other Ambulatory Visit: Payer: Self-pay | Admitting: Internal Medicine

## 2015-01-26 ENCOUNTER — Encounter: Payer: Self-pay | Admitting: Internal Medicine

## 2015-01-26 LAB — PROTIME-INR

## 2015-02-01 ENCOUNTER — Other Ambulatory Visit: Payer: Self-pay | Admitting: Internal Medicine

## 2015-02-02 ENCOUNTER — Encounter: Payer: Self-pay | Admitting: Internal Medicine

## 2015-02-02 DIAGNOSIS — I4891 Unspecified atrial fibrillation: Secondary | ICD-10-CM | POA: Diagnosis not present

## 2015-02-02 LAB — PROTIME-INR

## 2015-02-09 LAB — PROTIME-INR: INR: 2.2 — AB (ref 0.9–1.1)

## 2015-02-10 ENCOUNTER — Other Ambulatory Visit: Payer: Self-pay | Admitting: *Deleted

## 2015-02-10 ENCOUNTER — Other Ambulatory Visit: Payer: Self-pay | Admitting: Internal Medicine

## 2015-02-10 MED ORDER — ALPRAZOLAM 0.5 MG PO TABS: 0.5000 mg | ORAL_TABLET | Freq: Three times a day (TID) | ORAL | Status: DC | PRN

## 2015-02-10 NOTE — Telephone Encounter (Signed)
Approved: #[id number] X 0

## 2015-02-10 NOTE — Telephone Encounter (Signed)
rx called into pharmacy

## 2015-02-10 NOTE — Telephone Encounter (Signed)
01/14/2015 

## 2015-02-16 ENCOUNTER — Other Ambulatory Visit: Payer: Self-pay | Admitting: Internal Medicine

## 2015-02-16 ENCOUNTER — Encounter: Payer: Self-pay | Admitting: Internal Medicine

## 2015-02-16 LAB — PROTIME-INR

## 2015-02-23 ENCOUNTER — Encounter: Payer: Self-pay | Admitting: Internal Medicine

## 2015-02-23 ENCOUNTER — Other Ambulatory Visit: Payer: Self-pay | Admitting: *Deleted

## 2015-02-23 LAB — PROTIME-INR

## 2015-02-23 NOTE — Telephone Encounter (Signed)
Faxed request asking for  Lasix 80 mg 1 tab twice daily, but what we have in our chart is different, please advise

## 2015-02-24 MED ORDER — FUROSEMIDE 80 MG PO TABS: 80.0000 mg | ORAL_TABLET | Freq: Every day | ORAL | Status: DC | PRN

## 2015-02-24 NOTE — Telephone Encounter (Signed)
Approved: okay to fill for a year--- per the nephrologist

## 2015-02-24 NOTE — Telephone Encounter (Signed)
rx sent to pharmacy by e-script  

## 2015-02-25 DIAGNOSIS — I10 Essential (primary) hypertension: Secondary | ICD-10-CM | POA: Diagnosis not present

## 2015-02-25 DIAGNOSIS — R809 Proteinuria, unspecified: Secondary | ICD-10-CM | POA: Diagnosis not present

## 2015-02-25 DIAGNOSIS — I5022 Chronic systolic (congestive) heart failure: Secondary | ICD-10-CM | POA: Diagnosis not present

## 2015-02-25 DIAGNOSIS — I482 Chronic atrial fibrillation: Secondary | ICD-10-CM | POA: Diagnosis not present

## 2015-02-25 DIAGNOSIS — N184 Chronic kidney disease, stage 4 (severe): Secondary | ICD-10-CM | POA: Diagnosis not present

## 2015-02-25 DIAGNOSIS — I442 Atrioventricular block, complete: Secondary | ICD-10-CM | POA: Diagnosis not present

## 2015-02-25 DIAGNOSIS — N2581 Secondary hyperparathyroidism of renal origin: Secondary | ICD-10-CM | POA: Diagnosis not present

## 2015-03-02 ENCOUNTER — Encounter: Payer: Self-pay | Admitting: Internal Medicine

## 2015-03-02 DIAGNOSIS — I4891 Unspecified atrial fibrillation: Secondary | ICD-10-CM | POA: Diagnosis not present

## 2015-03-02 LAB — PROTIME-INR

## 2015-03-03 ENCOUNTER — Other Ambulatory Visit: Payer: Self-pay | Admitting: *Deleted

## 2015-03-03 MED ORDER — HYDROCODONE-ACETAMINOPHEN 10-325 MG PO TABS: 1.0000 | ORAL_TABLET | Freq: Four times a day (QID) | ORAL | Status: DC | PRN

## 2015-03-03 NOTE — Telephone Encounter (Signed)
01/04/15 

## 2015-03-03 NOTE — Telephone Encounter (Signed)
Spoke with patient and advised rx ready for pick-up and it will be at the front desk.  

## 2015-03-05 DIAGNOSIS — N184 Chronic kidney disease, stage 4 (severe): Secondary | ICD-10-CM | POA: Diagnosis not present

## 2015-03-09 ENCOUNTER — Encounter: Payer: Self-pay | Admitting: Internal Medicine

## 2015-03-09 LAB — PROTIME-INR

## 2015-03-16 ENCOUNTER — Ambulatory Visit (INDEPENDENT_AMBULATORY_CARE_PROVIDER_SITE_OTHER): Payer: Medicare Other | Admitting: *Deleted

## 2015-03-16 DIAGNOSIS — I4819 Other persistent atrial fibrillation: Secondary | ICD-10-CM

## 2015-03-16 DIAGNOSIS — I481 Persistent atrial fibrillation: Secondary | ICD-10-CM

## 2015-03-16 DIAGNOSIS — Z5181 Encounter for therapeutic drug level monitoring: Secondary | ICD-10-CM

## 2015-03-16 LAB — POCT INR: INR: 2.3

## 2015-03-16 NOTE — Progress Notes (Signed)
Pre visit review using our clinic review tool, if applicable. No additional management support is needed unless otherwise documented below in the visit note. 

## 2015-03-17 ENCOUNTER — Other Ambulatory Visit: Payer: Self-pay | Admitting: Internal Medicine

## 2015-03-17 NOTE — Telephone Encounter (Signed)
Approved: okay #[id number] x 0 

## 2015-03-17 NOTE — Telephone Encounter (Signed)
rx called into pharmacy

## 2015-03-17 NOTE — Telephone Encounter (Signed)
02/10/2015 

## 2015-03-19 DIAGNOSIS — I10 Essential (primary) hypertension: Secondary | ICD-10-CM | POA: Diagnosis not present

## 2015-03-19 DIAGNOSIS — N184 Chronic kidney disease, stage 4 (severe): Secondary | ICD-10-CM | POA: Diagnosis not present

## 2015-03-23 ENCOUNTER — Ambulatory Visit (INDEPENDENT_AMBULATORY_CARE_PROVIDER_SITE_OTHER): Payer: Medicare Other | Admitting: *Deleted

## 2015-03-23 DIAGNOSIS — I4819 Other persistent atrial fibrillation: Secondary | ICD-10-CM

## 2015-03-23 DIAGNOSIS — I481 Persistent atrial fibrillation: Secondary | ICD-10-CM

## 2015-03-23 DIAGNOSIS — Z5181 Encounter for therapeutic drug level monitoring: Secondary | ICD-10-CM

## 2015-03-23 LAB — POCT INR: INR: 2.5

## 2015-03-23 NOTE — Progress Notes (Signed)
Pre visit review using our clinic review tool, if applicable. No additional management support is needed unless otherwise documented below in the visit note. 

## 2015-03-25 DIAGNOSIS — I5022 Chronic systolic (congestive) heart failure: Secondary | ICD-10-CM | POA: Diagnosis not present

## 2015-03-25 DIAGNOSIS — R809 Proteinuria, unspecified: Secondary | ICD-10-CM | POA: Diagnosis not present

## 2015-03-25 DIAGNOSIS — N183 Chronic kidney disease, stage 3 (moderate): Secondary | ICD-10-CM | POA: Diagnosis not present

## 2015-03-25 DIAGNOSIS — M109 Gout, unspecified: Secondary | ICD-10-CM | POA: Diagnosis not present

## 2015-03-25 DIAGNOSIS — N184 Chronic kidney disease, stage 4 (severe): Secondary | ICD-10-CM | POA: Diagnosis not present

## 2015-03-25 DIAGNOSIS — N2581 Secondary hyperparathyroidism of renal origin: Secondary | ICD-10-CM | POA: Diagnosis not present

## 2015-03-31 DIAGNOSIS — I4891 Unspecified atrial fibrillation: Secondary | ICD-10-CM | POA: Diagnosis not present

## 2015-03-31 LAB — POCT INR: INR: 3

## 2015-04-01 ENCOUNTER — Ambulatory Visit (INDEPENDENT_AMBULATORY_CARE_PROVIDER_SITE_OTHER): Payer: Medicare Other | Admitting: *Deleted

## 2015-04-01 DIAGNOSIS — I481 Persistent atrial fibrillation: Secondary | ICD-10-CM

## 2015-04-01 DIAGNOSIS — I4819 Other persistent atrial fibrillation: Secondary | ICD-10-CM

## 2015-04-01 DIAGNOSIS — Z5181 Encounter for therapeutic drug level monitoring: Secondary | ICD-10-CM

## 2015-04-01 NOTE — Progress Notes (Signed)
Pre visit review using our clinic review tool, if applicable. No additional management support is needed unless otherwise documented below in the visit note. 

## 2015-04-07 ENCOUNTER — Ambulatory Visit (INDEPENDENT_AMBULATORY_CARE_PROVIDER_SITE_OTHER): Payer: Medicare Other | Admitting: *Deleted

## 2015-04-07 DIAGNOSIS — I481 Persistent atrial fibrillation: Secondary | ICD-10-CM

## 2015-04-07 DIAGNOSIS — Z5181 Encounter for therapeutic drug level monitoring: Secondary | ICD-10-CM

## 2015-04-07 DIAGNOSIS — I4819 Other persistent atrial fibrillation: Secondary | ICD-10-CM

## 2015-04-07 LAB — POCT INR: INR: 2.9

## 2015-04-07 NOTE — Progress Notes (Signed)
Pre visit review using our clinic review tool, if applicable. No additional management support is needed unless otherwise documented below in the visit note. 

## 2015-04-14 ENCOUNTER — Other Ambulatory Visit: Payer: Self-pay | Admitting: *Deleted

## 2015-04-14 ENCOUNTER — Ambulatory Visit (INDEPENDENT_AMBULATORY_CARE_PROVIDER_SITE_OTHER): Payer: Medicare Other | Admitting: *Deleted

## 2015-04-14 DIAGNOSIS — I4819 Other persistent atrial fibrillation: Secondary | ICD-10-CM

## 2015-04-14 DIAGNOSIS — I481 Persistent atrial fibrillation: Secondary | ICD-10-CM

## 2015-04-14 DIAGNOSIS — Z5181 Encounter for therapeutic drug level monitoring: Secondary | ICD-10-CM

## 2015-04-14 LAB — POCT INR: INR: 2.8

## 2015-04-14 NOTE — Telephone Encounter (Signed)
Last filled #90 03/17/15.  Patient scheduled for Medicare wellness tomorrow.

## 2015-04-14 NOTE — Progress Notes (Signed)
Pre visit review using our clinic review tool, if applicable. No additional management support is needed unless otherwise documented below in the visit note. 

## 2015-04-15 ENCOUNTER — Encounter: Payer: Self-pay | Admitting: Internal Medicine

## 2015-04-15 ENCOUNTER — Ambulatory Visit (INDEPENDENT_AMBULATORY_CARE_PROVIDER_SITE_OTHER): Payer: Medicare Other | Admitting: Internal Medicine

## 2015-04-15 ENCOUNTER — Other Ambulatory Visit: Payer: Self-pay | Admitting: Internal Medicine

## 2015-04-15 VITALS — BP 120/80 | HR 77 | Temp 97.9°F | Ht 70.0 in | Wt 244.0 lb

## 2015-04-15 DIAGNOSIS — J9611 Chronic respiratory failure with hypoxia: Secondary | ICD-10-CM | POA: Diagnosis not present

## 2015-04-15 DIAGNOSIS — N184 Chronic kidney disease, stage 4 (severe): Secondary | ICD-10-CM

## 2015-04-15 DIAGNOSIS — I482 Chronic atrial fibrillation, unspecified: Secondary | ICD-10-CM

## 2015-04-15 DIAGNOSIS — F411 Generalized anxiety disorder: Secondary | ICD-10-CM

## 2015-04-15 DIAGNOSIS — Z23 Encounter for immunization: Secondary | ICD-10-CM | POA: Diagnosis not present

## 2015-04-15 DIAGNOSIS — Z0000 Encounter for general adult medical examination without abnormal findings: Secondary | ICD-10-CM | POA: Diagnosis not present

## 2015-04-15 DIAGNOSIS — I5022 Chronic systolic (congestive) heart failure: Secondary | ICD-10-CM

## 2015-04-15 MED ORDER — ALPRAZOLAM 0.5 MG PO TABS: 0.5000 mg | ORAL_TABLET | Freq: Three times a day (TID) | ORAL | Status: DC | PRN

## 2015-04-15 MED ORDER — HYDROCODONE-ACETAMINOPHEN 10-325 MG PO TABS: 1.0000 | ORAL_TABLET | Freq: Four times a day (QID) | ORAL | Status: DC | PRN

## 2015-04-15 NOTE — Assessment & Plan Note (Signed)
Gets along with oxygen mostly just at night

## 2015-04-15 NOTE — Progress Notes (Signed)
Pre visit review using our clinic review tool, if applicable. No additional management support is needed unless otherwise documented below in the visit note. 

## 2015-04-15 NOTE — Addendum Note (Signed)
Addended by: [name] I on: 04/15/2015 05:06 PM   Modules accepted: Level of Service

## 2015-04-15 NOTE — Assessment & Plan Note (Signed)
I have personally reviewed the Medicare Annual Wellness questionnaire and have noted 1. The patient's medical and social history 2. Their use of alcohol, tobacco or illicit drugs 3. Their current medications and supplements 4. The patient's functional ability including ADL's, fall risks, home safety risks and hearing or visual             impairment. 5. Diet and physical activities 6. Evidence for depression or mood disorders  The patients weight, height, BMI and visual acuity have been recorded in the chart I have made referrals, counseling and provided education to the patient based review of the above and I have provided the pt with a written personalized care plan for preventive services.  I have provided you with a copy of your personalized plan for preventive services. Please take the time to review along with your updated medication list.  No cancer screening due to overall health status prevnar today--pneumovax next year No Td after discussion

## 2015-04-15 NOTE — Telephone Encounter (Signed)
Approved: okay #[id number] x 0 

## 2015-04-15 NOTE — Progress Notes (Signed)
Subjective:    Patient ID: Micheal [name], male    DOB: 08-09-1941, 74 y.o.   MRN: [id number]  HPI Here with daughter for Medicare wellness and follow up of chronic medical conditions Reviewed form and advanced directives Reviewed other physicians No alcohol or tobacco Unable to exercise Doesn't drive. Daughter handles finances and food preparation.  He is unable to handle stairs He is independent in ADLs . Generally continent Vision is okay Mild hearing loss--no functional problems No falls Does get occ depressed feelings--but can work out of it by reading magazines, etc. Not anhedonic At most mild memory issues only  He feels he is doing well Breathing seems to be okay Unable to do much activity Some chronic edema--but no worse Uses oxygen at night--generally hasn't needed it during the day . Has to use it when supine He does raise the [facility] when sleeping No PND No chest pain No palpitations Recent pacer check with Dr Cherlynn Kaiser with back pain Rx Feels current rx is good--stable for several years  Still has anxiety spells Mostly stays level on the alprazolam  Voids okay Uses urinal at night usually. Variable need Continues with Dr Holley Raring for CKD---recent GFR down some  Current Outpatient Prescriptions on File Prior to Visit  Medication Sig Dispense Refill  . ALPRAZolam (XANAX) 0.5 MG tablet Take 1 tablet (0.5 mg total) by mouth 3 (three) times daily as needed. 90 tablet 0  . calcitRIOL (ROCALTROL) 0.25 MCG capsule Take 0.25 mcg by mouth daily.    . carvedilol (COREG) 3.125 MG tablet TAKE ONE TABLET BY MOUTH TWICE DAILY WITH A MEAL 180 tablet 3  . citalopram (CELEXA) 40 MG tablet TAKE ONE TABLET BY MOUTH ONCE DAILY 90 tablet 3  . cyclobenzaprine (FLEXERIL) 5 MG tablet Take 1 tablet (5 mg total) by mouth 2 (two) times daily as needed for muscle spasms. 30 tablet 0  . febuxostat (ULORIC) 40 MG tablet Take 2 tablets (80 mg total) by mouth daily. 180 tablet 3  .  furosemide (LASIX) 80 MG tablet Take 1-2 tablets (80-160 mg total) by mouth daily as needed (for swelling and fluid per Dr. Holley Raring). 60 tablet 6  . HYDROcodone-acetaminophen (NORCO) 10-325 MG per tablet Take 1 tablet by mouth every 6 (six) hours as needed. 120 tablet 0  . hydrOXYzine (ATARAX/VISTARIL) 50 MG tablet Take 1 tablet (50 mg total) by mouth 3 (three) times daily as needed. 270 tablet 3  . KLOR-CON M10 10 MEQ tablet TAKE ONE TABLET BY MOUTH ONCE DAILY 90 tablet 3  . lisinopril (PRINIVIL,ZESTRIL) 5 MG tablet Take 2.5 mg by mouth daily.    . metolazone (ZAROXOLYN) 5 MG tablet Take 5 mg by mouth 2 (two) times daily.     . polyethylene glycol (MIRALAX / GLYCOLAX) packet Take 17 g by mouth daily.    . pravastatin (PRAVACHOL) 80 MG tablet TAKE ONE TABLET BY MOUTH ONCE DAILY 90 tablet 3  . tamsulosin (FLOMAX) 0.4 MG CAPS capsule Take 0.4 mg by mouth.    . warfarin (COUMADIN) 1 MG tablet Take 1/2 tablet every other day as directed by [facility]. 30 tablet 2  . warfarin (COUMADIN) 5 MG tablet TAKE ONE TABLET BY MOUTH AS DIRECTED 90 tablet 0   No current facility-administered medications on file prior to visit.    No Known Allergies  Past Medical History  Diagnosis Date  . Automatic implantable cardiac defibrillator in situ     St. Jude  . Other left  bundle branch block   . Atrial fibrillation   . Chronic systolic heart failure ~3833    EF under 25%  . Hypertension   . Anxiety   . CKD (chronic kidney disease) stage 3, GFR 30-59 ml/min 2009  . Gout, unspecified   . Chronic back pain     Past Surgical History  Procedure Laterality Date  . Icd implant      St. Jude Durata; 12/27/07-CHF, afib, LBB, 3 vessel disease  . Fracture of left humerus  2008    Rebroken 12/13    No family history on file.  History   Social History  . Marital Status: Single    Spouse Name: N/A  . Number of Children: 2  . Years of Education: N/A   Occupational History  . Retired as Adult nurse business   Social History Main Topics  . Smoking status: Former Smoker    Quit date: 09/25/1993  . Smokeless tobacco: Never Used     Comment: quit 15 years ago  . Alcohol Use: No     Comment: past use but not currently  . Drug Use: No  . Sexual Activity: Not on file   Other Topics Concern  . Not on file   Social History Narrative   Divorced   2 daughters   No living will   Requests daughter [name] as health care POA   Would accept resuscitation   Not sure about tube feeds   Review of Systems Sleeps okay--but keeps "odd" hours at times Appetite is good Weight is good Weighs daily--- fairly stable Wears seat belt Teeth okay--overdue for dentist Limited arthritis problems--just the back pain and knees mainly no skin ulcers are sig rash Legs do get dry and scaly    Objective:   Physical Exam  Constitutional: He is oriented to person, place, and time. He appears well-developed. No distress.  HENT:  Mouth/Throat: Oropharynx is clear and moist. No oropharyngeal exudate.  Poor dentition  Neck: Normal range of motion. Neck supple. No JVD present.  Cardiovascular: Normal rate, regular rhythm and normal heart sounds.  Exam reveals no gallop.   No murmur heard. Pulmonary/Chest: Effort normal and breath sounds normal. No respiratory distress. He has no wheezes. He has no rales.  Abdominal: Soft. There is no tenderness.  Musculoskeletal:  1+ tense swelling in calves and feet Considerable difficulty getting up on table   Lymphadenopathy:    He has no cervical adenopathy.  Neurological: He is alert and oriented to person, place, and time.  President-- "Mady Gemma, Algis Downs" [id number] D-l-r-o-w Recall 2/3  Skin:  Venous stasis changes and rubor in feet but no ulcers  Psychiatric: He has a normal mood and affect. His behavior is normal.          Assessment & Plan:

## 2015-04-15 NOTE — Assessment & Plan Note (Signed)
Hasn't really thought about dialysis but would need hemo--and he even wants resuscitation-- so I guess he would want dialysis trial if kidneys fail

## 2015-04-15 NOTE — Addendum Note (Signed)
Addended by: Despina Hidden on: 04/15/2015 05:04 PM   Modules accepted: Orders

## 2015-04-15 NOTE — Assessment & Plan Note (Signed)
Compensated No changes needed Will recheck labs

## 2015-04-15 NOTE — Addendum Note (Signed)
Addended by: Despina Hidden on: 04/15/2015 04:11 PM   Modules accepted: Orders

## 2015-04-15 NOTE — Assessment & Plan Note (Signed)
Doing well on regular alprazolam Has failed multiple SSRI's, etc

## 2015-04-15 NOTE — Assessment & Plan Note (Signed)
Paced No problems on the coumadin

## 2015-04-15 NOTE — Telephone Encounter (Signed)
rx called into pharmacy

## 2015-04-16 LAB — COMPREHENSIVE METABOLIC PANEL
ALBUMIN: 4.3 g/dL (ref 3.5–5.2)
ALT: 13 U/L (ref 0–53)
AST: 17 U/L (ref 0–37)
Alkaline Phosphatase: 50 U/L (ref 39–117)
BUN: 55 mg/dL — AB (ref 6–23)
CALCIUM: 10 mg/dL (ref 8.4–10.5)
CHLORIDE: 100 meq/L (ref 96–112)
CO2: 30 meq/L (ref 19–32)
Creatinine, Ser: 2.41 mg/dL — ABNORMAL HIGH (ref 0.40–1.50)
GFR: 28.25 mL/min — ABNORMAL LOW (ref >60.00)
Glucose, Bld: 82 mg/dL (ref 70–99)
Potassium: 4.1 mEq/L (ref 3.5–5.1)
Sodium: 140 mEq/L (ref 135–145)
TOTAL PROTEIN: 6.8 g/dL (ref 6.0–8.3)
Total Bilirubin: 0.7 mg/dL (ref 0.2–1.2)

## 2015-04-16 LAB — LIPID PANEL
CHOL/HDL RATIO: 3
Cholesterol: 97 mg/dL (ref 0–200)
HDL: 36.4 mg/dL — AB (ref >39.00)
LDL CALC: 49 mg/dL (ref 0–99)
NONHDL: 60.6
Triglycerides: 60 mg/dL (ref 0.0–149.0)
VLDL: 12 mg/dL (ref 0.0–40.0)

## 2015-04-16 LAB — CBC WITH DIFFERENTIAL/PLATELET
Basophils Absolute: 0 10*3/uL (ref 0.0–0.1)
Basophils Relative: 0.6 % (ref 0.0–3.0)
Eosinophils Absolute: 0.2 10*3/uL (ref 0.0–0.7)
Eosinophils Relative: 4.2 % (ref 0.0–5.0)
HEMATOCRIT: 42.3 % (ref 39.0–52.0)
HEMOGLOBIN: 13.8 g/dL (ref 13.0–17.0)
LYMPHS PCT: 21.5 % (ref 12.0–46.0)
Lymphs Abs: 1.2 10*3/uL (ref 0.7–4.0)
MCHC: 32.7 g/dL (ref 30.0–36.0)
MCV: 90.3 fl (ref 78.0–100.0)
Monocytes Absolute: 0.6 10*3/uL (ref 0.1–1.0)
Monocytes Relative: 11.6 % (ref 3.0–12.0)
NEUTROS ABS: 3.4 10*3/uL (ref 1.4–7.7)
Neutrophils Relative %: 62.1 % (ref 43.0–77.0)
PLATELETS: 135 10*3/uL — AB (ref 150.0–400.0)
RBC: 4.69 Mil/uL (ref 4.22–5.81)
RDW: 15.2 % (ref 11.5–15.5)
WBC: 5.4 10*3/uL (ref 4.0–10.5)

## 2015-04-16 LAB — T4, FREE: Free T4: 0.94 ng/dL (ref 0.60–1.60)

## 2015-04-16 NOTE — Progress Notes (Signed)
Fax to dr Nehemiah Massed [phone] $ dr Anthonette Legato  [phone] 7/22/rbh

## 2015-04-17 ENCOUNTER — Encounter: Payer: Self-pay | Admitting: Internal Medicine

## 2015-04-17 DIAGNOSIS — D696 Thrombocytopenia, unspecified: Secondary | ICD-10-CM | POA: Insufficient documentation

## 2015-04-19 ENCOUNTER — Encounter: Payer: Self-pay | Admitting: *Deleted

## 2015-04-21 ENCOUNTER — Ambulatory Visit (INDEPENDENT_AMBULATORY_CARE_PROVIDER_SITE_OTHER): Payer: Medicare Other | Admitting: *Deleted

## 2015-04-21 ENCOUNTER — Telehealth: Payer: Self-pay | Admitting: Internal Medicine

## 2015-04-21 DIAGNOSIS — Z5181 Encounter for therapeutic drug level monitoring: Secondary | ICD-10-CM

## 2015-04-21 DIAGNOSIS — I482 Chronic atrial fibrillation, unspecified: Secondary | ICD-10-CM

## 2015-04-21 LAB — POCT INR: INR: 3.2

## 2015-04-21 NOTE — Telephone Encounter (Signed)
Barnett Applebaum daughter would like to speak with you about INR

## 2015-04-21 NOTE — Telephone Encounter (Signed)
Spoke with daughter and advised results.  

## 2015-04-21 NOTE — Telephone Encounter (Signed)
Pt would like lab results and to talk about inr results as well cb number [phone]

## 2015-04-21 NOTE — Telephone Encounter (Signed)
Spoke to [employer] per [employer].  See anti-coagulation visit.

## 2015-04-21 NOTE — Progress Notes (Signed)
Pre visit review using our clinic review tool, if applicable. No additional management support is needed unless otherwise documented below in the visit note. 

## 2015-04-27 ENCOUNTER — Ambulatory Visit (INDEPENDENT_AMBULATORY_CARE_PROVIDER_SITE_OTHER): Payer: Medicare Other | Admitting: *Deleted

## 2015-04-27 DIAGNOSIS — I482 Chronic atrial fibrillation, unspecified: Secondary | ICD-10-CM

## 2015-04-27 DIAGNOSIS — Z5181 Encounter for therapeutic drug level monitoring: Secondary | ICD-10-CM

## 2015-04-27 DIAGNOSIS — I4891 Unspecified atrial fibrillation: Secondary | ICD-10-CM | POA: Diagnosis not present

## 2015-04-27 LAB — POCT INR: INR: 2.74

## 2015-04-27 NOTE — Progress Notes (Signed)
Pre visit review using our clinic review tool, if applicable. No additional management support is needed unless otherwise documented below in the visit note. 

## 2015-05-03 ENCOUNTER — Other Ambulatory Visit: Payer: Self-pay | Admitting: Internal Medicine

## 2015-05-04 ENCOUNTER — Other Ambulatory Visit: Payer: Self-pay | Admitting: *Deleted

## 2015-05-05 ENCOUNTER — Ambulatory Visit (INDEPENDENT_AMBULATORY_CARE_PROVIDER_SITE_OTHER): Payer: Medicare Other | Admitting: *Deleted

## 2015-05-05 DIAGNOSIS — Z5181 Encounter for therapeutic drug level monitoring: Secondary | ICD-10-CM

## 2015-05-05 DIAGNOSIS — I482 Chronic atrial fibrillation, unspecified: Secondary | ICD-10-CM

## 2015-05-05 LAB — POCT INR: INR: 2.5

## 2015-05-05 NOTE — Progress Notes (Signed)
Pre visit review using our clinic review tool, if applicable. No additional management support is needed unless otherwise documented below in the visit note. 

## 2015-05-11 LAB — POCT INR: INR: 2.8

## 2015-05-12 ENCOUNTER — Ambulatory Visit (INDEPENDENT_AMBULATORY_CARE_PROVIDER_SITE_OTHER): Payer: Medicare Other | Admitting: *Deleted

## 2015-05-12 DIAGNOSIS — I482 Chronic atrial fibrillation, unspecified: Secondary | ICD-10-CM

## 2015-05-12 DIAGNOSIS — Z5181 Encounter for therapeutic drug level monitoring: Secondary | ICD-10-CM

## 2015-05-12 NOTE — Progress Notes (Signed)
Pre visit review using our clinic review tool, if applicable. No additional management support is needed unless otherwise documented below in the visit note. 

## 2015-05-14 ENCOUNTER — Other Ambulatory Visit: Payer: Self-pay | Admitting: Internal Medicine

## 2015-05-17 NOTE — Telephone Encounter (Signed)
rx called into pharmacy

## 2015-05-17 NOTE — Telephone Encounter (Signed)
Approved: okay #[id number] x 0 

## 2015-05-17 NOTE — Telephone Encounter (Signed)
04/15/2015 

## 2015-05-18 ENCOUNTER — Ambulatory Visit (INDEPENDENT_AMBULATORY_CARE_PROVIDER_SITE_OTHER): Payer: Medicare Other | Admitting: *Deleted

## 2015-05-18 DIAGNOSIS — I482 Chronic atrial fibrillation, unspecified: Secondary | ICD-10-CM

## 2015-05-18 DIAGNOSIS — Z5181 Encounter for therapeutic drug level monitoring: Secondary | ICD-10-CM

## 2015-05-18 LAB — POCT INR: INR: 2.7

## 2015-05-18 NOTE — Progress Notes (Signed)
Pre visit review using our clinic review tool, if applicable. No additional management support is needed unless otherwise documented below in the visit note. 

## 2015-05-25 ENCOUNTER — Ambulatory Visit (INDEPENDENT_AMBULATORY_CARE_PROVIDER_SITE_OTHER): Payer: Medicare Other | Admitting: *Deleted

## 2015-05-25 DIAGNOSIS — I482 Chronic atrial fibrillation, unspecified: Secondary | ICD-10-CM

## 2015-05-25 DIAGNOSIS — Z5181 Encounter for therapeutic drug level monitoring: Secondary | ICD-10-CM

## 2015-05-25 DIAGNOSIS — I4891 Unspecified atrial fibrillation: Secondary | ICD-10-CM | POA: Diagnosis not present

## 2015-05-25 LAB — POCT INR: INR: 2.8

## 2015-05-25 NOTE — Progress Notes (Signed)
Pre visit review using our clinic review tool, if applicable. No additional management support is needed unless otherwise documented below in the visit note. 

## 2015-06-01 LAB — POCT INR: INR: 3.1

## 2015-06-03 ENCOUNTER — Ambulatory Visit (INDEPENDENT_AMBULATORY_CARE_PROVIDER_SITE_OTHER): Payer: Medicare Other | Admitting: *Deleted

## 2015-06-03 DIAGNOSIS — I482 Chronic atrial fibrillation, unspecified: Secondary | ICD-10-CM

## 2015-06-03 DIAGNOSIS — Z5181 Encounter for therapeutic drug level monitoring: Secondary | ICD-10-CM

## 2015-06-03 NOTE — Progress Notes (Signed)
Pre visit review using our clinic review tool, if applicable. No additional management support is needed unless otherwise documented below in the visit note. 

## 2015-06-04 DIAGNOSIS — R809 Proteinuria, unspecified: Secondary | ICD-10-CM | POA: Diagnosis not present

## 2015-06-04 DIAGNOSIS — N2581 Secondary hyperparathyroidism of renal origin: Secondary | ICD-10-CM | POA: Diagnosis not present

## 2015-06-04 DIAGNOSIS — M109 Gout, unspecified: Secondary | ICD-10-CM | POA: Diagnosis not present

## 2015-06-04 DIAGNOSIS — N184 Chronic kidney disease, stage 4 (severe): Secondary | ICD-10-CM | POA: Diagnosis not present

## 2015-06-08 LAB — POCT INR: INR: 2.3

## 2015-06-09 ENCOUNTER — Ambulatory Visit (INDEPENDENT_AMBULATORY_CARE_PROVIDER_SITE_OTHER): Payer: Medicare Other | Admitting: *Deleted

## 2015-06-09 DIAGNOSIS — Z5181 Encounter for therapeutic drug level monitoring: Secondary | ICD-10-CM

## 2015-06-09 DIAGNOSIS — I482 Chronic atrial fibrillation, unspecified: Secondary | ICD-10-CM

## 2015-06-09 NOTE — Progress Notes (Signed)
Pre visit review using our clinic review tool, if applicable. No additional management support is needed unless otherwise documented below in the visit note. 

## 2015-06-14 ENCOUNTER — Other Ambulatory Visit: Payer: Self-pay | Admitting: Internal Medicine

## 2015-06-15 ENCOUNTER — Other Ambulatory Visit: Payer: Self-pay | Admitting: Internal Medicine

## 2015-06-15 ENCOUNTER — Ambulatory Visit (INDEPENDENT_AMBULATORY_CARE_PROVIDER_SITE_OTHER): Payer: Medicare Other | Admitting: *Deleted

## 2015-06-15 DIAGNOSIS — Z5181 Encounter for therapeutic drug level monitoring: Secondary | ICD-10-CM

## 2015-06-15 DIAGNOSIS — I482 Chronic atrial fibrillation, unspecified: Secondary | ICD-10-CM

## 2015-06-15 LAB — POCT INR: INR: 2.8

## 2015-06-15 NOTE — Telephone Encounter (Signed)
rx called into pharmacy

## 2015-06-15 NOTE — Telephone Encounter (Signed)
05/17/2015 

## 2015-06-15 NOTE — Telephone Encounter (Signed)
Approved: #[id number] x 0 

## 2015-06-16 NOTE — Progress Notes (Signed)
Pre visit review using our clinic review tool, if applicable. No additional management support is needed unless otherwise documented below in the visit note. 

## 2015-06-22 DIAGNOSIS — I4891 Unspecified atrial fibrillation: Secondary | ICD-10-CM | POA: Diagnosis not present

## 2015-06-23 ENCOUNTER — Ambulatory Visit (INDEPENDENT_AMBULATORY_CARE_PROVIDER_SITE_OTHER): Payer: Medicare Other | Admitting: *Deleted

## 2015-06-23 DIAGNOSIS — Z5181 Encounter for therapeutic drug level monitoring: Secondary | ICD-10-CM

## 2015-06-23 DIAGNOSIS — I482 Chronic atrial fibrillation, unspecified: Secondary | ICD-10-CM

## 2015-06-23 LAB — POCT INR: INR: 2.8

## 2015-06-23 NOTE — Progress Notes (Signed)
Pre visit review using our clinic review tool, if applicable. No additional management support is needed unless otherwise documented below in the visit note. 

## 2015-06-29 LAB — POCT INR: INR: 2.7

## 2015-06-30 ENCOUNTER — Ambulatory Visit (INDEPENDENT_AMBULATORY_CARE_PROVIDER_SITE_OTHER): Payer: Medicare Other | Admitting: *Deleted

## 2015-06-30 DIAGNOSIS — Z5181 Encounter for therapeutic drug level monitoring: Secondary | ICD-10-CM

## 2015-06-30 DIAGNOSIS — I482 Chronic atrial fibrillation, unspecified: Secondary | ICD-10-CM

## 2015-06-30 NOTE — Progress Notes (Signed)
Pre visit review using our clinic review tool, if applicable. No additional management support is needed unless otherwise documented below in the visit note. 

## 2015-07-07 ENCOUNTER — Other Ambulatory Visit: Payer: Self-pay | Admitting: Internal Medicine

## 2015-07-07 ENCOUNTER — Ambulatory Visit (INDEPENDENT_AMBULATORY_CARE_PROVIDER_SITE_OTHER): Payer: Medicare Other | Admitting: *Deleted

## 2015-07-07 DIAGNOSIS — Z5181 Encounter for therapeutic drug level monitoring: Secondary | ICD-10-CM

## 2015-07-07 DIAGNOSIS — I482 Chronic atrial fibrillation, unspecified: Secondary | ICD-10-CM

## 2015-07-07 LAB — POCT INR: INR: 2.8

## 2015-07-07 MED ORDER — HYDROCODONE-ACETAMINOPHEN 10-325 MG PO TABS: 1.0000 | ORAL_TABLET | Freq: Four times a day (QID) | ORAL | Status: DC | PRN

## 2015-07-07 NOTE — Addendum Note (Signed)
Addended by: [name] I on: 07/07/2015 10:30 AM   Modules accepted: Orders

## 2015-07-07 NOTE — Telephone Encounter (Signed)
04/15/2015 

## 2015-07-07 NOTE — Telephone Encounter (Signed)
Patient's daughter called to get a refill on patient's Hydrocodone 10-325.  Please call [name] when prescription is ready.  If she's not home, please leave a message on her voice mail.

## 2015-07-07 NOTE — Progress Notes (Signed)
Pre visit review using our clinic review tool, if applicable. No additional management support is needed unless otherwise documented below in the visit note. 

## 2015-07-07 NOTE — Telephone Encounter (Signed)
Left message on machine that rx is ready for pick-up, and it will be at our front desk.  

## 2015-07-07 NOTE — Addendum Note (Signed)
Addended by: Despina Hidden on: 07/07/2015 09:02 AM   Modules accepted: Orders

## 2015-07-08 DIAGNOSIS — I482 Chronic atrial fibrillation: Secondary | ICD-10-CM | POA: Diagnosis not present

## 2015-07-08 DIAGNOSIS — E782 Mixed hyperlipidemia: Secondary | ICD-10-CM | POA: Diagnosis not present

## 2015-07-08 DIAGNOSIS — I442 Atrioventricular block, complete: Secondary | ICD-10-CM | POA: Diagnosis not present

## 2015-07-08 DIAGNOSIS — I420 Dilated cardiomyopathy: Secondary | ICD-10-CM | POA: Diagnosis not present

## 2015-07-10 ENCOUNTER — Other Ambulatory Visit: Payer: Self-pay | Admitting: Internal Medicine

## 2015-07-12 NOTE — Telephone Encounter (Signed)
Approved: #90 x 0 can be sent on Wednesday

## 2015-07-12 NOTE — Telephone Encounter (Signed)
06/15/2015 Not due until thursday

## 2015-07-12 NOTE — Telephone Encounter (Signed)
rx called into pharmacy

## 2015-07-14 LAB — POCT INR: INR: 2.9

## 2015-07-15 ENCOUNTER — Ambulatory Visit (INDEPENDENT_AMBULATORY_CARE_PROVIDER_SITE_OTHER): Payer: Medicare Other | Admitting: *Deleted

## 2015-07-15 DIAGNOSIS — I482 Chronic atrial fibrillation, unspecified: Secondary | ICD-10-CM

## 2015-07-15 DIAGNOSIS — Z5181 Encounter for therapeutic drug level monitoring: Secondary | ICD-10-CM

## 2015-07-15 NOTE — Progress Notes (Signed)
Pre visit review using our clinic review tool, if applicable. No additional management support is needed unless otherwise documented below in the visit note. 

## 2015-07-20 ENCOUNTER — Ambulatory Visit (INDEPENDENT_AMBULATORY_CARE_PROVIDER_SITE_OTHER): Payer: Medicare Other | Admitting: *Deleted

## 2015-07-20 DIAGNOSIS — Z5181 Encounter for therapeutic drug level monitoring: Secondary | ICD-10-CM

## 2015-07-20 DIAGNOSIS — I482 Chronic atrial fibrillation, unspecified: Secondary | ICD-10-CM

## 2015-07-20 DIAGNOSIS — I4891 Unspecified atrial fibrillation: Secondary | ICD-10-CM | POA: Diagnosis not present

## 2015-07-20 LAB — POCT INR: INR: 3.2

## 2015-07-20 NOTE — Progress Notes (Signed)
Pre visit review using our clinic review tool, if applicable. No additional management support is needed unless otherwise documented below in the visit note. 

## 2015-07-28 ENCOUNTER — Ambulatory Visit (INDEPENDENT_AMBULATORY_CARE_PROVIDER_SITE_OTHER): Payer: Medicare Other | Admitting: *Deleted

## 2015-07-28 DIAGNOSIS — I482 Chronic atrial fibrillation, unspecified: Secondary | ICD-10-CM

## 2015-07-28 DIAGNOSIS — Z5181 Encounter for therapeutic drug level monitoring: Secondary | ICD-10-CM

## 2015-07-28 LAB — POCT INR: INR: 2.9

## 2015-07-28 NOTE — Progress Notes (Signed)
Pre visit review using our clinic review tool, if applicable. No additional management support is needed unless otherwise documented below in the visit note. 

## 2015-08-03 ENCOUNTER — Ambulatory Visit (INDEPENDENT_AMBULATORY_CARE_PROVIDER_SITE_OTHER): Payer: Medicare Other | Admitting: *Deleted

## 2015-08-03 DIAGNOSIS — I482 Chronic atrial fibrillation, unspecified: Secondary | ICD-10-CM

## 2015-08-03 DIAGNOSIS — Z5181 Encounter for therapeutic drug level monitoring: Secondary | ICD-10-CM

## 2015-08-03 LAB — POCT INR: INR: 2.7

## 2015-08-03 NOTE — Progress Notes (Signed)
Pre visit review using our clinic review tool, if applicable. No additional management support is needed unless otherwise documented below in the visit note. 

## 2015-08-10 ENCOUNTER — Other Ambulatory Visit: Payer: Self-pay | Admitting: Internal Medicine

## 2015-08-10 ENCOUNTER — Ambulatory Visit (INDEPENDENT_AMBULATORY_CARE_PROVIDER_SITE_OTHER): Payer: Medicare Other | Admitting: *Deleted

## 2015-08-10 DIAGNOSIS — I482 Chronic atrial fibrillation, unspecified: Secondary | ICD-10-CM

## 2015-08-10 DIAGNOSIS — Z5181 Encounter for therapeutic drug level monitoring: Secondary | ICD-10-CM

## 2015-08-10 LAB — POCT INR: INR: 3.1

## 2015-08-10 NOTE — Progress Notes (Signed)
Pre visit review using our clinic review tool, if applicable. No additional management support is needed unless otherwise documented below in the visit note. 

## 2015-08-11 NOTE — Telephone Encounter (Signed)
07/12/2015 

## 2015-08-11 NOTE — Telephone Encounter (Signed)
Approved: #[id number] x 0 

## 2015-08-11 NOTE — Telephone Encounter (Signed)
rx called into pharmacy

## 2015-08-17 DIAGNOSIS — I4891 Unspecified atrial fibrillation: Secondary | ICD-10-CM | POA: Diagnosis not present

## 2015-08-17 LAB — POCT INR: INR: 2.8

## 2015-08-18 ENCOUNTER — Ambulatory Visit (INDEPENDENT_AMBULATORY_CARE_PROVIDER_SITE_OTHER): Payer: Medicare Other | Admitting: *Deleted

## 2015-08-18 DIAGNOSIS — I482 Chronic atrial fibrillation, unspecified: Secondary | ICD-10-CM

## 2015-08-18 DIAGNOSIS — Z5181 Encounter for therapeutic drug level monitoring: Secondary | ICD-10-CM

## 2015-08-18 NOTE — Progress Notes (Signed)
Pre visit review using our clinic review tool, if applicable. No additional management support is needed unless otherwise documented below in the visit note. 

## 2015-08-24 ENCOUNTER — Ambulatory Visit (INDEPENDENT_AMBULATORY_CARE_PROVIDER_SITE_OTHER): Payer: Medicare Other | Admitting: *Deleted

## 2015-08-24 DIAGNOSIS — I482 Chronic atrial fibrillation, unspecified: Secondary | ICD-10-CM

## 2015-08-24 DIAGNOSIS — Z5181 Encounter for therapeutic drug level monitoring: Secondary | ICD-10-CM

## 2015-08-24 LAB — POCT INR: INR: 2.5

## 2015-08-24 NOTE — Progress Notes (Signed)
Pre visit review using our clinic review tool, if applicable. No additional management support is needed unless otherwise documented below in the visit note. 

## 2015-08-28 ENCOUNTER — Other Ambulatory Visit: Payer: Self-pay | Admitting: Internal Medicine

## 2015-08-31 ENCOUNTER — Ambulatory Visit (INDEPENDENT_AMBULATORY_CARE_PROVIDER_SITE_OTHER): Payer: Medicare Other | Admitting: *Deleted

## 2015-08-31 DIAGNOSIS — I482 Chronic atrial fibrillation, unspecified: Secondary | ICD-10-CM

## 2015-08-31 DIAGNOSIS — Z5181 Encounter for therapeutic drug level monitoring: Secondary | ICD-10-CM

## 2015-08-31 LAB — POCT INR: INR: 3.4

## 2015-08-31 NOTE — Progress Notes (Signed)
Pre visit review using our clinic review tool, if applicable. No additional management support is needed unless otherwise documented below in the visit note. 

## 2015-09-01 ENCOUNTER — Telehealth: Payer: Self-pay | Admitting: Internal Medicine

## 2015-09-01 MED ORDER — HYDROCODONE-ACETAMINOPHEN 10-325 MG PO TABS: 1.0000 | ORAL_TABLET | Freq: Four times a day (QID) | ORAL | Status: DC | PRN

## 2015-09-01 NOTE — Telephone Encounter (Signed)
Daughter called to refill HYDROcodone-acetaminophen (NORCO) 10-325 MG .  Last refilled 07/06/15.  Please leave message on [phone] / lt

## 2015-09-02 NOTE — Telephone Encounter (Signed)
Left message on machine that rx is ready for pick-up, and it will be at our front desk.  

## 2015-09-02 NOTE — Telephone Encounter (Signed)
done

## 2015-09-02 NOTE — Telephone Encounter (Signed)
Script on your desk to be signed.

## 2015-09-07 ENCOUNTER — Ambulatory Visit (INDEPENDENT_AMBULATORY_CARE_PROVIDER_SITE_OTHER): Payer: Medicare Other | Admitting: *Deleted

## 2015-09-07 DIAGNOSIS — Z5181 Encounter for therapeutic drug level monitoring: Secondary | ICD-10-CM | POA: Diagnosis not present

## 2015-09-07 DIAGNOSIS — I482 Chronic atrial fibrillation, unspecified: Secondary | ICD-10-CM

## 2015-09-07 LAB — POCT INR: INR: 3.4

## 2015-09-07 NOTE — Progress Notes (Signed)
Pre visit review using our clinic review tool, if applicable. No additional management support is needed unless otherwise documented below in the visit note. 

## 2015-09-11 ENCOUNTER — Other Ambulatory Visit: Payer: Self-pay | Admitting: Internal Medicine

## 2015-09-13 NOTE — Telephone Encounter (Signed)
Last filled 08/11/2015 [facility] PATIENT, Please send back to me for call in

## 2015-09-14 NOTE — Telephone Encounter (Signed)
Please call in.  Thanks.   

## 2015-09-14 NOTE — Telephone Encounter (Signed)
rx called into pharmacy

## 2015-09-15 ENCOUNTER — Ambulatory Visit (INDEPENDENT_AMBULATORY_CARE_PROVIDER_SITE_OTHER): Payer: Medicare Other | Admitting: *Deleted

## 2015-09-15 DIAGNOSIS — Z5181 Encounter for therapeutic drug level monitoring: Secondary | ICD-10-CM

## 2015-09-15 DIAGNOSIS — I482 Chronic atrial fibrillation, unspecified: Secondary | ICD-10-CM

## 2015-09-15 DIAGNOSIS — I4891 Unspecified atrial fibrillation: Secondary | ICD-10-CM | POA: Diagnosis not present

## 2015-09-15 LAB — POCT INR: INR: 2.1

## 2015-09-15 NOTE — Progress Notes (Signed)
Pre visit review using our clinic review tool, if applicable. No additional management support is needed unless otherwise documented below in the visit note. 

## 2015-09-16 DIAGNOSIS — R809 Proteinuria, unspecified: Secondary | ICD-10-CM | POA: Diagnosis not present

## 2015-09-16 DIAGNOSIS — I5022 Chronic systolic (congestive) heart failure: Secondary | ICD-10-CM | POA: Diagnosis not present

## 2015-09-16 DIAGNOSIS — N2581 Secondary hyperparathyroidism of renal origin: Secondary | ICD-10-CM | POA: Diagnosis not present

## 2015-09-16 DIAGNOSIS — N184 Chronic kidney disease, stage 4 (severe): Secondary | ICD-10-CM | POA: Diagnosis not present

## 2015-09-17 ENCOUNTER — Telehealth: Payer: Self-pay | Admitting: Internal Medicine

## 2015-09-17 DIAGNOSIS — I739 Peripheral vascular disease, unspecified: Secondary | ICD-10-CM | POA: Diagnosis not present

## 2015-09-17 DIAGNOSIS — L97521 Non-pressure chronic ulcer of other part of left foot limited to breakdown of skin: Secondary | ICD-10-CM | POA: Diagnosis not present

## 2015-09-17 NOTE — Telephone Encounter (Signed)
Pts daughter/care taker called about pt having issues with anxiety the last few days. The pt is taking his xanax as prescribed but still having issues with anxiety attacks. She would like to know if he needs to be seen or if the medication needs to be adjusted?   C# [phone]

## 2015-09-17 NOTE — Telephone Encounter (Signed)
Spoke with daughter and advised results.  

## 2015-09-17 NOTE — Telephone Encounter (Signed)
I would not change meds unless this is a persistent change. Wait briefly and if the increased anxiety (not unusual at holiday time) persists, set up an appt to discuss options

## 2015-09-21 ENCOUNTER — Ambulatory Visit (INDEPENDENT_AMBULATORY_CARE_PROVIDER_SITE_OTHER): Payer: Medicare Other | Admitting: *Deleted

## 2015-09-21 DIAGNOSIS — I482 Chronic atrial fibrillation, unspecified: Secondary | ICD-10-CM

## 2015-09-21 DIAGNOSIS — Z5181 Encounter for therapeutic drug level monitoring: Secondary | ICD-10-CM

## 2015-09-21 LAB — POCT INR: INR: 1.8

## 2015-09-21 NOTE — Progress Notes (Signed)
Pre visit review using our clinic review tool, if applicable. No additional management support is needed unless otherwise documented below in the visit note. 

## 2015-09-22 DIAGNOSIS — N184 Chronic kidney disease, stage 4 (severe): Secondary | ICD-10-CM | POA: Diagnosis not present

## 2015-09-22 DIAGNOSIS — M79609 Pain in unspecified limb: Secondary | ICD-10-CM | POA: Diagnosis not present

## 2015-09-22 DIAGNOSIS — I10 Essential (primary) hypertension: Secondary | ICD-10-CM | POA: Diagnosis not present

## 2015-09-22 DIAGNOSIS — N2581 Secondary hyperparathyroidism of renal origin: Secondary | ICD-10-CM | POA: Diagnosis not present

## 2015-09-22 DIAGNOSIS — R809 Proteinuria, unspecified: Secondary | ICD-10-CM | POA: Diagnosis not present

## 2015-09-22 DIAGNOSIS — L97521 Non-pressure chronic ulcer of other part of left foot limited to breakdown of skin: Secondary | ICD-10-CM | POA: Diagnosis not present

## 2015-09-22 DIAGNOSIS — I5022 Chronic systolic (congestive) heart failure: Secondary | ICD-10-CM | POA: Diagnosis not present

## 2015-09-29 ENCOUNTER — Ambulatory Visit (INDEPENDENT_AMBULATORY_CARE_PROVIDER_SITE_OTHER): Payer: Medicare Other | Admitting: *Deleted

## 2015-09-29 DIAGNOSIS — I482 Chronic atrial fibrillation, unspecified: Secondary | ICD-10-CM

## 2015-09-29 DIAGNOSIS — Z5181 Encounter for therapeutic drug level monitoring: Secondary | ICD-10-CM

## 2015-09-29 LAB — POCT INR: INR: 1.5

## 2015-09-29 NOTE — Progress Notes (Signed)
Pre visit review using our clinic review tool, if applicable. No additional management support is needed unless otherwise documented below in the visit note. 

## 2015-10-05 ENCOUNTER — Ambulatory Visit (INDEPENDENT_AMBULATORY_CARE_PROVIDER_SITE_OTHER): Payer: Medicare Other | Admitting: *Deleted

## 2015-10-05 DIAGNOSIS — Z5181 Encounter for therapeutic drug level monitoring: Secondary | ICD-10-CM

## 2015-10-05 DIAGNOSIS — I482 Chronic atrial fibrillation, unspecified: Secondary | ICD-10-CM

## 2015-10-05 LAB — POCT INR: INR: 2.4

## 2015-10-05 NOTE — Progress Notes (Signed)
Pre visit review using our clinic review tool, if applicable. No additional management support is needed unless otherwise documented below in the visit note. 

## 2015-10-11 DIAGNOSIS — I890 Lymphedema, not elsewhere classified: Secondary | ICD-10-CM | POA: Diagnosis not present

## 2015-10-11 DIAGNOSIS — I83222 Varicose veins of left lower extremity with both ulcer of calf and inflammation: Secondary | ICD-10-CM | POA: Diagnosis not present

## 2015-10-11 DIAGNOSIS — I739 Peripheral vascular disease, unspecified: Secondary | ICD-10-CM | POA: Diagnosis not present

## 2015-10-11 DIAGNOSIS — M79609 Pain in unspecified limb: Secondary | ICD-10-CM | POA: Diagnosis not present

## 2015-10-11 DIAGNOSIS — I509 Heart failure, unspecified: Secondary | ICD-10-CM | POA: Diagnosis not present

## 2015-10-11 DIAGNOSIS — L97522 Non-pressure chronic ulcer of other part of left foot with fat layer exposed: Secondary | ICD-10-CM | POA: Diagnosis not present

## 2015-10-11 DIAGNOSIS — I2510 Atherosclerotic heart disease of native coronary artery without angina pectoris: Secondary | ICD-10-CM | POA: Diagnosis not present

## 2015-10-11 DIAGNOSIS — I872 Venous insufficiency (chronic) (peripheral): Secondary | ICD-10-CM | POA: Diagnosis not present

## 2015-10-12 ENCOUNTER — Other Ambulatory Visit: Payer: Self-pay | Admitting: *Deleted

## 2015-10-12 DIAGNOSIS — I4891 Unspecified atrial fibrillation: Secondary | ICD-10-CM | POA: Diagnosis not present

## 2015-10-12 LAB — POCT INR: INR: 1.9

## 2015-10-12 NOTE — Telephone Encounter (Signed)
Faxed refill request. Last Filled:    90 tablet 0 09/14/2015  Please advise.

## 2015-10-13 ENCOUNTER — Ambulatory Visit (INDEPENDENT_AMBULATORY_CARE_PROVIDER_SITE_OTHER): Payer: Medicare Other | Admitting: *Deleted

## 2015-10-13 DIAGNOSIS — I482 Chronic atrial fibrillation, unspecified: Secondary | ICD-10-CM

## 2015-10-13 DIAGNOSIS — Z5181 Encounter for therapeutic drug level monitoring: Secondary | ICD-10-CM

## 2015-10-13 MED ORDER — ALPRAZOLAM 0.5 MG PO TABS: 0.5000 mg | ORAL_TABLET | Freq: Three times a day (TID) | ORAL | Status: DC | PRN

## 2015-10-13 NOTE — Telephone Encounter (Signed)
Already approved

## 2015-10-13 NOTE — Telephone Encounter (Signed)
09/14/2015 

## 2015-10-13 NOTE — Telephone Encounter (Signed)
Approved: #[id number] x 0 

## 2015-10-13 NOTE — Telephone Encounter (Signed)
rx called into pharmacy

## 2015-10-14 DIAGNOSIS — I872 Venous insufficiency (chronic) (peripheral): Secondary | ICD-10-CM | POA: Diagnosis not present

## 2015-10-14 DIAGNOSIS — I480 Paroxysmal atrial fibrillation: Secondary | ICD-10-CM | POA: Diagnosis not present

## 2015-10-14 DIAGNOSIS — I482 Chronic atrial fibrillation: Secondary | ICD-10-CM | POA: Diagnosis not present

## 2015-10-14 DIAGNOSIS — I5022 Chronic systolic (congestive) heart failure: Secondary | ICD-10-CM | POA: Diagnosis not present

## 2015-10-14 NOTE — Progress Notes (Signed)
Pre visit review using our clinic review tool, if applicable. No additional management support is needed unless otherwise documented below in the visit note. 

## 2015-10-17 ENCOUNTER — Other Ambulatory Visit: Payer: Self-pay | Admitting: Internal Medicine

## 2015-10-19 ENCOUNTER — Ambulatory Visit (INDEPENDENT_AMBULATORY_CARE_PROVIDER_SITE_OTHER): Payer: Medicare Other | Admitting: *Deleted

## 2015-10-19 DIAGNOSIS — I482 Chronic atrial fibrillation, unspecified: Secondary | ICD-10-CM

## 2015-10-19 DIAGNOSIS — Z5181 Encounter for therapeutic drug level monitoring: Secondary | ICD-10-CM

## 2015-10-19 LAB — POCT INR: INR: 2.1

## 2015-10-19 NOTE — Progress Notes (Signed)
Pre visit review using our clinic review tool, if applicable. No additional management support is needed unless otherwise documented below in the visit note. 

## 2015-10-21 ENCOUNTER — Ambulatory Visit (INDEPENDENT_AMBULATORY_CARE_PROVIDER_SITE_OTHER): Payer: Medicare Other | Admitting: Internal Medicine

## 2015-10-21 ENCOUNTER — Encounter: Payer: Self-pay | Admitting: Internal Medicine

## 2015-10-21 VITALS — BP 110/70 | HR 75 | Temp 98.0°F | Wt 234.0 lb

## 2015-10-21 DIAGNOSIS — N184 Chronic kidney disease, stage 4 (severe): Secondary | ICD-10-CM

## 2015-10-21 DIAGNOSIS — I480 Paroxysmal atrial fibrillation: Secondary | ICD-10-CM | POA: Diagnosis not present

## 2015-10-21 DIAGNOSIS — D696 Thrombocytopenia, unspecified: Secondary | ICD-10-CM

## 2015-10-21 DIAGNOSIS — J9611 Chronic respiratory failure with hypoxia: Secondary | ICD-10-CM

## 2015-10-21 DIAGNOSIS — F39 Unspecified mood [affective] disorder: Secondary | ICD-10-CM

## 2015-10-21 DIAGNOSIS — I5022 Chronic systolic (congestive) heart failure: Secondary | ICD-10-CM

## 2015-10-21 MED ORDER — HYDROCODONE-ACETAMINOPHEN 10-325 MG PO TABS: 1.0000 | ORAL_TABLET | Freq: Four times a day (QID) | ORAL | Status: DC | PRN

## 2015-10-21 NOTE — Assessment & Plan Note (Signed)
Paced No symptoms Still on coumadin

## 2015-10-21 NOTE — Progress Notes (Signed)
Pre visit review using our clinic review tool, if applicable. No additional management support is needed unless otherwise documented below in the visit note. 

## 2015-10-21 NOTE — Assessment & Plan Note (Signed)
Chronic anxiety and panic Does okay with current meds but not a candidate for any weaning

## 2015-10-21 NOTE — Assessment & Plan Note (Signed)
No excessive bleeding

## 2015-10-21 NOTE — Progress Notes (Signed)
Subjective:    Patient ID: Micheal [name], male    DOB: [birth year], 75 y.o.   MRN: [id number]  HPI Here with daughter for follow up of chronic medical conditions  He feels he is doing okay Still has panic attacks at times--- usually at night. The alprazolam does help---uses it regularly No sadness or depression. Not anhedonic--reads, TV  Continues with Dr Holley Raring Renal function stable No apparent uremic symptoms  No palpitations Pacer check done recently--okay No chest pain  Breathing is okay Still needs the oxygen at night--generally hasn't needed it in day lately No cough No dizziness  Current Outpatient Prescriptions on File Prior to Visit  Medication Sig Dispense Refill  . ALPRAZolam (XANAX) 0.5 MG tablet Take 1 tablet (0.5 mg total) by mouth 3 (three) times daily as needed. 90 tablet 0  . calcitRIOL (ROCALTROL) 0.25 MCG capsule Take 0.25 mcg by mouth daily.    . carvedilol (COREG) 3.125 MG tablet TAKE ONE TABLET BY MOUTH TWICE DAILY WITH A MEAL 180 tablet 3  . citalopram (CELEXA) 40 MG tablet TAKE ONE TABLET BY MOUTH ONCE DAILY 90 tablet 3  . cyclobenzaprine (FLEXERIL) 5 MG tablet Take 1 tablet (5 mg total) by mouth 2 (two) times daily as needed for muscle spasms. 30 tablet 0  . febuxostat (ULORIC) 40 MG tablet Take 2 tablets (80 mg total) by mouth daily. 180 tablet 3  . furosemide (LASIX) 80 MG tablet TAKE ONE TO TWO TABLETS BY MOUTH ONCE DAILY AS NEEDED FOR SWELLING AND FLUID 60 tablet 0  . hydrOXYzine (ATARAX/VISTARIL) 50 MG tablet Take 1 tablet (50 mg total) by mouth 3 (three) times daily as needed. 270 tablet 3  . KLOR-CON M10 10 MEQ tablet TAKE ONE TABLET BY MOUTH ONCE DAILY 90 tablet 3  . lisinopril (PRINIVIL,ZESTRIL) 5 MG tablet Take 2.5 mg by mouth daily.    . polyethylene glycol (MIRALAX / GLYCOLAX) packet Take 17 g by mouth daily.    . pravastatin (PRAVACHOL) 80 MG tablet TAKE ONE TABLET BY MOUTH ONCE DAILY 90 tablet 3  . tamsulosin (FLOMAX) 0.4 MG CAPS  capsule Take 0.4 mg by mouth.    . warfarin (COUMADIN) 5 MG tablet TAKE AS DIRECTED BY ANTI-COAGULATION CLINIC 90 tablet 0   No current facility-administered medications on file prior to visit.    No Known Allergies  Past Medical History  Diagnosis Date  . Automatic implantable cardiac defibrillator in situ     St. Jude  . Other left bundle branch block   . Atrial fibrillation ([place])   . Chronic systolic heart failure (HCC) ~2009    EF under 25%  . Hypertension   . Anxiety   . CKD (chronic kidney disease) stage 3, GFR 30-59 ml/min 2009  . Gout, unspecified   . Chronic back pain   . Thrombocytopenia [facility])     Past Surgical History  Procedure Laterality Date  . Icd implant      St. Jude Durata; 12/27/07-CHF, afib, LBB, 3 vessel disease  . Fracture of left humerus  2008    Rebroken 12/13    No family history on file.  Social History   Social History  . Marital Status: Single    Spouse Name: N/A  . Number of Children: 2  . Years of Education: N/A   Occupational History  . 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 Smoking status: Former Audiological scientist  date: 09/25/1993  . Smokeless tobacco: Never Used     Comment: quit 15 years ago  . Alcohol Use: No     Comment: past use but not currently  . Drug Use: No  . Sexual Activity: Not on file   Other Topics Concern  . Not on file   Social History Narrative   Divorced   2 daughters   No living will   Requests daughter [name] as health care POA   Would accept resuscitation   Not sure about tube feeds   Review of Systems Appetite is good. Avoids salt Sleeps fairly well--unless having panic Will have small areas of bruising --mostly on arms No blood in stool or urine    Objective:   Physical Exam  Constitutional: He appears well-developed. No distress.  Neck: Normal range of motion. Neck supple. No thyromegaly present.  Cardiovascular: Normal rate, regular rhythm and normal  heart sounds.  Exam reveals no gallop.   No murmur heard. Pulmonary/Chest: Effort normal. No respiratory distress. He has no wheezes. He has no rales.  Decreased breath sounds but clear No dullness  Musculoskeletal:  Calves full but no pitting  Lymphadenopathy:    He has no cervical adenopathy.  Psychiatric: He has a normal mood and affect. His behavior is normal.          Assessment & Plan:

## 2015-10-21 NOTE — Assessment & Plan Note (Signed)
Stable status Has upcoming follow up with Dr Cammie Sickle

## 2015-10-21 NOTE — Assessment & Plan Note (Signed)
Only needs the oxygen at night

## 2015-10-21 NOTE — Assessment & Plan Note (Signed)
Has been stable Follows with Dr Holley Raring regularly

## 2015-10-26 ENCOUNTER — Ambulatory Visit (INDEPENDENT_AMBULATORY_CARE_PROVIDER_SITE_OTHER): Payer: Medicare Other | Admitting: *Deleted

## 2015-10-26 DIAGNOSIS — I480 Paroxysmal atrial fibrillation: Secondary | ICD-10-CM

## 2015-10-26 DIAGNOSIS — Z5181 Encounter for therapeutic drug level monitoring: Secondary | ICD-10-CM

## 2015-10-26 LAB — POCT INR: INR: 2.1

## 2015-10-26 NOTE — Progress Notes (Signed)
Pre visit review using our clinic review tool, if applicable. No additional management support is needed unless otherwise documented below in the visit note. 

## 2015-11-06 ENCOUNTER — Other Ambulatory Visit: Payer: Self-pay | Admitting: Internal Medicine

## 2015-11-06 NOTE — Telephone Encounter (Signed)
Is patient supposed to continue medication? Please advise

## 2015-11-09 DIAGNOSIS — I4891 Unspecified atrial fibrillation: Secondary | ICD-10-CM | POA: Diagnosis not present

## 2015-11-09 LAB — POCT INR: INR: 1.8

## 2015-11-10 ENCOUNTER — Ambulatory Visit (INDEPENDENT_AMBULATORY_CARE_PROVIDER_SITE_OTHER): Payer: Medicare Other | Admitting: *Deleted

## 2015-11-10 DIAGNOSIS — I480 Paroxysmal atrial fibrillation: Secondary | ICD-10-CM

## 2015-11-10 DIAGNOSIS — Z5181 Encounter for therapeutic drug level monitoring: Secondary | ICD-10-CM

## 2015-11-10 NOTE — Progress Notes (Signed)
Pre visit review using our clinic review tool, if applicable. No additional management support is needed unless otherwise documented below in the visit note. 

## 2015-11-11 ENCOUNTER — Other Ambulatory Visit: Payer: Self-pay | Admitting: Internal Medicine

## 2015-11-11 NOTE — Telephone Encounter (Signed)
rx called into pharmacy

## 2015-11-11 NOTE — Telephone Encounter (Signed)
Approved: #[id number] x 0 

## 2015-11-11 NOTE — Telephone Encounter (Signed)
10/13/2015 

## 2015-11-15 LAB — POCT INR: INR: 2

## 2015-11-16 ENCOUNTER — Ambulatory Visit (INDEPENDENT_AMBULATORY_CARE_PROVIDER_SITE_OTHER): Payer: Medicare Other | Admitting: *Deleted

## 2015-11-16 DIAGNOSIS — Z5181 Encounter for therapeutic drug level monitoring: Secondary | ICD-10-CM

## 2015-11-16 DIAGNOSIS — I480 Paroxysmal atrial fibrillation: Secondary | ICD-10-CM

## 2015-11-16 NOTE — Progress Notes (Signed)
Pre visit review using our clinic review tool, if applicable. No additional management support is needed unless otherwise documented below in the visit note. 

## 2015-11-21 ENCOUNTER — Other Ambulatory Visit: Payer: Self-pay | Admitting: Internal Medicine

## 2015-11-23 LAB — POCT INR: INR: 2.6

## 2015-11-24 ENCOUNTER — Ambulatory Visit (INDEPENDENT_AMBULATORY_CARE_PROVIDER_SITE_OTHER): Payer: Medicare Other | Admitting: *Deleted

## 2015-11-24 DIAGNOSIS — I480 Paroxysmal atrial fibrillation: Secondary | ICD-10-CM

## 2015-11-24 DIAGNOSIS — Z5181 Encounter for therapeutic drug level monitoring: Secondary | ICD-10-CM

## 2015-11-24 NOTE — Progress Notes (Signed)
Pre visit review using our clinic review tool, if applicable. No additional management support is needed unless otherwise documented below in the visit note. 

## 2015-11-30 ENCOUNTER — Ambulatory Visit (INDEPENDENT_AMBULATORY_CARE_PROVIDER_SITE_OTHER): Payer: Medicare Other | Admitting: *Deleted

## 2015-11-30 DIAGNOSIS — I480 Paroxysmal atrial fibrillation: Secondary | ICD-10-CM

## 2015-11-30 DIAGNOSIS — Z5181 Encounter for therapeutic drug level monitoring: Secondary | ICD-10-CM

## 2015-11-30 LAB — POCT INR: INR: 2.5

## 2015-11-30 NOTE — Progress Notes (Signed)
Pre visit review using our clinic review tool, if applicable. No additional management support is needed unless otherwise documented below in the visit note. 

## 2015-12-07 DIAGNOSIS — I4891 Unspecified atrial fibrillation: Secondary | ICD-10-CM | POA: Diagnosis not present

## 2015-12-07 LAB — POCT INR: INR: 2.4

## 2015-12-08 ENCOUNTER — Ambulatory Visit (INDEPENDENT_AMBULATORY_CARE_PROVIDER_SITE_OTHER): Payer: Medicare Other | Admitting: *Deleted

## 2015-12-08 DIAGNOSIS — I480 Paroxysmal atrial fibrillation: Secondary | ICD-10-CM

## 2015-12-08 DIAGNOSIS — Z5181 Encounter for therapeutic drug level monitoring: Secondary | ICD-10-CM

## 2015-12-08 NOTE — Progress Notes (Signed)
Pre visit review using our clinic review tool, if applicable. No additional management support is needed unless otherwise documented below in the visit note. 

## 2015-12-09 ENCOUNTER — Other Ambulatory Visit: Payer: Self-pay | Admitting: Internal Medicine

## 2015-12-09 NOTE — Telephone Encounter (Signed)
Ok to phone in Xanax. Furosemide sent electronically

## 2015-12-09 NOTE — Telephone Encounter (Signed)
Alprazolam: Last filled 11-11-15 #90/0 Furosemide: Last CMET 04-15-15 Last filled 11-08-15 #60  Last OV 10-21-15 Next OV 04-27-16  Forward to [facility] in Dr Alla German absence

## 2015-12-09 NOTE — Telephone Encounter (Signed)
Left refill on voice mail at pharmacy  

## 2015-12-14 LAB — POCT INR: INR: 2.7

## 2015-12-15 ENCOUNTER — Ambulatory Visit (INDEPENDENT_AMBULATORY_CARE_PROVIDER_SITE_OTHER): Payer: Medicare Other | Admitting: *Deleted

## 2015-12-15 DIAGNOSIS — Z5181 Encounter for therapeutic drug level monitoring: Secondary | ICD-10-CM

## 2015-12-15 DIAGNOSIS — I480 Paroxysmal atrial fibrillation: Secondary | ICD-10-CM

## 2015-12-15 NOTE — Progress Notes (Signed)
Pre visit review using our clinic review tool, if applicable. No additional management support is needed unless otherwise documented below in the visit note. 

## 2015-12-22 ENCOUNTER — Ambulatory Visit (INDEPENDENT_AMBULATORY_CARE_PROVIDER_SITE_OTHER): Payer: Medicare Other | Admitting: *Deleted

## 2015-12-22 DIAGNOSIS — Z5181 Encounter for therapeutic drug level monitoring: Secondary | ICD-10-CM

## 2015-12-22 DIAGNOSIS — I480 Paroxysmal atrial fibrillation: Secondary | ICD-10-CM

## 2015-12-22 LAB — POCT INR: INR: 1.8

## 2015-12-22 NOTE — Progress Notes (Signed)
Pre visit review using our clinic review tool, if applicable. No additional management support is needed unless otherwise documented below in the visit note. 

## 2015-12-25 ENCOUNTER — Other Ambulatory Visit: Payer: Self-pay | Admitting: Internal Medicine

## 2015-12-28 LAB — POCT INR: INR: 2.6

## 2015-12-29 ENCOUNTER — Ambulatory Visit (INDEPENDENT_AMBULATORY_CARE_PROVIDER_SITE_OTHER): Payer: Medicare Other | Admitting: *Deleted

## 2015-12-29 DIAGNOSIS — Z5181 Encounter for therapeutic drug level monitoring: Secondary | ICD-10-CM

## 2015-12-29 DIAGNOSIS — I480 Paroxysmal atrial fibrillation: Secondary | ICD-10-CM

## 2015-12-29 NOTE — Progress Notes (Signed)
Pre visit review using our clinic review tool, if applicable. No additional management support is needed unless otherwise documented below in the visit note. 

## 2016-01-03 ENCOUNTER — Other Ambulatory Visit: Payer: Self-pay

## 2016-01-03 NOTE — Telephone Encounter (Signed)
Pt left v/m requesting rx hydrocodone apap. Call when ready for pick up. Pt last seen and rx last printed # 120 on 10/21/15.

## 2016-01-04 ENCOUNTER — Ambulatory Visit (INDEPENDENT_AMBULATORY_CARE_PROVIDER_SITE_OTHER): Payer: Medicare Other | Admitting: *Deleted

## 2016-01-04 DIAGNOSIS — I480 Paroxysmal atrial fibrillation: Secondary | ICD-10-CM

## 2016-01-04 DIAGNOSIS — I4891 Unspecified atrial fibrillation: Secondary | ICD-10-CM | POA: Diagnosis not present

## 2016-01-04 DIAGNOSIS — Z5181 Encounter for therapeutic drug level monitoring: Secondary | ICD-10-CM

## 2016-01-04 LAB — POCT INR: INR: 2

## 2016-01-04 MED ORDER — HYDROCODONE-ACETAMINOPHEN 10-325 MG PO TABS: 1.0000 | ORAL_TABLET | Freq: Four times a day (QID) | ORAL | Status: DC | PRN

## 2016-01-04 NOTE — Progress Notes (Signed)
Pre visit review using our clinic review tool, if applicable. No additional management support is needed unless otherwise documented below in the visit note. 

## 2016-01-04 NOTE — Telephone Encounter (Signed)
[name], daughter on [place], aware rx is up front

## 2016-01-06 ENCOUNTER — Other Ambulatory Visit: Payer: Self-pay | Admitting: Internal Medicine

## 2016-01-06 NOTE — Telephone Encounter (Signed)
Last refill 12-09-15 #90/0 Last OV 10-21-15 Next OV 04-27-16. Can you approve in Dr Alla German absence

## 2016-01-06 NOTE — Telephone Encounter (Signed)
Left refill on voice mail at pharmacy  

## 2016-01-11 DIAGNOSIS — I442 Atrioventricular block, complete: Secondary | ICD-10-CM | POA: Diagnosis not present

## 2016-01-11 DIAGNOSIS — E1122 Type 2 diabetes mellitus with diabetic chronic kidney disease: Secondary | ICD-10-CM | POA: Diagnosis not present

## 2016-01-11 DIAGNOSIS — I482 Chronic atrial fibrillation: Secondary | ICD-10-CM | POA: Diagnosis not present

## 2016-01-11 DIAGNOSIS — I10 Essential (primary) hypertension: Secondary | ICD-10-CM | POA: Diagnosis not present

## 2016-01-11 DIAGNOSIS — Z950 Presence of cardiac pacemaker: Secondary | ICD-10-CM | POA: Diagnosis not present

## 2016-01-11 DIAGNOSIS — I420 Dilated cardiomyopathy: Secondary | ICD-10-CM | POA: Diagnosis not present

## 2016-01-11 DIAGNOSIS — E782 Mixed hyperlipidemia: Secondary | ICD-10-CM | POA: Diagnosis not present

## 2016-01-11 DIAGNOSIS — I5022 Chronic systolic (congestive) heart failure: Secondary | ICD-10-CM | POA: Diagnosis not present

## 2016-01-11 LAB — POCT INR: INR: 2.1

## 2016-01-12 ENCOUNTER — Ambulatory Visit (INDEPENDENT_AMBULATORY_CARE_PROVIDER_SITE_OTHER): Payer: Medicare Other | Admitting: *Deleted

## 2016-01-12 DIAGNOSIS — I480 Paroxysmal atrial fibrillation: Secondary | ICD-10-CM

## 2016-01-12 DIAGNOSIS — Z5181 Encounter for therapeutic drug level monitoring: Secondary | ICD-10-CM

## 2016-01-12 NOTE — Progress Notes (Signed)
Pre visit review using our clinic review tool, if applicable. No additional management support is needed unless otherwise documented below in the visit note. 

## 2016-01-14 ENCOUNTER — Other Ambulatory Visit: Payer: Self-pay | Admitting: Internal Medicine

## 2016-01-17 NOTE — Telephone Encounter (Signed)
Last filled 12/07/15 by Baity--please advise if pt is to continue medication

## 2016-01-17 NOTE — Telephone Encounter (Signed)
Approved: refill for a year 

## 2016-01-18 LAB — POCT INR: INR: 2.2

## 2016-01-19 ENCOUNTER — Ambulatory Visit (INDEPENDENT_AMBULATORY_CARE_PROVIDER_SITE_OTHER): Payer: Medicare Other | Admitting: *Deleted

## 2016-01-19 DIAGNOSIS — Z5181 Encounter for therapeutic drug level monitoring: Secondary | ICD-10-CM

## 2016-01-19 DIAGNOSIS — I480 Paroxysmal atrial fibrillation: Secondary | ICD-10-CM

## 2016-01-19 NOTE — Progress Notes (Signed)
Pre visit review using our clinic review tool, if applicable. No additional management support is needed unless otherwise documented below in the visit note. 

## 2016-01-21 DIAGNOSIS — N184 Chronic kidney disease, stage 4 (severe): Secondary | ICD-10-CM | POA: Diagnosis not present

## 2016-01-21 DIAGNOSIS — M109 Gout, unspecified: Secondary | ICD-10-CM | POA: Diagnosis not present

## 2016-01-21 DIAGNOSIS — R809 Proteinuria, unspecified: Secondary | ICD-10-CM | POA: Diagnosis not present

## 2016-01-21 DIAGNOSIS — N2581 Secondary hyperparathyroidism of renal origin: Secondary | ICD-10-CM | POA: Diagnosis not present

## 2016-01-21 DIAGNOSIS — I5022 Chronic systolic (congestive) heart failure: Secondary | ICD-10-CM | POA: Diagnosis not present

## 2016-01-25 LAB — POCT INR: INR: 2

## 2016-01-26 ENCOUNTER — Ambulatory Visit (INDEPENDENT_AMBULATORY_CARE_PROVIDER_SITE_OTHER): Payer: Medicare Other | Admitting: *Deleted

## 2016-01-26 DIAGNOSIS — I480 Paroxysmal atrial fibrillation: Secondary | ICD-10-CM

## 2016-01-26 DIAGNOSIS — Z5181 Encounter for therapeutic drug level monitoring: Secondary | ICD-10-CM

## 2016-01-26 NOTE — Progress Notes (Signed)
Pre visit review using our clinic review tool, if applicable. No additional management support is needed unless otherwise documented below in the visit note. 

## 2016-01-29 ENCOUNTER — Other Ambulatory Visit: Payer: Self-pay | Admitting: Internal Medicine

## 2016-02-01 DIAGNOSIS — I4891 Unspecified atrial fibrillation: Secondary | ICD-10-CM | POA: Diagnosis not present

## 2016-02-01 LAB — POCT INR: INR: 2

## 2016-02-02 ENCOUNTER — Ambulatory Visit (INDEPENDENT_AMBULATORY_CARE_PROVIDER_SITE_OTHER): Payer: Medicare Other | Admitting: *Deleted

## 2016-02-02 DIAGNOSIS — Z5181 Encounter for therapeutic drug level monitoring: Secondary | ICD-10-CM

## 2016-02-02 DIAGNOSIS — I480 Paroxysmal atrial fibrillation: Secondary | ICD-10-CM

## 2016-02-02 NOTE — Progress Notes (Signed)
Pre visit review using our clinic review tool, if applicable. No additional management support is needed unless otherwise documented below in the visit note. 

## 2016-02-09 ENCOUNTER — Ambulatory Visit (INDEPENDENT_AMBULATORY_CARE_PROVIDER_SITE_OTHER): Payer: Medicare Other | Admitting: *Deleted

## 2016-02-09 ENCOUNTER — Other Ambulatory Visit: Payer: Self-pay | Admitting: Internal Medicine

## 2016-02-09 DIAGNOSIS — Z5181 Encounter for therapeutic drug level monitoring: Secondary | ICD-10-CM

## 2016-02-09 DIAGNOSIS — I480 Paroxysmal atrial fibrillation: Secondary | ICD-10-CM

## 2016-02-09 LAB — POCT INR: INR: 1.9

## 2016-02-09 NOTE — Progress Notes (Signed)
Pre visit review using our clinic review tool, if applicable. No additional management support is needed unless otherwise documented below in the visit note. 

## 2016-02-10 NOTE — Telephone Encounter (Signed)
Approved: #[id number] x 0 

## 2016-02-10 NOTE — Telephone Encounter (Signed)
Pt has CPE scheduled on 04/27/16, last filled on 01/06/16 #90 with 0 refills

## 2016-02-11 NOTE — Telephone Encounter (Signed)
Left refill on voice mail at pharmacy  

## 2016-02-16 ENCOUNTER — Ambulatory Visit (INDEPENDENT_AMBULATORY_CARE_PROVIDER_SITE_OTHER): Payer: Medicare Other | Admitting: *Deleted

## 2016-02-16 DIAGNOSIS — Z5181 Encounter for therapeutic drug level monitoring: Secondary | ICD-10-CM

## 2016-02-16 DIAGNOSIS — I480 Paroxysmal atrial fibrillation: Secondary | ICD-10-CM

## 2016-02-16 LAB — POCT INR: INR: 2.2

## 2016-02-16 NOTE — Progress Notes (Signed)
Pre visit review using our clinic review tool, if applicable. No additional management support is needed unless otherwise documented below in the visit note. 

## 2016-02-22 ENCOUNTER — Other Ambulatory Visit: Payer: Self-pay

## 2016-02-22 MED ORDER — HYDROCODONE-ACETAMINOPHEN 10-325 MG PO TABS: 1.0000 | ORAL_TABLET | Freq: Four times a day (QID) | ORAL | Status: DC | PRN

## 2016-02-22 NOTE — Telephone Encounter (Signed)
Spoke to [place]. RX up front ready for pickup

## 2016-02-22 NOTE — Telephone Encounter (Signed)
Micheal [name] (DPR signed) left v/m requesting rx hydrocodone apap. Call when ready for pick up.last printed # 120 on 01/04/16 and pt last seen on 10/21/15.

## 2016-02-23 LAB — POCT INR: INR: 2.2

## 2016-02-24 ENCOUNTER — Ambulatory Visit (INDEPENDENT_AMBULATORY_CARE_PROVIDER_SITE_OTHER): Payer: Medicare Other | Admitting: *Deleted

## 2016-02-24 DIAGNOSIS — I480 Paroxysmal atrial fibrillation: Secondary | ICD-10-CM

## 2016-02-24 DIAGNOSIS — Z5181 Encounter for therapeutic drug level monitoring: Secondary | ICD-10-CM

## 2016-02-24 NOTE — Progress Notes (Signed)
Pre visit review using our clinic review tool, if applicable. No additional management support is needed unless otherwise documented below in the visit note. 

## 2016-02-29 DIAGNOSIS — I4891 Unspecified atrial fibrillation: Secondary | ICD-10-CM | POA: Diagnosis not present

## 2016-02-29 LAB — POCT INR: INR: 2.8

## 2016-03-01 ENCOUNTER — Ambulatory Visit (INDEPENDENT_AMBULATORY_CARE_PROVIDER_SITE_OTHER): Payer: Medicare Other | Admitting: *Deleted

## 2016-03-01 DIAGNOSIS — Z5181 Encounter for therapeutic drug level monitoring: Secondary | ICD-10-CM

## 2016-03-01 DIAGNOSIS — I480 Paroxysmal atrial fibrillation: Secondary | ICD-10-CM

## 2016-03-01 NOTE — Progress Notes (Signed)
Pre visit review using our clinic review tool, if applicable. No additional management support is needed unless otherwise documented below in the visit note. 

## 2016-03-07 LAB — PROTIME-INR

## 2016-03-08 ENCOUNTER — Encounter: Payer: Self-pay | Admitting: Internal Medicine

## 2016-03-10 ENCOUNTER — Other Ambulatory Visit: Payer: Self-pay | Admitting: Internal Medicine

## 2016-03-13 NOTE — Telephone Encounter (Signed)
Left refill on voice mail at pharmacy  

## 2016-03-13 NOTE — Telephone Encounter (Signed)
Approved: #[id number] x 0 

## 2016-03-13 NOTE — Telephone Encounter (Signed)
Last filled 02-11-16 #90 Last OV 04-14-16 Next OV 05-24-16

## 2016-03-15 ENCOUNTER — Encounter: Payer: Self-pay | Admitting: Internal Medicine

## 2016-03-15 LAB — PROTIME-INR

## 2016-03-21 ENCOUNTER — Telehealth: Payer: Self-pay | Admitting: Internal Medicine

## 2016-03-21 ENCOUNTER — Encounter: Payer: Self-pay | Admitting: Internal Medicine

## 2016-03-21 LAB — PROTIME-INR

## 2016-03-21 NOTE — Telephone Encounter (Signed)
Daughter [name] called returning your call - please call - [phone]

## 2016-03-21 NOTE — Telephone Encounter (Signed)
See lab result

## 2016-03-22 ENCOUNTER — Other Ambulatory Visit: Payer: Self-pay | Admitting: Internal Medicine

## 2016-03-22 ENCOUNTER — Encounter: Payer: Self-pay | Admitting: Internal Medicine

## 2016-03-22 LAB — PROTIME-INR: INR: 2.5 — AB (ref 0.9–1.1)

## 2016-03-24 ENCOUNTER — Other Ambulatory Visit: Payer: Self-pay | Admitting: Internal Medicine

## 2016-03-28 DIAGNOSIS — I4891 Unspecified atrial fibrillation: Secondary | ICD-10-CM | POA: Diagnosis not present

## 2016-03-28 LAB — PROTIME-INR: INR: 2.3 — AB (ref 0.9–1.1)

## 2016-03-30 ENCOUNTER — Other Ambulatory Visit: Payer: Self-pay | Admitting: Internal Medicine

## 2016-04-04 LAB — PROTIME-INR: INR: 2 — AB (ref 0.9–1.1)

## 2016-04-05 ENCOUNTER — Other Ambulatory Visit: Payer: Self-pay | Admitting: Internal Medicine

## 2016-04-10 ENCOUNTER — Other Ambulatory Visit: Payer: Self-pay | Admitting: Internal Medicine

## 2016-04-10 NOTE — Telephone Encounter (Signed)
Last filled 03-13-16 #90 Last OV 10-21-15 Next OV 04-27-16

## 2016-04-11 ENCOUNTER — Other Ambulatory Visit: Payer: Self-pay | Admitting: Internal Medicine

## 2016-04-11 NOTE — Telephone Encounter (Signed)
Rx called to pharmacy as instructed. 

## 2016-04-11 NOTE — Telephone Encounter (Signed)
Approved: #[id number] x 0 

## 2016-04-18 DIAGNOSIS — N2581 Secondary hyperparathyroidism of renal origin: Secondary | ICD-10-CM | POA: Diagnosis not present

## 2016-04-18 DIAGNOSIS — I10 Essential (primary) hypertension: Secondary | ICD-10-CM | POA: Diagnosis not present

## 2016-04-18 DIAGNOSIS — N184 Chronic kidney disease, stage 4 (severe): Secondary | ICD-10-CM | POA: Diagnosis not present

## 2016-04-19 ENCOUNTER — Encounter: Payer: Self-pay | Admitting: Internal Medicine

## 2016-04-19 LAB — PROTIME-INR: INR: 2.3 — AB (ref 0.9–1.1)

## 2016-04-20 ENCOUNTER — Telehealth: Payer: Self-pay

## 2016-04-20 NOTE — Telephone Encounter (Signed)
Micheal [name] received results for INR from resulted 04/19/16 2.3---per Baity pt is to continue current therapy... I have called twice with no answer---Left detailed msg on VM per HIPAA today with result and instructions---will send result to be scanned into chart

## 2016-04-22 ENCOUNTER — Other Ambulatory Visit: Payer: Self-pay | Admitting: Internal Medicine

## 2016-04-25 DIAGNOSIS — I4891 Unspecified atrial fibrillation: Secondary | ICD-10-CM | POA: Diagnosis not present

## 2016-04-25 LAB — PROTIME-INR: INR: 2.5 — AB (ref 0.9–1.1)

## 2016-04-27 ENCOUNTER — Ambulatory Visit (INDEPENDENT_AMBULATORY_CARE_PROVIDER_SITE_OTHER): Payer: Medicare Other | Admitting: Internal Medicine

## 2016-04-27 ENCOUNTER — Encounter: Payer: Self-pay | Admitting: Internal Medicine

## 2016-04-27 VITALS — BP 108/78 | HR 75 | Temp 98.3°F | Ht 69.5 in | Wt 238.0 lb

## 2016-04-27 DIAGNOSIS — G8929 Other chronic pain: Secondary | ICD-10-CM

## 2016-04-27 DIAGNOSIS — Z23 Encounter for immunization: Secondary | ICD-10-CM | POA: Diagnosis not present

## 2016-04-27 DIAGNOSIS — N184 Chronic kidney disease, stage 4 (severe): Secondary | ICD-10-CM | POA: Diagnosis not present

## 2016-04-27 DIAGNOSIS — I5022 Chronic systolic (congestive) heart failure: Secondary | ICD-10-CM | POA: Diagnosis not present

## 2016-04-27 DIAGNOSIS — Z0000 Encounter for general adult medical examination without abnormal findings: Secondary | ICD-10-CM | POA: Diagnosis not present

## 2016-04-27 DIAGNOSIS — J9611 Chronic respiratory failure with hypoxia: Secondary | ICD-10-CM | POA: Diagnosis not present

## 2016-04-27 DIAGNOSIS — I4891 Unspecified atrial fibrillation: Secondary | ICD-10-CM

## 2016-04-27 DIAGNOSIS — F39 Unspecified mood [affective] disorder: Secondary | ICD-10-CM

## 2016-04-27 DIAGNOSIS — M549 Dorsalgia, unspecified: Secondary | ICD-10-CM

## 2016-04-27 MED ORDER — HYDROCODONE-ACETAMINOPHEN 10-325 MG PO TABS: 1.0000 | ORAL_TABLET | Freq: Four times a day (QID) | ORAL | 0 refills | Status: DC | PRN

## 2016-04-27 NOTE — Assessment & Plan Note (Signed)
Paced Continues on coumadin--home monitoring

## 2016-04-27 NOTE — Progress Notes (Signed)
Pre visit review using our clinic review tool, if applicable. No additional management support is needed unless otherwise documented below in the visit note. 

## 2016-04-27 NOTE — Assessment & Plan Note (Signed)
Chronic anxiety and some dysthymia Doing okay Continue current meds

## 2016-04-27 NOTE — Assessment & Plan Note (Signed)
Will continue the hydrocodone CSRS checked--only Rxs from me

## 2016-04-27 NOTE — Assessment & Plan Note (Signed)
I have personally reviewed the Medicare Annual Wellness questionnaire and have noted 1. The patient's medical and social history 2. Their use of alcohol, tobacco or illicit drugs 3. Their current medications and supplements 4. The patient's functional ability including ADL's, fall risks, home safety risks and hearing or visual             impairment. 5. Diet and physical activities 6. Evidence for depression or mood disorders  The patients weight, height, BMI and visual acuity have been recorded in the chart I have made referrals, counseling and provided education to the patient based review of the above and I have provided the pt with a written personalized care plan for preventive services.  I have provided you with a copy of your personalized plan for preventive services. Please take the time to review along with your updated medication list.  Pneumovax today No PSA due to age Doesn't want to screen for colon cancer

## 2016-04-27 NOTE — Assessment & Plan Note (Signed)
Mild persistent fluid overload in legs--but good in lungs They are good about monitoring his weight, etc

## 2016-04-27 NOTE — Assessment & Plan Note (Signed)
Labs recently done by Dr Holley Raring Will await his results

## 2016-04-27 NOTE — Assessment & Plan Note (Signed)
Does well with the oxygen just at night now

## 2016-04-27 NOTE — Progress Notes (Signed)
Subjective:    Patient ID: Micheal [name], male    DOB: October 06, 1940, 75 y.o.   MRN: [id number]  HPI Here with daughter for Medicare wellness visit and follow up of chronic health conditions Reviewed form and advanced directives Reviewed other doctors No tobacco now. No alcohol Did have fall--slid to floor recently. No injury but needed help to get up Doesn't drive Does all ADLs---daughter brings prepared food and does the heavy housework. Daughter comes twice a day. Vision is okay Hearing seems to be okay also No sig cognitive issues  Having problems with his legs "Fluid bumps" by ankles Not overly sore Seems to be related to edema--- will pop at times Sometimes have yellow ooze Leg swelling is stable--but persistent. Some weeping at times Does weight daily--will go up a few pounds episodically Most days gets furosemide 80 bid --and adjusts with Dr Holley Raring Metolazone once a week No chest pain No dizziness or syncope No palpitaitons  Mood is okay Chronic anxiety--he tries to take things slow overall No persistent depression--at most brief down times  Ongoing chronic back pain Still gets reasonable relief from the hydrocodone CSRS checked--no Rx from any other doctor  Does bruise easily--but no apparent change Just had blood work for Dr Holley Raring and will see Dr Nehemiah Massed later in the month  Current Outpatient Prescriptions on File Prior to Visit  Medication Sig Dispense Refill  . ALPRAZolam (XANAX) 0.5 MG tablet TAKE ONE TABLET BY MOUTH THREE TIMES DAILY AS NEEDED 90 tablet 0  . calcitRIOL (ROCALTROL) 0.25 MCG capsule Take 0.25 mcg by mouth daily.    . carvedilol (COREG) 3.125 MG tablet TAKE ONE TABLET BY MOUTH TWICE DAILY WITH A MEAL 180 tablet 1  . citalopram (CELEXA) 40 MG tablet TAKE ONE TABLET BY MOUTH ONCE DAILY 90 tablet 0  . cyclobenzaprine (FLEXERIL) 5 MG tablet Take 1 tablet (5 mg total) by mouth 2 (two) times daily as needed for muscle spasms. 30 tablet 0  .  febuxostat (ULORIC) 40 MG tablet Take 2 tablets (80 mg total) by mouth daily. 180 tablet 3  . furosemide (LASIX) 80 MG tablet TAKE ONE TO TWO TABLETS BY MOUTH ONCE DAILY AS NEEDED FOR SWELLING AND FLUID 60 tablet 11  . HYDROcodone-acetaminophen (NORCO) 10-325 MG tablet Take 1 tablet by mouth every 6 (six) hours as needed. 120 tablet 0  . hydrOXYzine (ATARAX/VISTARIL) 50 MG tablet TAKE ONE TABLET BY MOUTH THREE TIMES DAILY AS NEEDED 270 tablet 0  . lisinopril (PRINIVIL,ZESTRIL) 5 MG tablet Take 2.5 mg by mouth daily.    . metolazone (ZAROXOLYN) 5 MG tablet Take by mouth.    . polyethylene glycol (MIRALAX / GLYCOLAX) packet Take 17 g by mouth daily.    . potassium chloride (K-DUR) 10 MEQ tablet TAKE ONE TABLET BY MOUTH ONCE DAILY 90 tablet 0  . pravastatin (PRAVACHOL) 80 MG tablet TAKE ONE TABLET BY MOUTH ONCE DAILY 90 tablet 0  . tamsulosin (FLOMAX) 0.4 MG CAPS capsule Take 0.4 mg by mouth.    . warfarin (COUMADIN) 5 MG tablet TAKE AS DIRECTED BY ANTI-COAGULATION CLINIC. 90 tablet 0   No current facility-administered medications on file prior to visit.     No Known Allergies  Past Medical History:  Diagnosis Date  . Anxiety   . Atrial fibrillation ([place])   . 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 Chronic back pain   . Chronic systolic heart failure (HCC) ~2009   EF under  25%  . CKD (chronic kidney disease) stage 3, GFR 30-59 ml/min 2009  . Gout, unspecified   . Hypertension   . Other left bundle branch block   . Thrombocytopenia ([place])     Past Surgical History:  Procedure Laterality Date  . Fracture of left humerus  2008   Rebroken 12/13  . ICD implant     St. Jude Durata; 12/27/07-CHF, afib, LBB, 3 vessel disease    No family history on file.  Social History   Social History  . Marital status: Single    Spouse name: N/A  . Number of children: 2  . Years of education: N/A   Occupational History  . 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 Smoking status: Former Smoker    Quit date: 09/25/1993  . Smokeless tobacco: Never Used     Comment: quit 15 years ago  . Alcohol use No     Comment: past use but not currently  . Drug use: No  . Sexual activity: Not on file   Other Topics Concern  . Not on file   Social History Narrative   Divorced   2 daughters   No living will   Requests daughter [name] as health care POA   Would accept resuscitation   Not sure about tube feeds   Review of Systems Gets out of house with daughter--like to [place]. Out to eat sometimes Appetite is okay Weight fairly stable Sleeps okay--still uses the oxygen at night Bowels are fine Voids well---keeps urinal at bedside for night. Still takes the tamsulosin Gout generally quiet    Objective:   Physical Exam  Constitutional: He is oriented to person, place, and time. He appears well-developed and well-nourished. No distress.  HENT:  Mouth/Throat: Oropharynx is clear and moist. No oropharyngeal exudate.  Neck: Normal range of motion. Neck supple. No thyromegaly present.  Pulmonary/Chest: Effort normal and breath sounds normal. No respiratory distress. He has no wheezes. He has no rales.  Abdominal: Soft. There is no tenderness.  Musculoskeletal:  2+ reedy edema with stasis changes and some weeping in both legs  Lymphadenopathy:    He has no cervical adenopathy.  Neurological: He is alert and oriented to person, place, and time.  Slow--but knows month and year President-- "Trump, Obama, ?" [id number] D-l-r-o-w Recall 3/3  Skin:  No leg ulcers  Psychiatric: He has a normal mood and affect. His behavior is normal.  Some psychomotor retardation--but doesn't seem depressed          Assessment & Plan:

## 2016-04-27 NOTE — Addendum Note (Signed)
Addended by: Pilar Grammes on: 04/27/2016 04:45 PM   Modules accepted: Orders

## 2016-04-28 ENCOUNTER — Other Ambulatory Visit: Payer: Self-pay | Admitting: Internal Medicine

## 2016-05-02 DIAGNOSIS — M109 Gout, unspecified: Secondary | ICD-10-CM | POA: Diagnosis not present

## 2016-05-02 DIAGNOSIS — I5022 Chronic systolic (congestive) heart failure: Secondary | ICD-10-CM | POA: Diagnosis not present

## 2016-05-02 DIAGNOSIS — N2581 Secondary hyperparathyroidism of renal origin: Secondary | ICD-10-CM | POA: Diagnosis not present

## 2016-05-02 DIAGNOSIS — N184 Chronic kidney disease, stage 4 (severe): Secondary | ICD-10-CM | POA: Diagnosis not present

## 2016-05-02 LAB — PROTIME-INR

## 2016-05-03 ENCOUNTER — Encounter: Payer: Self-pay | Admitting: Internal Medicine

## 2016-05-04 ENCOUNTER — Other Ambulatory Visit: Payer: Self-pay | Admitting: Internal Medicine

## 2016-05-09 LAB — PROTIME-INR

## 2016-05-10 ENCOUNTER — Encounter: Payer: Self-pay | Admitting: Internal Medicine

## 2016-05-10 ENCOUNTER — Telehealth: Payer: Self-pay

## 2016-05-10 NOTE — Telephone Encounter (Signed)
Called patient. No answer. Will try later.  

## 2016-05-10 NOTE — Telephone Encounter (Signed)
-----   Message from Venia Carbon, MD sent at 05/10/2016  1:54 PM EDT ----- Please call daughter Protime fine Same dose Recheck next week per routine

## 2016-05-13 ENCOUNTER — Other Ambulatory Visit: Payer: Self-pay | Admitting: Internal Medicine

## 2016-05-15 ENCOUNTER — Telehealth: Payer: Self-pay | Admitting: Internal Medicine

## 2016-05-15 NOTE — Telephone Encounter (Signed)
Approved: #[id number] x 0 

## 2016-05-15 NOTE — Telephone Encounter (Signed)
What refill? Check on this tomorrow

## 2016-05-15 NOTE — Telephone Encounter (Signed)
Last filled 04-11-16 #90 Last OV 04-27-16 Next OV 10-30-16

## 2016-05-15 NOTE — Telephone Encounter (Signed)
I called in a refill of alprazolam earlier today. Left it on VM. I tried to call but number was busy. Will try tomorrow.

## 2016-05-15 NOTE — Telephone Encounter (Signed)
Left refill on voice mail at pharmacy  

## 2016-05-15 NOTE — Telephone Encounter (Signed)
Tiffany at the [facility] in [place] called about this pts refill.  She can be reached at [phone].  Please call her.

## 2016-05-16 ENCOUNTER — Other Ambulatory Visit: Payer: Self-pay | Admitting: Internal Medicine

## 2016-05-16 LAB — PROTIME-INR

## 2016-05-16 NOTE — Telephone Encounter (Signed)
Sent denial back to pharmacy stating we called it in on vm yesterday

## 2016-05-17 ENCOUNTER — Encounter: Payer: Self-pay | Admitting: Internal Medicine

## 2016-05-18 DIAGNOSIS — I5022 Chronic systolic (congestive) heart failure: Secondary | ICD-10-CM | POA: Diagnosis not present

## 2016-05-18 DIAGNOSIS — Z950 Presence of cardiac pacemaker: Secondary | ICD-10-CM | POA: Diagnosis not present

## 2016-05-18 DIAGNOSIS — I482 Chronic atrial fibrillation: Secondary | ICD-10-CM | POA: Diagnosis not present

## 2016-05-18 DIAGNOSIS — I442 Atrioventricular block, complete: Secondary | ICD-10-CM | POA: Diagnosis not present

## 2016-05-23 DIAGNOSIS — I4891 Unspecified atrial fibrillation: Secondary | ICD-10-CM | POA: Diagnosis not present

## 2016-05-23 LAB — PROTIME-INR

## 2016-05-24 ENCOUNTER — Encounter: Payer: Self-pay | Admitting: Internal Medicine

## 2016-05-26 ENCOUNTER — Other Ambulatory Visit: Payer: Self-pay

## 2016-05-31 ENCOUNTER — Encounter: Payer: Self-pay | Admitting: Internal Medicine

## 2016-05-31 LAB — PROTIME-INR

## 2016-06-05 ENCOUNTER — Ambulatory Visit
Admission: RE | Admit: 2016-06-05 | Discharge: 2016-06-05 | Disposition: A | Discharge status: Home / Self Care | Payer: Medicare Other | Source: Ambulatory Visit | Attending: Nephrology | Admitting: Nephrology

## 2016-06-05 DIAGNOSIS — N184 Chronic kidney disease, stage 4 (severe): Secondary | ICD-10-CM | POA: Diagnosis not present

## 2016-06-05 DIAGNOSIS — R609 Edema, unspecified: Secondary | ICD-10-CM | POA: Diagnosis not present

## 2016-06-05 MED ORDER — FUROSEMIDE 10 MG/ML IJ SOLN
60.0000 mg | Freq: Once | INTRAMUSCULAR | Status: AC
  Administered 2016-06-05: 60 mg via INTRAVENOUS

## 2016-06-05 MED ORDER — SODIUM CHLORIDE FLUSH 0.9 % IV SOLN
INTRAVENOUS | Status: AC
  Filled 2016-06-05: qty 20

## 2016-06-05 MED ORDER — FUROSEMIDE 10 MG/ML IJ SOLN
INTRAMUSCULAR | Status: AC
  Administered 2016-06-05: 60 mg via INTRAVENOUS
  Filled 2016-06-05: qty 4

## 2016-06-05 MED ORDER — FUROSEMIDE 10 MG/ML IJ SOLN
INTRAMUSCULAR | Status: AC
  Filled 2016-06-05: qty 2

## 2016-06-06 ENCOUNTER — Ambulatory Visit
Admission: RE | Admit: 2016-06-06 | Discharge: 2016-06-06 | Disposition: A | Discharge status: Home / Self Care | Payer: Medicare Other | Source: Ambulatory Visit | Attending: Nephrology | Admitting: Nephrology

## 2016-06-06 DIAGNOSIS — R609 Edema, unspecified: Secondary | ICD-10-CM | POA: Insufficient documentation

## 2016-06-06 DIAGNOSIS — N184 Chronic kidney disease, stage 4 (severe): Secondary | ICD-10-CM | POA: Insufficient documentation

## 2016-06-06 MED ORDER — SODIUM CHLORIDE FLUSH 0.9 % IV SOLN
INTRAVENOUS | Status: AC
  Administered 2016-06-06: 10 mL
  Filled 2016-06-06: qty 10

## 2016-06-06 MED ORDER — FUROSEMIDE 10 MG/ML IJ SOLN
INTRAMUSCULAR | Status: AC
  Administered 2016-06-06: 60 mg via INTRAVENOUS
  Filled 2016-06-06: qty 2

## 2016-06-06 MED ORDER — FUROSEMIDE 10 MG/ML IJ SOLN
INTRAMUSCULAR | Status: AC
  Filled 2016-06-06: qty 4

## 2016-06-06 MED ORDER — FUROSEMIDE 10 MG/ML IJ SOLN
60.0000 mg | Freq: Once | INTRAMUSCULAR | Status: AC
  Administered 2016-06-06: 60 mg via INTRAVENOUS

## 2016-06-06 MED ORDER — SODIUM CHLORIDE FLUSH 0.9 % IV SOLN
INTRAVENOUS | Status: AC
  Filled 2016-06-06: qty 10

## 2016-06-07 ENCOUNTER — Ambulatory Visit
Admission: RE | Admit: 2016-06-07 | Discharge: 2016-06-07 | Disposition: A | Discharge status: Home / Self Care | Payer: Medicare Other | Source: Ambulatory Visit | Attending: Nephrology | Admitting: Nephrology

## 2016-06-07 DIAGNOSIS — R609 Edema, unspecified: Secondary | ICD-10-CM | POA: Insufficient documentation

## 2016-06-07 DIAGNOSIS — N184 Chronic kidney disease, stage 4 (severe): Secondary | ICD-10-CM | POA: Insufficient documentation

## 2016-06-07 LAB — PROTIME-INR

## 2016-06-07 MED ORDER — FUROSEMIDE 10 MG/ML IJ SOLN
60.0000 mg | Freq: Once | INTRAMUSCULAR | Status: AC
  Administered 2016-06-07: 60 mg via INTRAVENOUS

## 2016-06-12 ENCOUNTER — Encounter: Payer: Self-pay | Admitting: Internal Medicine

## 2016-06-13 ENCOUNTER — Other Ambulatory Visit: Payer: Self-pay | Admitting: Internal Medicine

## 2016-06-13 ENCOUNTER — Encounter: Payer: Self-pay | Admitting: Internal Medicine

## 2016-06-13 LAB — PROTIME-INR

## 2016-06-13 NOTE — Telephone Encounter (Signed)
Last Filled 05-16-16 #90 Last OV 04-27-16 No Future OV

## 2016-06-13 NOTE — Telephone Encounter (Signed)
Left refill on voice mail at pharmacy  

## 2016-06-13 NOTE — Telephone Encounter (Signed)
Approved: #[id number] x 0 

## 2016-06-20 DIAGNOSIS — I4891 Unspecified atrial fibrillation: Secondary | ICD-10-CM | POA: Diagnosis not present

## 2016-06-20 LAB — PROTIME-INR

## 2016-06-21 ENCOUNTER — Encounter: Payer: Self-pay | Admitting: Internal Medicine

## 2016-06-24 ENCOUNTER — Other Ambulatory Visit: Payer: Self-pay | Admitting: Internal Medicine

## 2016-06-27 LAB — PROTIME-INR

## 2016-06-28 ENCOUNTER — Encounter: Payer: Self-pay | Admitting: Internal Medicine

## 2016-07-03 ENCOUNTER — Other Ambulatory Visit: Payer: Self-pay | Admitting: Internal Medicine

## 2016-07-03 MED ORDER — HYDROCODONE-ACETAMINOPHEN 10-325 MG PO TABS: 1.0000 | ORAL_TABLET | Freq: Four times a day (QID) | ORAL | 0 refills | Status: DC | PRN

## 2016-07-03 NOTE — Telephone Encounter (Signed)
Printed and in Kim's box 

## 2016-07-03 NOTE — Telephone Encounter (Signed)
Hydrocodone last filled 04-27-16 #120 Last OV 04-27-16 Next OV 10-30-16  Forward to Dr Danise Mina in Dr Alla German absence. Please send back to [facility] with approval/denial

## 2016-07-03 NOTE — Telephone Encounter (Signed)
pts daughter called to request a refill of the pts hydrocodone.  Can you please call it in for him.

## 2016-07-04 NOTE — Telephone Encounter (Signed)
Spoke to Micheal [name] per DPR. RX up front ready for pickup

## 2016-07-05 ENCOUNTER — Encounter: Payer: Self-pay | Admitting: Internal Medicine

## 2016-07-05 ENCOUNTER — Inpatient Hospital Stay
Admission: EM | Admit: 2016-07-05 | Discharge: 2016-07-17 | DRG: 480 | Disposition: A | Discharge status: Skilled Nursing Facility | Payer: Medicare Other | Attending: Internal Medicine | Admitting: Internal Medicine

## 2016-07-05 ENCOUNTER — Encounter: Payer: Self-pay | Admitting: Emergency Medicine

## 2016-07-05 ENCOUNTER — Emergency Department: Payer: Medicare Other

## 2016-07-05 DIAGNOSIS — N400 Enlarged prostate without lower urinary tract symptoms: Secondary | ICD-10-CM | POA: Diagnosis present

## 2016-07-05 DIAGNOSIS — N179 Acute kidney failure, unspecified: Secondary | ICD-10-CM | POA: Diagnosis present

## 2016-07-05 DIAGNOSIS — M109 Gout, unspecified: Secondary | ICD-10-CM | POA: Diagnosis present

## 2016-07-05 DIAGNOSIS — I9581 Postprocedural hypotension: Secondary | ICD-10-CM | POA: Diagnosis not present

## 2016-07-05 DIAGNOSIS — I129 Hypertensive chronic kidney disease with stage 1 through stage 4 chronic kidney disease, or unspecified chronic kidney disease: Secondary | ICD-10-CM | POA: Diagnosis present

## 2016-07-05 DIAGNOSIS — Z419 Encounter for procedure for purposes other than remedying health state, unspecified: Secondary | ICD-10-CM

## 2016-07-05 DIAGNOSIS — G9341 Metabolic encephalopathy: Secondary | ICD-10-CM | POA: Diagnosis not present

## 2016-07-05 DIAGNOSIS — N390 Urinary tract infection, site not specified: Secondary | ICD-10-CM | POA: Diagnosis present

## 2016-07-05 DIAGNOSIS — H5702 Anisocoria: Secondary | ICD-10-CM

## 2016-07-05 DIAGNOSIS — M6281 Muscle weakness (generalized): Secondary | ICD-10-CM | POA: Diagnosis not present

## 2016-07-05 DIAGNOSIS — I5023 Acute on chronic systolic (congestive) heart failure: Secondary | ICD-10-CM | POA: Diagnosis not present

## 2016-07-05 DIAGNOSIS — D62 Acute posthemorrhagic anemia: Secondary | ICD-10-CM | POA: Diagnosis not present

## 2016-07-05 DIAGNOSIS — S0990XA Unspecified injury of head, initial encounter: Secondary | ICD-10-CM | POA: Diagnosis not present

## 2016-07-05 DIAGNOSIS — M25552 Pain in left hip: Secondary | ICD-10-CM | POA: Diagnosis not present

## 2016-07-05 DIAGNOSIS — I252 Old myocardial infarction: Secondary | ICD-10-CM

## 2016-07-05 DIAGNOSIS — N2581 Secondary hyperparathyroidism of renal origin: Secondary | ICD-10-CM | POA: Diagnosis present

## 2016-07-05 DIAGNOSIS — Z6837 Body mass index (BMI) 37.0-37.9, adult: Secondary | ICD-10-CM | POA: Diagnosis not present

## 2016-07-05 DIAGNOSIS — N183 Chronic kidney disease, stage 3 (moderate): Secondary | ICD-10-CM | POA: Diagnosis not present

## 2016-07-05 DIAGNOSIS — W1839XA Other fall on same level, initial encounter: Secondary | ICD-10-CM | POA: Diagnosis present

## 2016-07-05 DIAGNOSIS — I2510 Atherosclerotic heart disease of native coronary artery without angina pectoris: Secondary | ICD-10-CM | POA: Diagnosis present

## 2016-07-05 DIAGNOSIS — Z7901 Long term (current) use of anticoagulants: Secondary | ICD-10-CM | POA: Diagnosis not present

## 2016-07-05 DIAGNOSIS — J9611 Chronic respiratory failure with hypoxia: Secondary | ICD-10-CM | POA: Diagnosis not present

## 2016-07-05 DIAGNOSIS — Z1624 Resistance to multiple antibiotics: Secondary | ICD-10-CM | POA: Diagnosis present

## 2016-07-05 DIAGNOSIS — I447 Left bundle-branch block, unspecified: Secondary | ICD-10-CM | POA: Diagnosis present

## 2016-07-05 DIAGNOSIS — M79605 Pain in left leg: Secondary | ICD-10-CM | POA: Diagnosis not present

## 2016-07-05 DIAGNOSIS — R0602 Shortness of breath: Secondary | ICD-10-CM | POA: Diagnosis not present

## 2016-07-05 DIAGNOSIS — S299XXA Unspecified injury of thorax, initial encounter: Secondary | ICD-10-CM | POA: Diagnosis not present

## 2016-07-05 DIAGNOSIS — I5032 Chronic diastolic (congestive) heart failure: Secondary | ICD-10-CM | POA: Diagnosis not present

## 2016-07-05 DIAGNOSIS — M549 Dorsalgia, unspecified: Secondary | ICD-10-CM | POA: Diagnosis present

## 2016-07-05 DIAGNOSIS — Z01810 Encounter for preprocedural cardiovascular examination: Secondary | ICD-10-CM | POA: Diagnosis not present

## 2016-07-05 DIAGNOSIS — R570 Cardiogenic shock: Secondary | ICD-10-CM | POA: Diagnosis not present

## 2016-07-05 DIAGNOSIS — C709 Malignant neoplasm of meninges, unspecified: Secondary | ICD-10-CM | POA: Diagnosis not present

## 2016-07-05 DIAGNOSIS — I509 Heart failure, unspecified: Secondary | ICD-10-CM | POA: Diagnosis not present

## 2016-07-05 DIAGNOSIS — I429 Cardiomyopathy, unspecified: Secondary | ICD-10-CM | POA: Diagnosis present

## 2016-07-05 DIAGNOSIS — G8929 Other chronic pain: Secondary | ICD-10-CM | POA: Diagnosis present

## 2016-07-05 DIAGNOSIS — Z471 Aftercare following joint replacement surgery: Secondary | ICD-10-CM | POA: Diagnosis not present

## 2016-07-05 DIAGNOSIS — I482 Chronic atrial fibrillation: Secondary | ICD-10-CM | POA: Diagnosis present

## 2016-07-05 DIAGNOSIS — T8242XA Displacement of vascular dialysis catheter, initial encounter: Secondary | ICD-10-CM | POA: Diagnosis not present

## 2016-07-05 DIAGNOSIS — Z9989 Dependence on other enabling machines and devices: Secondary | ICD-10-CM | POA: Diagnosis not present

## 2016-07-05 DIAGNOSIS — R0902 Hypoxemia: Secondary | ICD-10-CM

## 2016-07-05 DIAGNOSIS — E1122 Type 2 diabetes mellitus with diabetic chronic kidney disease: Secondary | ICD-10-CM | POA: Diagnosis present

## 2016-07-05 DIAGNOSIS — N184 Chronic kidney disease, stage 4 (severe): Secondary | ICD-10-CM | POA: Diagnosis present

## 2016-07-05 DIAGNOSIS — S72142A Displaced intertrochanteric fracture of left femur, initial encounter for closed fracture: Secondary | ICD-10-CM | POA: Diagnosis not present

## 2016-07-05 DIAGNOSIS — Z96642 Presence of left artificial hip joint: Secondary | ICD-10-CM | POA: Diagnosis not present

## 2016-07-05 DIAGNOSIS — Z87891 Personal history of nicotine dependence: Secondary | ICD-10-CM

## 2016-07-05 DIAGNOSIS — T82858A Stenosis of vascular prosthetic devices, implants and grafts, initial encounter: Secondary | ICD-10-CM | POA: Diagnosis not present

## 2016-07-05 DIAGNOSIS — W19XXXA Unspecified fall, initial encounter: Secondary | ICD-10-CM

## 2016-07-05 DIAGNOSIS — D631 Anemia in chronic kidney disease: Secondary | ICD-10-CM | POA: Diagnosis not present

## 2016-07-05 DIAGNOSIS — E785 Hyperlipidemia, unspecified: Secondary | ICD-10-CM | POA: Diagnosis present

## 2016-07-05 DIAGNOSIS — R809 Proteinuria, unspecified: Secondary | ICD-10-CM | POA: Diagnosis not present

## 2016-07-05 DIAGNOSIS — J9610 Chronic respiratory failure, unspecified whether with hypoxia or hypercapnia: Secondary | ICD-10-CM | POA: Diagnosis present

## 2016-07-05 DIAGNOSIS — D329 Benign neoplasm of meninges, unspecified: Secondary | ICD-10-CM

## 2016-07-05 DIAGNOSIS — Z8249 Family history of ischemic heart disease and other diseases of the circulatory system: Secondary | ICD-10-CM

## 2016-07-05 DIAGNOSIS — N186 End stage renal disease: Secondary | ICD-10-CM | POA: Diagnosis not present

## 2016-07-05 DIAGNOSIS — M25652 Stiffness of left hip, not elsewhere classified: Secondary | ICD-10-CM

## 2016-07-05 DIAGNOSIS — S72002A Fracture of unspecified part of neck of left femur, initial encounter for closed fracture: Secondary | ICD-10-CM | POA: Diagnosis not present

## 2016-07-05 DIAGNOSIS — M7989 Other specified soft tissue disorders: Secondary | ICD-10-CM | POA: Diagnosis not present

## 2016-07-05 DIAGNOSIS — J811 Chronic pulmonary edema: Secondary | ICD-10-CM | POA: Diagnosis not present

## 2016-07-05 DIAGNOSIS — F419 Anxiety disorder, unspecified: Secondary | ICD-10-CM | POA: Diagnosis present

## 2016-07-05 DIAGNOSIS — R296 Repeated falls: Secondary | ICD-10-CM | POA: Diagnosis not present

## 2016-07-05 DIAGNOSIS — R600 Localized edema: Secondary | ICD-10-CM | POA: Diagnosis not present

## 2016-07-05 DIAGNOSIS — N189 Chronic kidney disease, unspecified: Secondary | ICD-10-CM | POA: Diagnosis not present

## 2016-07-05 DIAGNOSIS — R262 Difficulty in walking, not elsewhere classified: Secondary | ICD-10-CM | POA: Diagnosis not present

## 2016-07-05 DIAGNOSIS — I4891 Unspecified atrial fibrillation: Secondary | ICD-10-CM | POA: Diagnosis not present

## 2016-07-05 DIAGNOSIS — Z79899 Other long term (current) drug therapy: Secondary | ICD-10-CM

## 2016-07-05 DIAGNOSIS — S72009A Fracture of unspecified part of neck of unspecified femur, initial encounter for closed fracture: Secondary | ICD-10-CM | POA: Diagnosis not present

## 2016-07-05 DIAGNOSIS — R319 Hematuria, unspecified: Secondary | ICD-10-CM | POA: Diagnosis not present

## 2016-07-05 DIAGNOSIS — Z833 Family history of diabetes mellitus: Secondary | ICD-10-CM

## 2016-07-05 DIAGNOSIS — Z95810 Presence of automatic (implantable) cardiac defibrillator: Secondary | ICD-10-CM

## 2016-07-05 DIAGNOSIS — I480 Paroxysmal atrial fibrillation: Secondary | ICD-10-CM | POA: Diagnosis not present

## 2016-07-05 DIAGNOSIS — R930 Abnormal findings on diagnostic imaging of skull and head, not elsewhere classified: Secondary | ICD-10-CM | POA: Diagnosis not present

## 2016-07-05 DIAGNOSIS — Z9181 History of falling: Secondary | ICD-10-CM

## 2016-07-05 HISTORY — DX: Dyspnea, unspecified: R06.00

## 2016-07-05 HISTORY — DX: Unspecified fracture of shaft of humerus, unspecified arm, initial encounter for closed fracture: S42.309A

## 2016-07-05 HISTORY — DX: Atherosclerotic heart disease of native coronary artery without angina pectoris: I25.10

## 2016-07-05 HISTORY — DX: Acute myocardial infarction, unspecified: I21.9

## 2016-07-05 HISTORY — DX: Heart failure, unspecified: I50.9

## 2016-07-05 LAB — URINALYSIS COMPLETE WITH MICROSCOPIC (ARMC ONLY)
BACTERIA UA: NONE SEEN
Bilirubin Urine: NEGATIVE
GLUCOSE, UA: 150 mg/dL — AB
Ketones, ur: NEGATIVE mg/dL
NITRITE: NEGATIVE
PROTEIN: 30 mg/dL — AB
Specific Gravity, Urine: 1.009 (ref 1.005–1.030)
pH: 7 (ref 5.0–8.0)

## 2016-07-05 LAB — CBC WITH DIFFERENTIAL/PLATELET
Basophils Absolute: 0 10*3/uL (ref 0–0.1)
Basophils Relative: 0 %
EOS ABS: 0 10*3/uL (ref 0–0.7)
Eosinophils Relative: 0 %
HEMATOCRIT: 35.2 % — AB (ref 40.0–52.0)
HEMOGLOBIN: 12 g/dL — AB (ref 13.0–18.0)
LYMPHS ABS: 0.5 10*3/uL — AB (ref 1.0–3.6)
Lymphocytes Relative: 5 %
MCH: 30.4 pg (ref 26.0–34.0)
MCHC: 34.2 g/dL (ref 32.0–36.0)
MCV: 89 fL (ref 80.0–100.0)
MONOS PCT: 5 %
Monocytes Absolute: 0.5 10*3/uL (ref 0.2–1.0)
NEUTROS PCT: 90 %
Neutro Abs: 8.7 10*3/uL — ABNORMAL HIGH (ref 1.4–6.5)
Platelets: 159 10*3/uL (ref 150–440)
RBC: 3.95 MIL/uL — ABNORMAL LOW (ref 4.40–5.90)
RDW: 14.1 % (ref 11.5–14.5)
WBC: 9.8 10*3/uL (ref 3.8–10.6)

## 2016-07-05 LAB — COMPREHENSIVE METABOLIC PANEL
ALK PHOS: 58 U/L (ref 38–126)
ALT: 15 U/L — ABNORMAL LOW (ref 17–63)
ANION GAP: 12 (ref 5–15)
AST: 19 U/L (ref 15–41)
Albumin: 4.2 g/dL (ref 3.5–5.0)
BILIRUBIN TOTAL: 0.7 mg/dL (ref 0.3–1.2)
BUN: 86 mg/dL — ABNORMAL HIGH (ref 6–20)
CALCIUM: 9.9 mg/dL (ref 8.9–10.3)
CO2: 30 mmol/L (ref 22–32)
Chloride: 94 mmol/L — ABNORMAL LOW (ref 101–111)
Creatinine, Ser: 3.62 mg/dL — ABNORMAL HIGH (ref 0.61–1.24)
GFR, EST AFRICAN AMERICAN: 18 mL/min — AB (ref >60)
GFR, EST NON AFRICAN AMERICAN: 15 mL/min — AB (ref >60)
Glucose, Bld: 165 mg/dL — ABNORMAL HIGH (ref 65–99)
Potassium: 3.9 mmol/L (ref 3.5–5.1)
SODIUM: 136 mmol/L (ref 135–145)
TOTAL PROTEIN: 7.8 g/dL (ref 6.5–8.1)

## 2016-07-05 LAB — SURGICAL PCR SCREEN
MRSA, PCR: POSITIVE — AB
Staphylococcus aureus: POSITIVE — AB

## 2016-07-05 LAB — PROTIME-INR
INR: 2.01
INR: 2 — AB (ref 0.9–1.1)
PROTHROMBIN TIME: 23.1 s — AB (ref 11.4–15.2)

## 2016-07-05 LAB — CK: CK TOTAL: 140 U/L (ref 49–397)

## 2016-07-05 MED ORDER — SODIUM CHLORIDE 0.9 % IV SOLN
INTRAVENOUS | Status: AC
  Administered 2016-07-05 (×2): via INTRAVENOUS

## 2016-07-05 MED ORDER — ACETAMINOPHEN 650 MG RE SUPP: 650.0000 mg | Freq: Four times a day (QID) | RECTAL | Status: DC | PRN

## 2016-07-05 MED ORDER — CARVEDILOL 3.125 MG PO TABS
3.1250 mg | ORAL_TABLET | Freq: Two times a day (BID) | ORAL | Status: DC
  Administered 2016-07-05: 3.125 mg via ORAL
  Filled 2016-07-05 (×2): qty 1

## 2016-07-05 MED ORDER — POLYETHYLENE GLYCOL 3350 17 G PO PACK
17.0000 g | PACK | Freq: Every day | ORAL | Status: DC
  Administered 2016-07-05 – 2016-07-14 (×8): 17 g via ORAL
  Filled 2016-07-05 (×8): qty 1

## 2016-07-05 MED ORDER — OXYCODONE HCL ER 15 MG PO T12A
15.0000 mg | EXTENDED_RELEASE_TABLET | Freq: Two times a day (BID) | ORAL | Status: DC
  Administered 2016-07-05 – 2016-07-06 (×4): 15 mg via ORAL
  Filled 2016-07-05 (×5): qty 1

## 2016-07-05 MED ORDER — PHYTONADIONE 5 MG PO TABS
5.0000 mg | ORAL_TABLET | Freq: Once | ORAL | Status: AC
  Administered 2016-07-05: 5 mg via ORAL
  Filled 2016-07-05: qty 1

## 2016-07-05 MED ORDER — TAMSULOSIN HCL 0.4 MG PO CAPS
0.4000 mg | ORAL_CAPSULE | Freq: Every day | ORAL | Status: DC
  Administered 2016-07-05 – 2016-07-16 (×13): 0.4 mg via ORAL
  Filled 2016-07-05 (×14): qty 1

## 2016-07-05 MED ORDER — CITALOPRAM HYDROBROMIDE 20 MG PO TABS
40.0000 mg | ORAL_TABLET | Freq: Every day | ORAL | Status: DC
  Administered 2016-07-05 – 2016-07-17 (×12): 40 mg via ORAL
  Filled 2016-07-05 (×12): qty 2

## 2016-07-05 MED ORDER — ALPRAZOLAM 0.5 MG PO TABS
0.5000 mg | ORAL_TABLET | Freq: Three times a day (TID) | ORAL | Status: DC | PRN
  Administered 2016-07-05 – 2016-07-08 (×6): 0.5 mg via ORAL
  Filled 2016-07-05 (×6): qty 1

## 2016-07-05 MED ORDER — ONDANSETRON HCL 4 MG PO TABS: 4.0000 mg | ORAL_TABLET | Freq: Four times a day (QID) | ORAL | Status: DC | PRN

## 2016-07-05 MED ORDER — CALCITRIOL 0.25 MCG PO CAPS
0.2500 ug | ORAL_CAPSULE | Freq: Every day | ORAL | Status: DC
  Administered 2016-07-05 – 2016-07-17 (×11): 0.25 ug via ORAL
  Filled 2016-07-05 (×11): qty 1

## 2016-07-05 MED ORDER — ONDANSETRON HCL 4 MG/2ML IJ SOLN
4.0000 mg | Freq: Four times a day (QID) | INTRAMUSCULAR | Status: DC | PRN
  Administered 2016-07-06 (×2): 4 mg via INTRAVENOUS
  Filled 2016-07-05 (×2): qty 2

## 2016-07-05 MED ORDER — PRAVASTATIN SODIUM 40 MG PO TABS
80.0000 mg | ORAL_TABLET | Freq: Every day | ORAL | Status: DC
  Administered 2016-07-05 – 2016-07-16 (×11): 80 mg via ORAL
  Filled 2016-07-05 (×11): qty 2

## 2016-07-05 MED ORDER — CHLORHEXIDINE GLUCONATE CLOTH 2 % EX PADS
6.0000 | MEDICATED_PAD | Freq: Every day | CUTANEOUS | Status: AC
  Administered 2016-07-07 – 2016-07-10 (×3): 6 via TOPICAL

## 2016-07-05 MED ORDER — CYCLOBENZAPRINE HCL 10 MG PO TABS
5.0000 mg | ORAL_TABLET | Freq: Two times a day (BID) | ORAL | Status: DC | PRN
  Administered 2016-07-06: 5 mg via ORAL
  Filled 2016-07-05: qty 1

## 2016-07-05 MED ORDER — ACETAMINOPHEN 325 MG PO TABS
650.0000 mg | ORAL_TABLET | Freq: Four times a day (QID) | ORAL | Status: DC | PRN
  Administered 2016-07-07: 650 mg via ORAL
  Filled 2016-07-05: qty 2

## 2016-07-05 MED ORDER — HYDROCODONE-ACETAMINOPHEN 10-325 MG PO TABS
1.0000 | ORAL_TABLET | Freq: Four times a day (QID) | ORAL | Status: DC | PRN
  Administered 2016-07-05 – 2016-07-07 (×4): 1 via ORAL
  Filled 2016-07-05 (×4): qty 1

## 2016-07-05 MED ORDER — MUPIROCIN 2 % EX OINT
1.0000 "application " | TOPICAL_OINTMENT | Freq: Two times a day (BID) | CUTANEOUS | Status: AC
  Administered 2016-07-05 – 2016-07-10 (×9): 1 via NASAL
  Filled 2016-07-05 (×2): qty 22

## 2016-07-05 MED ORDER — CEFAZOLIN SODIUM-DEXTROSE 2-4 GM/100ML-% IV SOLN
2.0000 g | Freq: Once | INTRAVENOUS | Status: AC
  Administered 2016-07-07: 2 g via INTRAVENOUS
  Filled 2016-07-05: qty 100

## 2016-07-05 MED ORDER — MORPHINE SULFATE (PF) 2 MG/ML IV SOLN
2.0000 mg | INTRAVENOUS | Status: DC | PRN
  Administered 2016-07-05: 2 mg via INTRAVENOUS
  Filled 2016-07-05: qty 1

## 2016-07-05 MED ORDER — FEBUXOSTAT 40 MG PO TABS
80.0000 mg | ORAL_TABLET | Freq: Every day | ORAL | Status: DC
  Administered 2016-07-06: 80 mg via ORAL
  Filled 2016-07-05 (×3): qty 2

## 2016-07-05 MED ORDER — HYDROMORPHONE HCL 1 MG/ML IJ SOLN
1.0000 mg | INTRAMUSCULAR | Status: DC | PRN
  Administered 2016-07-05 – 2016-07-06 (×6): 1 mg via INTRAVENOUS
  Filled 2016-07-05 (×7): qty 1

## 2016-07-05 NOTE — Consult Note (Signed)
Patient is seen after being admitted with a left comminuted intratrochanteric hip fracture. He had a mechanical fall at home and was brought to the emergency room after being unable to get up. His left leg is obvious deformity and x-rays confirmed comminuted proximal femur fracture. He is a household ambulator with significant coronary problems. He has some chronic problems with edema in the feet as well. On examination the left leg is shortened externally rotated and the knee is flexed. He has peeling skin around the foot but no open sores are present. He is able flex extend the toes. He has significant edema in the lower extremity. It is intact at the hip.  X-rays show comminuted unstable fracture pattern with lesser trochanter broken off. Minimal hip osteoarthritis present.  Impression is displaced comminuted unstable left intertrochanteric hip fracture  Plan: ORIF when ProTime is down some to minimize postoperative bleeding problems and complications. I have adjusted his pain medication as he chronically is on hydrocodone and has some tolerance.

## 2016-07-05 NOTE — Clinical Social Work Note (Signed)
Clinical Social Work Assessment  Patient Details  Name: Micheal [name] MRN: [id number] Date of Birth: December 02, 1940  Date of referral:  07/05/16               Reason for consult:  Facility Placement                Permission sought to share information with:  Chartered certified accountant granted to share information::  Yes, Verbal Permission Granted  Name::      [place]::   [place]   Relationship::     Contact Information:     Housing/Transportation Living arrangements for the past 2 months:  [place] of Information:  Patient, Adult Children Patient Interpreter Needed:  None Criminal Activity/Legal Involvement Pertinent to Current Situation/Hospitalization:  No - Comment as needed Significant Relationships:  Adult Children Lives with:  Self Do you feel safe going back to the place where you live?  Yes Need for family participation in patient care:  Yes (Comment)  Care giving concerns:  Patient lives alone in [place].    Social Worker assessment / plan:  Holiday representative (CSW) reviewed chart and noted that patient is having surgery for a hip fracture. CSW met with patient and his 2 adult daughters Micheal [name]/ [facility] and [place]. CSW introduced self and explained role of CSW department. Patient was alert and oriented and laying in the bed. Per patient he lives alone in [place]. Daughter Micheal [name] reported that she takes care of patient by assisting with medications and meals. Per daughter she lives in [place] and works close by [employer]. CSW explained that PT will evaluate patient after surgery and make a recommendation of home health or SNF. Patient and daughters prefer SNF. Per Micheal [name] patient has been to [facility] in the past and his PCP is Dr. Silvio Pate, Market researcher at [facility]. CSW explained that patient will require a 3 night inpatient qualifying stay at [facility] in order for Medicare to pay for SNF. Daughters verbalized  their understanding.   FL2 complete and faxed out. CSW will continue to follow and assist as needed.   Employment status:  Retired Forensic scientist:  Medicare PT Recommendations:  Not assessed at this time [place] / Referral to community resources:  [place]  Patient/Family's Response to care:  Patient and daughters are agreeable to [employer] and prefer [employer].   Patient/Family's Understanding of and Emotional Response to Diagnosis, Current Treatment, and Prognosis:  Patient and daughters were pleasant and thanked CSW for visit.   Emotional Assessment Appearance:  Appears stated age Attitude/Demeanor/Rapport:    Affect (typically observed):  Accepting, Adaptable, Pleasant Orientation:  Oriented to Self, Oriented to Place, Oriented to  Time, Oriented to Situation Alcohol / Substance use:  Not Applicable Psych involvement (Current and /or in the community):  No (Comment)  Discharge Needs  Concerns to be addressed:  Discharge Planning Concerns Readmission within the last 30 days:  No Current discharge risk:  Dependent with Mobility Barriers to Discharge:  Continued Medical Work up   [employer], [place] Beets, LCSW 07/05/2016, 2:10 PM

## 2016-07-05 NOTE — H&P (Signed)
[place] [facility] [place] NAME: Micheal [name]    MR#:  [id number]  DATE OF BIRTH:  April 14, 1941  DATE OF ADMISSION:  07/05/2016  PRIMARY CARE PHYSICIAN: [name], MD   REQUESTING/REFERRING PHYSICIAN: Dr. Merlyn Lot  CHIEF COMPLAINT:   Chief Complaint  Patient presents with  . Fall  . Leg Pain    HISTORY OF PRESENT ILLNESS:  Lott Baladez  is a 75 y.o. 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 Patient is sedated from pain medication at this time. Most of the history is obtained from his daughter at bedside. Baseline he ambulates independently, though slow pace at home where he lives by himself. He has had prior falls without any hip fractures. Last night he was coming back from his recliner trying to get into his bed and had weakness in his legs. Denies any chest pain or syncopal episode. Hip x-ray showing comminuted left intertrochanteric fracture. Patient has multiple medical problems including cardiomyopathy with low ejection fraction, CK D noted to be in acute renal failure today. He is on Coumadin for his A. fib and INR is at 2. He needs to be stabilized prior to getting his surgery. He will be a moderate to high risk for surgery. Awaiting cardiology clearance as well. -Patient is able to mention that he is able to ambulate without significant chest pain. No change in his breathing in the last few weeks. He has been on high-dose Lasix and metolazone for his lower extremity edema.  PAST MEDICAL HISTORY:   Past Medical History:  Diagnosis Date  . Anxiety   . Atrial fibrillation ([place])   . 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 CAD (coronary artery disease)   . CHF (congestive heart failure) ([place])   . Chronic back pain   . Chronic systolic heart failure (HCC) ~2009   EF under 25%  . CKD (chronic kidney disease) stage 3, GFR 30-59 ml/min 2009  . Gout, unspecified   . Hypertension   . Other left bundle branch block   . Thrombocytopenia ([place])     PAST SURGICAL HISTORY:   Past Surgical History:  Procedure Laterality Date  . Fracture of left humerus  2008   Rebroken 12/13  . ICD implant     St. Jude Durata; 12/27/07-CHF, afib, LBB, 3 vessel disease    SOCIAL HISTORY:   Social History  Substance Use Topics  . Smoking status: Former Smoker    Quit date: 09/25/1993  . Smokeless tobacco: Never Used     Comment: quit 15 years ago  . Alcohol use No     Comment: past use but not currently    FAMILY HISTORY:   Family History  Problem Relation Age of Onset  . Heart failure Mother   . CAD Father   . Diabetes Mellitus II Father     DRUG ALLERGIES:  No Known Allergies  REVIEW OF SYSTEMS:   Review of Systems  Constitutional: Positive for malaise/fatigue. Negative for chills, fever and weight loss.  HENT: Negative for ear discharge, ear pain, hearing loss and nosebleeds.   Eyes: Negative for blurred vision, double vision and photophobia.  Respiratory:  Negative for cough, hemoptysis, shortness of breath and wheezing.   Cardiovascular: Positive for leg swelling. Negative for chest pain, palpitations and orthopnea.  Gastrointestinal: Negative for abdominal pain, constipation, diarrhea, heartburn, melena, nausea and vomiting.  Genitourinary: Negative for dysuria and urgency.  Musculoskeletal: Positive for falls, joint pain and myalgias. Negative for back pain and neck pain.  Skin: Negative for rash.  Neurological: Negative for dizziness, tingling, sensory change, speech change, focal weakness and headaches.  Endo/Heme/Allergies: Does not bruise/bleed easily.  Psychiatric/Behavioral: Negative  for depression.    MEDICATIONS AT HOME:   Prior to Admission medications   Medication Sig Start Date End Date Taking? Authorizing Provider  ALPRAZolam Duanne Moron) 0.5 MG tablet TAKE ONE TABLET BY MOUTH THREE TIMES DAILY AS NEEDED 06/13/16  Yes Venia Carbon, MD  calcitRIOL (ROCALTROL) 0.25 MCG capsule Take 0.25 mcg by mouth daily.   Yes Historical Provider, MD  carvedilol (COREG) 3.125 MG tablet TAKE ONE TABLET BY MOUTH TWICE DAILY WITH A MEAL 06/26/16  Yes Venia Carbon, MD  citalopram (CELEXA) 40 MG tablet TAKE ONE TABLET BY MOUTH ONCE DAILY 05/01/16  Yes Venia Carbon, MD  cyclobenzaprine (FLEXERIL) 5 MG tablet Take 1 tablet (5 mg total) by mouth 2 (two) times daily as needed for muscle spasms. 05/08/13  Yes Venia Carbon, MD  febuxostat (ULORIC) 40 MG tablet Take 2 tablets (80 mg total) by mouth daily. 01/26/14  Yes Venia Carbon, MD  furosemide (LASIX) 80 MG tablet TAKE ONE TO TWO TABLETS BY MOUTH ONCE DAILY AS NEEDED FOR SWELLING AND FLUID 01/17/16  Yes Venia Carbon, MD  HYDROcodone-acetaminophen (NORCO) 10-325 MG tablet Take 1 tablet by mouth every 6 (six) hours as needed. 07/03/16  Yes Ria Bush, MD  hydrOXYzine (ATARAX/VISTARIL) 50 MG tablet TAKE ONE TABLET BY MOUTH THREE TIMES DAILY AS NEEDED 04/24/16  Yes Venia Carbon, MD  lisinopril (PRINIVIL,ZESTRIL) 5 MG tablet Take 2.5 mg by mouth daily.   Yes Historical Provider, MD  metolazone (ZAROXOLYN) 5 MG tablet Take by mouth. 06/02/13  Yes Historical Provider, MD  polyethylene glycol (MIRALAX / GLYCOLAX) packet Take 17 g by mouth daily.   Yes Historical Provider, MD  potassium chloride (K-DUR) 10 MEQ tablet TAKE ONE TABLET BY MOUTH ONCE DAILY 03/27/16  Yes Venia Carbon, MD  pravastatin (PRAVACHOL) 80 MG tablet TAKE ONE TABLET BY MOUTH ONCE DAILY 04/24/16  Yes Venia Carbon, MD  tamsulosin (FLOMAX) 0.4 MG CAPS capsule Take 0.4 mg by mouth.   Yes Historical Provider, MD  warfarin (COUMADIN) 5 MG tablet TAKE AS DIRECTED BY   ANTI-COAGULATION  CLINIC 05/01/16  Yes Venia Carbon, MD      VITAL SIGNS:  Blood pressure 133/74, pulse 76, temperature 97.8 F (36.6 C), temperature source Oral, resp. rate 16, height 5\' 10"  (1.778 m), weight 110.2 kg (243 lb), SpO2 99 %.  PHYSICAL EXAMINATION:   Physical Exam  GENERAL:  75 y.o.-year-old patient lying in the bed with no acute distress. Dissheleved appearing. EYES: Pupils equal, round, reactive to light and accommodation. No scleral icterus. Extraocular muscles intact.  HEENT: Head atraumatic, normocephalic. Oropharynx and nasopharynx clear.  NECK:  Supple, no jugular venous distention. No thyroid enlargement, no tenderness.  LUNGS: Normal breath sounds bilaterally, no wheezing, rales,rhonchi or crepitation. No use of accessory muscles of respiration. Decreased bibasilar breath sounds. CARDIOVASCULAR: S1, S2 normal. No  rubs, or gallops. 3/6 systolic murmur in place ABDOMEN: Soft, nontender, nondistended. Bowel sounds present. No organomegaly or mass.  EXTREMITIES: Left leg is abducted and externally rotated. Intact neurovascular bundle. No cyanosis, or clubbing. 2+ lower extremity edema with chronic pigmentation changes NEUROLOGIC: Cranial nerves II through XII are intact. Muscle strength 5/5 in all extremities. Sensation intact. Gait not checked.  PSYCHIATRIC: The patient is alert and oriented x 3.  SKIN: No obvious rash, lesion, or ulcer.   LABORATORY PANEL:   CBC  Recent Labs Lab 07/05/16 0756  WBC 9.8  HGB 12.0*  HCT 35.2*  PLT 159   ------------------------------------------------------------------------------------------------------------------  Chemistries   Recent Labs Lab 07/05/16 0756  NA 136  K 3.9  CL 94*  CO2 30  GLUCOSE 165*  BUN 86*  CREATININE 3.62*  CALCIUM 9.9  AST 19  ALT 15*  ALKPHOS 58  BILITOT 0.7    ------------------------------------------------------------------------------------------------------------------  Cardiac Enzymes No results for input(s): TROPONINI in the last 168 hours. ------------------------------------------------------------------------------------------------------------------  RADIOLOGY:  Dg Chest 2 View  Result Date: 07/05/2016 CLINICAL DATA:  Fall.  Left hip pain.  On Coumadin. EXAM: CHEST  2 VIEW COMPARISON:  04/15/2013 FINDINGS: Cardiac enlargement. Internal defibrillator unchanged. Mild vascular congestion. Negative for edema or effusion. Lungs are clear without infiltrate. IMPRESSION: No active cardiopulmonary disease. Electronically Signed   By: Franchot Gallo M.D.   On: 07/05/2016 09:05   Dg Ankle 2 Views Left  Result Date: 07/05/2016 CLINICAL DATA:  Pain following fall EXAM: LEFT ANKLE - 2 VIEW COMPARISON:  None. FINDINGS: Frontal and lateral views were obtained. There is mild generalized soft tissue swelling. There is evidence of old trauma in the medial malleolar region with remodeling. There is no evident acute fracture or joint effusion. The ankle mortise appears intact. There is mild narrowing in the lateral aspect of the ankle joint with mild spurring along the anterior aspect of the tibial plafond. There are spurs arising from the posterior and inferior calcaneus. IMPRESSION: Old trauma medial malleolus. No acute fracture. Ankle mortise appears intact. Evidence of osteoarthritic change laterally. Calcaneal spurs noted. There is mild generalized soft tissue swelling. Electronically Signed   By: Lowella Grip III M.D.   On: 07/05/2016 09:06   Ct Head Wo Contrast  Result Date: 07/05/2016 CLINICAL DATA:  Head injury. On Coumadin. Evaluate for intracranial hemorrhage. EXAM: CT HEAD WITHOUT CONTRAST TECHNIQUE: Contiguous axial images were obtained from the base of the skull through the vertex without intravenous contrast. COMPARISON:  09/16/2012  FINDINGS: Brain: Moderate low density in the periventricular white matter likely related to small vessel disease. No hemorrhage, hydrocephalus, acute infarct, intra-axial, or extra-axial fluid collection. Meningioma along the left anterior falx measures 9 mm on image 16/series 2. No significant mass effect. Vascular: Bilateral intracranial carotid and left vertebral artery atherosclerosis. Skull: No significant soft tissue swelling.  No skull fracture. Sinuses/Orbits: Normal orbits and globes. Clear paranasal sinuses and mastoid air cells. Other: None IMPRESSION: 1. No acute or posttraumatic deformity identified 2. Moderate small vessel ischemic change. 3. 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 Electronically Signed   By: Abigail Miyamoto M.D.   On: 07/05/2016 08:44   Dg Hip Unilat W Or Wo Pelvis 2-3 Views Left  Result Date: 07/05/2016 CLINICAL DATA:  Pain following fall EXAM: DG HIP (WITH OR WITHOUT PELVIS) 2-3V LEFT COMPARISON:  None. FINDINGS: Frontal pelvis as well as frontal and lateral left hip images were obtained. There is a comminuted fracture of the intertrochanteric femur region on the left with varus angulation at the fracture site. There is avulsion  of the left lesser trochanter medially. No other fractures are evident. No dislocation. There is mild symmetric narrowing of both hip joints. There is extensive arterial vascular calcification in the iliac and superficial femoral arteries. Bones appear osteoporotic. IMPRESSION: Comminuted intertrochanteric femur fracture on the left with varus angulation at the fracture site and medial avulsion of the lesser trochanter. No other fractures are evident. Mild symmetric narrowing both hip joints. Bones osteoporotic. No dislocation. Extensive iliac and femoral artery atherosclerosis. Electronically Signed   By: Lowella Grip III M.D.   On: 07/05/2016 09:05    EKG:   Orders placed or  performed during the hospital encounter of 07/05/16  . ED EKG  . ED EKG    IMPRESSION AND PLAN:   Agee Serafini  is a 75 y.o. 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  #1 Left Hip fracture- secondary to fall. Orthopedics consulted. -Patient is moderate to high risk for surgery -Cardiology consult requested for cardiac clearance. INR is elevated, so hold Coumadin. -One dose of oral vitamin K and follow up INR tomorrow. Keep patient nothing by mouth after midnight for possible surgery tomorrow. -Explained risks to family and also discussed with orthopedic surgeon -Pain medication. Patient has high tolerance to pain medications as he is on chronic pain medications for his back pain  #2 ARF on CKD stage 4-is lying creatinine at 2.2. On Diuril takes at home. -Hold Lasix and metolazone. Nephrology consulted. -Gentle hydration today in follow-up. Renally adjust all medications. -Check SPEP and UPEP and PTH as calcium also elevated  #3 Chronic systolic CHF- well compensated, last known EF 25% -Chronic lower extremity edema. Hold Lasix and metolazone - Continue Coreg and statin  #4 Afib- rate controlled, on coreg- continue - hold warfarin, s/p pacemaker  #5 Gout-continue home medications  #6 BPH- on flomax  #7 DVT Prophylaxis- now INR at 2, start after the surgery   All the records are reviewed and case discussed with ED provider. Management plans discussed with the patient, family and they are in agreement.  CODE STATUS: Full code  TOTAL TIME TAKING CARE OF THIS PATIENT: 55 minutes.    Gladstone Lighter M.D on 07/05/2016 at 1:57 PM  Between 7am to 6pm - Pager - [phone]  After 6pm go  to [email or web address] - password [email or web address] [place] [facility]  Office  [phone]  CC: Primary care physician; [name], MD

## 2016-07-05 NOTE — Clinical Social Work Placement (Signed)
   CLINICAL SOCIAL WORK PLACEMENT  NOTE  Date:  07/05/2016  Patient Details  Name: Micheal [name] MRN: [id number] Date of Birth: 06/08/1941  Clinical Social Work is seeking post-discharge placement for this patient at the [place] level of care (*CSW will initial, date and re-position this form in  chart as items are completed):  Yes   Patient/family provided with [place] Work Department's list of facilities offering this level of care within the geographic area requested by the patient (or if unable, by the patient's family).  Yes   Patient/family informed of their freedom to choose among providers that offer the needed level of care, that participate in Medicare, Medicaid or managed care program needed by the patient, have an available bed and are willing to accept the patient.  Yes   Patient/family informed of [facility]'s ownership interest in [facility] and [facility], as well as of the fact that they are under no obligation to receive care at these facilities.  PASRR submitted to EDS on       PASRR number received on       Existing PASRR number confirmed on 07/05/16     FL2 transmitted to all facilities in geographic area requested by pt/family on 07/05/16     FL2 transmitted to all facilities within larger geographic area on       Patient informed that his/her managed care company has contracts with or will negotiate with certain facilities, including the following:            Patient/family informed of bed offers received.  Patient chooses bed at       Physician recommends and patient chooses bed at      Patient to be transferred to   on  .  Patient to be transferred to facility by       Patient family notified on   of transfer.  Name of family member notified:        PHYSICIAN       Additional Comment:    _______________________________________________ Quame Spratlin, [place] Beets, LCSW 07/05/2016, 2:09 PM

## 2016-07-05 NOTE — ED Triage Notes (Signed)
Pt to ED today after daughter found him fallen on the floor at his home.  Pt fell at 0200 and was found at 0700.  Pt states he was trying to get back to bed and tripped and fell, hitting head on the floor.  Denies head pain, but complains of left lower leg pain and was unable to weight bear on that leg on the scene, per EMS report.

## 2016-07-05 NOTE — NC FL2 (Signed)
[place] LEVEL OF CARE SCREENING TOOL     IDENTIFICATION  Patient Name: Micheal [name] Birthdate: 1941/03/26 Sex: male Admission Date (Current Location): 07/05/2016  [place] and [place] Number:  Engineering geologist and Address:  [facility], [address] [place], [place] [address]      Provider Number: [id number]  Attending Physician Name and Address:  Gladstone Lighter, MD  Relative Name and Phone Number:       Current Level of Care: Hospital Recommended Level of Care: [place] Prior Approval Number:    Date Approved/Denied:   PASRR Number:  ([id number])  Discharge Plan: SNF    Current Diagnoses: Patient Active Problem List   Diagnosis Date Noted  . Hip fracture ([place]) 07/05/2016  . Thrombocytopenia ([place])   . Preventative health care 04/15/2015  . Chronic hypoxemic respiratory failure ([place]) 04/15/2015  . BPH (benign prostatic hypertrophy) 09/22/2014  . Encounter for therapeutic drug monitoring 12/17/2013  . Chronic back pain   . Long term (current) use of anticoagulants 11/05/2012  . Hypertension   . Mood disorder ([place])   . Chronic kidney disease, stage IV (severe) ([place])   . Gout, unspecified   . LBBB 03/04/2009  . Atrial fibrillation ([place]) 03/04/2009  . Chronic systolic heart failure ([place]) 03/04/2009  . 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 )  AMBULATORY STATUS COMMUNICATION OF NEEDS Skin   Extensive Assist Verbally Surgical wounds                       Personal Care Assistance Level of Assistance  Bathing, Feeding, Dressing Bathing Assistance: Limited assistance Feeding assistance: Independent Dressing Assistance: Limited  assistance     Functional Limitations Info  Sight, Hearing, Speech Sight Info: Adequate Hearing Info: Adequate Speech Info: Adequate    SPECIAL CARE FACTORS FREQUENCY  PT (By licensed PT), OT (By licensed OT)     PT Frequency:  (5) OT Frequency:  (5)            Contractures      Additional Factors Info  Code Status, Allergies Code Status Info:  (Full Code. ) Allergies Info:  (No Known Allergies. )           Current Medications (07/05/2016):  This is the current hospital active medication list Current Facility-Administered Medications  Medication Dose Route Frequency Provider Last Rate Last Dose  . 0.9 %  sodium chloride infusion   Intravenous Continuous Gladstone Lighter, MD 50 mL/hr at 07/05/16 1323    . acetaminophen (TYLENOL) tablet 650 mg  650 mg Oral Q6H PRN Gladstone Lighter, MD       Or  . acetaminophen (TYLENOL) suppository 650 mg  650 mg Rectal Q6H PRN Gladstone Lighter, MD      . Derrill Memo ON 07/06/2016] ceFAZolin (ANCEF) IVPB 2g/100 mL premix  2 g Intravenous Once Hessie Knows, MD      . HYDROcodone-acetaminophen [facility]) 10-325 MG per tablet 1 tablet  1 tablet Oral Q6H PRN Gladstone Lighter, MD   1 tablet at 07/05/16 1125  . HYDROmorphone (DILAUDID) injection 1 mg  1 mg Intravenous Q2H PRN Hessie Knows, MD   1 mg at 07/05/16 1248  .  ondansetron (ZOFRAN) tablet 4 mg  4 mg Oral Q6H PRN Gladstone Lighter, MD       Or  . ondansetron (ZOFRAN) injection 4 mg  4 mg Intravenous Q6H PRN Gladstone Lighter, MD      . oxyCODONE (OXYCONTIN) 12 hr tablet 15 mg  15 mg Oral Q12H Hessie Knows, MD   15 mg at 07/05/16 1341     Discharge Medications: Please see discharge summary for a list of discharge medications.  Relevant Imaging Results:  Relevant Lab Results:   Additional Information  (SSN: [id number])  Laini Urick, Veronia Beets, LCSW

## 2016-07-05 NOTE — ED Notes (Signed)
Called admitting MD for pain medication orders.

## 2016-07-05 NOTE — ED Provider Notes (Signed)
[facility] Emergency Department Provider Note    First MD Initiated Contact with Patient 07/05/16 [phone]     (approximate)  I have reviewed the triage vital signs and the nursing notes.   HISTORY  Chief Complaint Fall and Leg Pain    HPI Micheal [name] is a 75 y.o. male with a history of chronic congestive heart failure on Coumadin as well as a history of A. fib presents after mechanical fall that occurred roughly at 2 AM in the morning. Patient was at home on was really out of bed to the restroom. He is wearing slippers and states that he lost his footing, fell back onto his left hip and hit his head. Denies any loss of consciousness. Denies any headache or blurred vision. Patient was checked on by his daughter who found him laying on the floor. Called EMS. When EMS arrived patient was unable to bear weight on the left lower extremity. Patient has been unable to get himself up due to chronic upper extremity weakness in the setting of a chronic left humeral head fracture.  He denies any shortness of breath or chest pain. He denies any abdominal pain. No back pain.   Past Medical History:  Diagnosis Date  . Anxiety   . Atrial fibrillation ([place])   . 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 CAD (coronary artery disease)   . CHF (congestive heart failure) ([place])   . Chronic back pain   . Chronic systolic heart failure (HCC) ~2009   EF under 25%  . CKD (chronic kidney disease) stage 3, GFR 30-59 ml/min 2009  . Gout, unspecified   . Hypertension   . Other left bundle branch block   . Thrombocytopenia [facility])     Patient Active Problem List   Diagnosis Date Noted  . Hip fracture ([place]) 07/05/2016  . Thrombocytopenia ([place])   . Preventative health care 04/15/2015  . Chronic hypoxemic respiratory failure ([place]) 04/15/2015  . BPH (benign prostatic hypertrophy) 09/22/2014  . Encounter for therapeutic drug monitoring 12/17/2013  .  Chronic back pain   . Long term (current) use of anticoagulants 11/05/2012  . Hypertension   . Mood disorder ([place])   . Chronic kidney disease, stage IV (severe) ([place])   . Gout, unspecified   . LBBB 03/04/2009  . Atrial fibrillation ([place]) 03/04/2009  . Chronic systolic heart failure ([place]) 03/04/2009  . ICD - IN SITU 03/04/2009    Past Surgical History:  Procedure Laterality Date  . Fracture of left humerus  2008   Rebroken 12/13  . ICD implant     St. Jude [place]; 12/27/07-CHF, afib, LBB, 3 vessel disease    Prior to Admission medications   Medication Sig Start Date End Date Taking? Authorizing Provider  ALPRAZolam Duanne Moron) 0.5 MG tablet TAKE ONE TABLET BY MOUTH THREE TIMES DAILY AS NEEDED 06/13/16  Yes Venia Carbon, MD  calcitRIOL (ROCALTROL) 0.25 MCG capsule Take 0.25 mcg by mouth daily.   Yes Historical Provider, MD  carvedilol (COREG) 3.125 MG tablet TAKE ONE TABLET BY MOUTH TWICE DAILY WITH A MEAL 06/26/16  Yes Venia Carbon, MD  citalopram (CELEXA) 40 MG tablet TAKE ONE TABLET BY MOUTH ONCE DAILY 05/01/16  Yes Venia Carbon, MD  cyclobenzaprine (FLEXERIL) 5 MG tablet Take 1 tablet (5 mg total) by mouth 2 (two) times daily as needed for muscle spasms. 05/08/13  Yes Venia Carbon, MD  febuxostat (ULORIC) 40 MG  tablet Take 2 tablets (80 mg total) by mouth daily. 01/26/14  Yes Venia Carbon, MD  furosemide (LASIX) 80 MG tablet TAKE ONE TO TWO TABLETS BY MOUTH ONCE DAILY AS NEEDED FOR SWELLING AND FLUID 01/17/16  Yes Venia Carbon, MD  HYDROcodone-acetaminophen (NORCO) 10-325 MG tablet Take 1 tablet by mouth every 6 (six) hours as needed. 07/03/16  Yes Ria Bush, MD  hydrOXYzine (ATARAX/VISTARIL) 50 MG tablet TAKE ONE TABLET BY MOUTH THREE TIMES DAILY AS NEEDED 04/24/16  Yes Venia Carbon, MD  lisinopril (PRINIVIL,ZESTRIL) 5 MG tablet Take 2.5 mg by mouth daily.   Yes Historical Provider, MD  metolazone (ZAROXOLYN) 5 MG tablet Take by mouth. 06/02/13  Yes Historical  Provider, MD  polyethylene glycol (MIRALAX / GLYCOLAX) packet Take 17 g by mouth daily.   Yes Historical Provider, MD  potassium chloride (K-DUR) 10 MEQ tablet TAKE ONE TABLET BY MOUTH ONCE DAILY 03/27/16  Yes Venia Carbon, MD  pravastatin (PRAVACHOL) 80 MG tablet TAKE ONE TABLET BY MOUTH ONCE DAILY 04/24/16  Yes Venia Carbon, MD  tamsulosin (FLOMAX) 0.4 MG CAPS capsule Take 0.4 mg by mouth.   Yes Historical Provider, MD  warfarin (COUMADIN) 5 MG tablet TAKE AS DIRECTED BY  ANTI-COAGULATION  CLINIC 05/01/16  Yes Venia Carbon, MD    Allergies Review of patient's allergies indicates no known allergies.  Family History  Problem Relation Age of Onset  . Heart failure Mother   . CAD Father   . Diabetes Mellitus II Father     Social History Social History  Substance Use Topics  . Smoking status: Former Smoker    Quit date: 09/25/1993  . Smokeless tobacco: Never Used     Comment: quit 15 years ago  . Alcohol use No     Comment: past use but not currently    Review of Systems Patient denies headaches, rhinorrhea, blurry vision, numbness, shortness of breath, chest pain, edema, cough, abdominal pain, nausea, vomiting, diarrhea, dysuria, fevers, rashes or hallucinations unless otherwise stated above in HPI. ____________________________________________   PHYSICAL EXAM:  VITAL SIGNS: Vitals:   07/05/16 1130 07/05/16 1221  BP: 121/64 133/74  Pulse: 76 76  Resp: 18 16  Temp:  97.8 F (36.6 C)    Constitutional: Alert and oriented. Chronically ill-appearing , in no acute distress. Eyes: Conjunctivae are normal. PERRL. EOMI. Head: Atraumatic. Nose: No congestion/rhinnorhea. Mouth/Throat: Mucous membranes are moist.  Oropharynx non-erythematous. Neck: No stridor. Painless ROM. No cervical spine tenderness to palpation Hematological/Lymphatic/Immunilogical: No cervical lymphadenopathy. Cardiovascular: Normal rate, regular rhythm. Grossly normal heart sounds.  Good peripheral  circulation. Respiratory: Normal respiratory effort.  No retractions. Lungs CTAB. Gastrointestinal: Soft and nontender. No distention. No abdominal bruits. No CVA tenderness.  Musculoskeletal: Bilateral lower extremity chronic venous stasis edema and ulcers below the mid shin. Left lower extremity is shortened compared to the right. There is pain with log roll of the left hip. No joint effusions.  SILT distally, Brisk cap refill Neurologic:  Normal speech and language.  Chronic BUE tremor, L>R. No gross focal neurologic deficits are appreciated. No gait instability. Skin:  Skin is warm, dry and intact. No rash noted. Psychiatric: Mood and affect are normal. Speech and behavior are normal.  ____________________________________________   LABS (all labs ordered are listed, but only abnormal results are displayed)  Results for orders placed or performed during the hospital encounter of 07/05/16 (from the past 24 hour(s))  CBC with Differential/Platelet     Status: Abnormal  Collection Time: 07/05/16  7:56 AM  Result Value Ref Range   WBC 9.8 3.8 - 10.6 K/uL   RBC 3.95 (L) 4.40 - 5.90 MIL/uL   Hemoglobin 12.0 (L) 13.0 - 18.0 g/dL   HCT 35.2 (L) 40.0 - 52.0 %   MCV 89.0 80.0 - 100.0 fL   MCH 30.4 26.0 - 34.0 pg   MCHC 34.2 32.0 - 36.0 g/dL   RDW 14.1 11.5 - 14.5 %   Platelets 159 150 - 440 K/uL   Neutrophils Relative % 90 %   Neutro Abs 8.7 (H) 1.4 - 6.5 K/uL   Lymphocytes Relative 5 %   Lymphs Abs 0.5 (L) 1.0 - 3.6 K/uL   Monocytes Relative 5 %   Monocytes Absolute 0.5 0.2 - 1.0 K/uL   Eosinophils Relative 0 %   Eosinophils Absolute 0.0 0 - 0.7 K/uL   Basophils Relative 0 %   Basophils Absolute 0.0 0 - 0.1 K/uL  Comprehensive metabolic panel     Status: Abnormal   Collection Time: 07/05/16  7:56 AM  Result Value Ref Range   Sodium 136 135 - 145 mmol/L   Potassium 3.9 3.5 - 5.1 mmol/L   Chloride 94 (L) 101 - 111 mmol/L   CO2 30 22 - 32 mmol/L   Glucose, Bld 165 (H) 65 - 99  mg/dL   BUN 86 (H) 6 - 20 mg/dL   Creatinine, Ser 3.62 (H) 0.61 - 1.24 mg/dL   Calcium 9.9 8.9 - 10.3 mg/dL   Total Protein 7.8 6.5 - 8.1 g/dL   Albumin 4.2 3.5 - 5.0 g/dL   AST 19 15 - 41 U/L   ALT 15 (L) 17 - 63 U/L   Alkaline Phosphatase 58 38 - 126 U/L   Total Bilirubin 0.7 0.3 - 1.2 mg/dL   GFR calc non Af Amer 15 (L) >60 mL/min   GFR calc Af Amer 18 (L) >60 mL/min   Anion gap 12 5 - 15  Protime-INR     Status: Abnormal   Collection Time: 07/05/16  7:56 AM  Result Value Ref Range   Prothrombin Time 23.1 (H) 11.4 - 15.2 seconds   INR 2.01   CK     Status: None   Collection Time: 07/05/16  7:56 AM  Result Value Ref Range   Total CK 140 49 - 397 U/L  Urinalysis complete, with microscopic ([facility] only)     Status: Abnormal   Collection Time: 07/05/16  7:59 AM  Result Value Ref Range   Color, Urine STRAW (A) YELLOW   APPearance CLEAR (A) CLEAR   Glucose, UA 150 (A) NEGATIVE mg/dL   Bilirubin Urine NEGATIVE NEGATIVE   Ketones, ur NEGATIVE NEGATIVE mg/dL   Specific Gravity, Urine 1.009 1.005 - 1.030   Hgb urine dipstick 3+ (A) NEGATIVE   pH 7.0 5.0 - 8.0   Protein, ur 30 (A) NEGATIVE mg/dL   Nitrite NEGATIVE NEGATIVE   Leukocytes, UA 1+ (A) NEGATIVE   RBC / HPF TOO NUMEROUS TO COUNT 0 - 5 RBC/hpf   WBC, UA 6-30 0 - 5 WBC/hpf   Bacteria, UA NONE SEEN NONE SEEN   Squamous Epithelial / LPF 0-5 (A) NONE SEEN  Surgical PCR screen     Status: Abnormal   Collection Time: 07/05/16  1:18 PM  Result Value Ref Range   MRSA, PCR POSITIVE (A) NEGATIVE   Staphylococcus aureus POSITIVE (A) NEGATIVE   ____________________________________________ ____________________________________________  RADIOLOGY  I personally reviewed all radiographic images ordered  to evaluate for the above acute complaints and reviewed radiology reports and findings.  These findings were personally discussed with the patient.  Please see medical record for radiology  report.  ____________________________________________   PROCEDURES  Procedure(s) performed: none    Critical Care performed: no ____________________________________________   INITIAL IMPRESSION / ASSESSMENT AND PLAN / ED COURSE  Pertinent labs & imaging results that were available during my care of the patient were reviewed by me and considered in my medical decision making (see chart for details).  DDX: SAH,, IPH, SDH, fracture, contusion  HAZLE ROSENDAHL is a 75 y.o. who presents to the ED with mechanical fall from standing with head injury and left hip pain. Patient afebrile and otherwise he mechanically stable. Patient with complex presentation given his significant can't comorbidities and that that he is on anticoagulation with Coumadin. Radiographic testing ordered to evaluate for the above presentation and my concern for acute traumatic injury.  The patient will be placed on continuous pulse oximetry and telemetry for monitoring.  Laboratory evaluation will be sent to evaluate for the above complaints.     Clinical Course  Comment By Time  X-ray imaging shows evidence of acute left intertrochanteric fracture.  However is reviewed results of CT head showing evidence of small meningioma without mass effect. Merlyn Lot, MD 10/11 [phone]  I spoke with Dr. Bridgett Larsson and Dr. Antionette Char regarding the patient's presentation.  Have discussed with the patient and available family all diagnostics and treatments performed thus far and all questions were answered to the best of my ability. The patient demonstrates understanding and agreement with plan.  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    Merlyn Lot, MD 07/05/16 1556

## 2016-07-06 LAB — URINALYSIS COMPLETE WITH MICROSCOPIC (ARMC ONLY)
Bilirubin Urine: NEGATIVE
GLUCOSE, UA: NEGATIVE mg/dL
Ketones, ur: NEGATIVE mg/dL
Nitrite: NEGATIVE
PROTEIN: 100 mg/dL — AB
SPECIFIC GRAVITY, URINE: 1.013 (ref 1.005–1.030)
pH: 6 (ref 5.0–8.0)

## 2016-07-06 LAB — CBC
HEMATOCRIT: 36.3 % — AB (ref 40.0–52.0)
HEMATOCRIT: 29.6 % — AB (ref 40.0–52.0)
HEMOGLOBIN: 12.2 g/dL — AB (ref 13.0–18.0)
HEMOGLOBIN: 9.8 g/dL — AB (ref 13.0–18.0)
MCH: 29.7 pg (ref 26.0–34.0)
MCH: 29.5 pg (ref 26.0–34.0)
MCHC: 33.6 g/dL (ref 32.0–36.0)
MCHC: 33 g/dL (ref 32.0–36.0)
MCV: 88.5 fL (ref 80.0–100.0)
MCV: 89.2 fL (ref 80.0–100.0)
Platelets: 176 10*3/uL (ref 150–440)
Platelets: 124 10*3/uL — ABNORMAL LOW (ref 150–440)
RBC: 4.1 MIL/uL — ABNORMAL LOW (ref 4.40–5.90)
RBC: 3.32 MIL/uL — ABNORMAL LOW (ref 4.40–5.90)
RDW: 14.3 % (ref 11.5–14.5)
RDW: 14 % (ref 11.5–14.5)
WBC: 12.9 10*3/uL — ABNORMAL HIGH (ref 3.8–10.6)
WBC: 17.7 10*3/uL — ABNORMAL HIGH (ref 3.8–10.6)

## 2016-07-06 LAB — BASIC METABOLIC PANEL
ANION GAP: 12 (ref 5–15)
BUN: 81 mg/dL — AB (ref 6–20)
CO2: 31 mmol/L (ref 22–32)
Calcium: 9.6 mg/dL (ref 8.9–10.3)
Chloride: 97 mmol/L — ABNORMAL LOW (ref 101–111)
Creatinine, Ser: 3.37 mg/dL — ABNORMAL HIGH (ref 0.61–1.24)
GFR calc Af Amer: 19 mL/min — ABNORMAL LOW (ref >60)
GFR, EST NON AFRICAN AMERICAN: 17 mL/min — AB (ref >60)
GLUCOSE: 158 mg/dL — AB (ref 65–99)
POTASSIUM: 3.5 mmol/L (ref 3.5–5.1)
Sodium: 140 mmol/L (ref 135–145)

## 2016-07-06 LAB — PROTEIN ELECTROPHORESIS, SERUM
A/G RATIO SPE: 1.6 (ref 0.7–1.7)
ALPHA-2-GLOBULIN: 0.7 g/dL (ref 0.4–1.0)
Albumin ELP: 4.2 g/dL (ref 2.9–4.4)
Alpha-1-Globulin: 0.2 g/dL (ref 0.0–0.4)
BETA GLOBULIN: 0.9 g/dL (ref 0.7–1.3)
GAMMA GLOBULIN: 0.9 g/dL (ref 0.4–1.8)
Globulin, Total: 2.7 g/dL (ref 2.2–3.9)
Total Protein ELP: 6.9 g/dL (ref 6.0–8.5)

## 2016-07-06 LAB — PROTIME-INR
INR: 2.01
Prothrombin Time: 23.1 seconds — ABNORMAL HIGH (ref 11.4–15.2)

## 2016-07-06 LAB — PROTEIN / CREATININE RATIO, URINE
CREATININE, URINE: 140 mg/dL
PROTEIN CREATININE RATIO: 0.48 mg/mg{creat} — AB (ref 0.00–0.15)
TOTAL PROTEIN, URINE: 67 mg/dL

## 2016-07-06 LAB — PARATHYROID HORMONE, INTACT (NO CA): PTH: 175 pg/mL — AB (ref 15–65)

## 2016-07-06 MED ORDER — VITAMIN K1 10 MG/ML IJ SOLN
5.0000 mg | Freq: Once | INTRAMUSCULAR | Status: AC
  Administered 2016-07-06: 5 mg via SUBCUTANEOUS
  Filled 2016-07-06: qty 0.5

## 2016-07-06 MED ORDER — DEXTROSE 5 % IV SOLN
1.0000 g | INTRAVENOUS | Status: DC
Start: 2016-07-06 — End: 2016-07-11
  Administered 2016-07-06 – 2016-07-11 (×5): 1 g via INTRAVENOUS
  Filled 2016-07-06 (×7): qty 10

## 2016-07-06 MED ORDER — GUAIFENESIN-DM 100-10 MG/5ML PO SYRP
5.0000 mL | ORAL_SOLUTION | ORAL | Status: DC | PRN
  Administered 2016-07-06: 5 mL via ORAL
  Filled 2016-07-06: qty 5

## 2016-07-06 MED ORDER — SODIUM CHLORIDE 0.9 % IV SOLN
INTRAVENOUS | Status: DC
  Administered 2016-07-06: 17:00:00 via INTRAVENOUS

## 2016-07-06 NOTE — Progress Notes (Signed)
[facility] [place] NAME: Micheal [name]    MR#:  [id number]  DATE OF BIRTH:  August 13, 1941  SUBJECTIVE:  CHIEF COMPLAINT:   Chief Complaint  Patient presents with  . Fall  . Leg Pain     Came with a fall and hip fracture.   Also have A fib on coumadin , and CKD.   Today have c/o congestion in chest.   INR is 2.0  REVIEW OF SYSTEMS:   Constitutional: Positive for malaise/fatigue. Negative for chills, fever and weight loss.  HENT: Negative for ear discharge, ear pain, hearing loss and nosebleeds.   Eyes: Negative for blurred vision, double vision and photophobia.  Respiratory: Negative for cough, hemoptysis, shortness of breath and wheezing.   Cardiovascular: Positive for leg swelling. Negative for chest pain, palpitations and orthopnea.  Gastrointestinal: Negative for abdominal pain, constipation, diarrhea, heartburn, melena, nausea and vomiting.  Genitourinary: Negative for dysuria and urgency.  Musculoskeletal: Positive for falls, joint pain and myalgias. Negative for back pain and neck pain.  Skin: Negative for rash.  Neurological: Negative for dizziness, tingling, sensory change, speech change, focal weakness and headaches.  Endo/Heme/Allergies: Does not bruise/bleed easily.  Psychiatric/Behavioral: Negative for depression.   ROS  DRUG ALLERGIES:  No Known Allergies  VITALS:  Blood pressure 94/64, pulse 72, temperature 98.3 F (36.8 C), temperature source Oral, resp. rate 17, height 5\' 10"  (1.778 m), weight 110.2 kg (243 lb), SpO2 97 %.  PHYSICAL EXAMINATION:   GENERAL:  75 y.o.-year-old patient lying in the bed with no acute distress. Dissheleved appearing. EYES: Pupils equal, round, reactive to light and accommodation. No scleral icterus. Extraocular muscles intact.  HEENT: Head atraumatic, normocephalic. Oropharynx and nasopharynx clear.  NECK:  Supple, no jugular venous distention. No thyroid enlargement, no tenderness.   LUNGS: Normal breath sounds bilaterally, no wheezing, rales,rhonchi or crepitation. No use of accessory muscles of respiration. Decreased bibasilar breath sounds. Some coarse coughing during my exam. CARDIOVASCULAR: S1, S2 normal. No  rubs, or gallops. 3/6 systolic murmur in place ABDOMEN: Soft, nontender, nondistended. Bowel sounds present. No organomegaly or mass.  EXTREMITIES: Left leg is abducted and externally rotated. Intact neurovascular bundle. No cyanosis, or clubbing. 2+ lower extremity edema with chronic pigmentation changes NEUROLOGIC: Cranial nerves II through XII are intact. Muscle strength 5/5 in all extremities. Sensation intact. Gait not checked.  PSYCHIATRIC: The patient is alert and oriented x 3.  SKIN: No obvious rash, lesion, or ulcer.   Physical Exam LABORATORY PANEL:   CBC  Recent Labs Lab 07/06/16 0419  WBC 12.9*  HGB 12.2*  HCT 36.3*  PLT 176   ------------------------------------------------------------------------------------------------------------------  Chemistries   Recent Labs Lab 07/05/16 0756 07/06/16 0419  NA 136 140  K 3.9 3.5  CL 94* 97*  CO2 30 31  GLUCOSE 165* 158*  BUN 86* 81*  CREATININE 3.62* 3.37*  CALCIUM 9.9 9.6  AST 19  --   ALT 15*  --   ALKPHOS 58  --   BILITOT 0.7  --    ------------------------------------------------------------------------------------------------------------------  Cardiac Enzymes No results for input(s): TROPONINI in the last 168 hours. ------------------------------------------------------------------------------------------------------------------  RADIOLOGY:  Dg Chest 2 View  Result Date: 07/05/2016 CLINICAL DATA:  Fall.  Left hip pain.  On Coumadin. EXAM: CHEST  2 VIEW COMPARISON:  04/15/2013 FINDINGS: Cardiac enlargement. Internal defibrillator unchanged. Mild vascular congestion. Negative for edema or effusion. Lungs are clear without infiltrate. IMPRESSION: No active cardiopulmonary  disease. Electronically Signed   By:  Franchot Gallo M.D.   On: 07/05/2016 09:05   Dg Ankle 2 Views Left  Result Date: 07/05/2016 CLINICAL DATA:  Pain following fall EXAM: LEFT ANKLE - 2 VIEW COMPARISON:  None. FINDINGS: Frontal and lateral views were obtained. There is mild generalized soft tissue swelling. There is evidence of old trauma in the medial malleolar region with remodeling. There is no evident acute fracture or joint effusion. The ankle mortise appears intact. There is mild narrowing in the lateral aspect of the ankle joint with mild spurring along the anterior aspect of the tibial plafond. There are spurs arising from the posterior and inferior calcaneus. IMPRESSION: Old trauma medial malleolus. No acute fracture. Ankle mortise appears intact. Evidence of osteoarthritic change laterally. Calcaneal spurs noted. There is mild generalized soft tissue swelling. Electronically Signed   By: Lowella Grip III M.D.   On: 07/05/2016 09:06   Ct Head Wo Contrast  Result Date: 07/05/2016 CLINICAL DATA:  Head injury. On Coumadin. Evaluate for intracranial hemorrhage. EXAM: CT HEAD WITHOUT CONTRAST TECHNIQUE: Contiguous axial images were obtained from the base of the skull through the vertex without intravenous contrast. COMPARISON:  09/16/2012 FINDINGS: Brain: Moderate low density in the periventricular white matter likely related to small vessel disease. No hemorrhage, hydrocephalus, acute infarct, intra-axial, or extra-axial fluid collection. Meningioma along the left anterior falx measures 9 mm on image 16/series 2. No significant mass effect. Vascular: Bilateral intracranial carotid and left vertebral artery atherosclerosis. Skull: No significant soft tissue swelling.  No skull fracture. Sinuses/Orbits: Normal orbits and globes. Clear paranasal sinuses and mastoid air cells. Other: None IMPRESSION: 1. No acute or posttraumatic deformity identified 2. Moderate small vessel ischemic change. 3. 9 mm  meningioma along the anterior falx, without significant mass effect or surrounding cerebral edema. Electronically Signed   By: Abigail Miyamoto M.D.   On: 07/05/2016 08:44   Dg Hip Unilat W Or Wo Pelvis 2-3 Views Left  Result Date: 07/05/2016 CLINICAL DATA:  Pain following fall EXAM: DG HIP (WITH OR WITHOUT PELVIS) 2-3V LEFT COMPARISON:  None. FINDINGS: Frontal pelvis as well as frontal and lateral left hip images were obtained. There is a comminuted fracture of the intertrochanteric femur region on the left with varus angulation at the fracture site. There is avulsion of the left lesser trochanter medially. No other fractures are evident. No dislocation. There is mild symmetric narrowing of both hip joints. There is extensive arterial vascular calcification in the iliac and superficial femoral arteries. Bones appear osteoporotic. IMPRESSION: Comminuted intertrochanteric femur fracture on the left with varus angulation at the fracture site and medial avulsion of the lesser trochanter. No other fractures are evident. Mild symmetric narrowing both hip joints. Bones osteoporotic. No dislocation. Extensive iliac and femoral artery atherosclerosis. Electronically Signed   By: Lowella Grip III M.D.   On: 07/05/2016 09:05    ASSESSMENT AND PLAN:   Active Problems:   Hip fracture (HCC)  #1 Left Hip fracture- secondary to fall. Orthopedics consulted. -Patient is moderate to high risk for surgery -Cardiology consult requested for cardiac clearance. INR is elevated, so held Coumadin. -One dose of oral vitamin K and INR is still >2, give one dose [place] vit K . Keep patient nothing by mouth after midnight for possible surgery tomorrow. -Explained risks to family and also discussed with orthopedic surgeon -Pain medication. 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 On Diuril takes at home. -Hold Lasix  and metolazone. Nephrology consulted. -Gentle hydration today in follow-up. Renally adjust all medications. -Check SPEP and UPEP and PTH as calcium also elevated.  #3 Chronic systolic CHF- well compensated, last known EF 25% - Chronic lower extremity edema. Hold Lasix and metolazone - Continue Coreg and statin.  #4 Afib- rate controlled, on coreg- continue - hold warfarin, s/p pacemaker  #5 Gout-continue home medications  #6 BPH- on flomax  #7 DVT Prophylaxis- now INR at 2, start after the surgery  #8 meningioma   incindental finding, discussed with his daughter in room.  # 9 hematuria  and UTI   Ur cx, Rocephin.   Monitor CBC.  All the records are reviewed and case discussed with Care Management/Social Workerr. Management plans discussed with the patient, family and they are in agreement.  CODE STATUS: Full  TOTAL TIME TAKING CARE OF THIS PATIENT: 35 minutes.     POSSIBLE D/C IN 3-4 DAYS, DEPENDING ON CLINICAL CONDITION.   Vaughan Basta M.D on 07/06/2016   Between 7am to 6pm - Pager - [phone]  After 6pm go to [email or web address] - password [email or web address] [facility]  Office  [phone]  CC: Primary care physician; [name], MD  Note: This dictation was prepared with Dragon dictation along with smaller phrase technology. Any transcriptional errors that result from this process are unintentional.

## 2016-07-06 NOTE — Progress Notes (Signed)
Social work Theatre manager met with patient and patient's two daughters at beside. Social work Theatre manager made patient and daughters aware that [facility] has a private room available for patient when discharged. Patient and patient's daughters all verbally accepted the bed offer. Per RN in progression rounds, patient may have surgery tomorrow pending medical clearance. Plan is for patient to discharge to [facility] most likely on Monday 07/10/16.  Danie Chandler, Social Work Intern  [phone]

## 2016-07-06 NOTE — Consult Note (Signed)
Reason for Consult: preoperative assessment congestive heart failure a AFib Referring Physician:  Dr. Andrey Farmer  hospitalist Dr. Rudene Christians orthopedics  Micheal [name] is an 75 y.o. male.  HPI:  75 year old male history of multiple medical problems congestive heart failure systolic dysfunction compensated cardiomyopathy ejection fraction around 25% atrial fibrillation anticoagulation with Coumadin rate control with Coreg renal insufficiency hypertension hyperlipidemia AICD in place recently had a fall fracturing his hip brought in for evaluation unable to bear weight on the left extremity with deformity. Patient has a chronic left humeral fracture which was treated conservatively but has led to significant weakness. Patient now is preop for hip surgery but  Has a  Coagulopathy with Coumadin. Patient complains of significant pain and is waiting for his INR to return to a reasonable level prior to surgery.  Past Medical History:  Diagnosis Date  . Anxiety   . Atrial fibrillation ([place])   . 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 CAD (coronary artery disease)   . CHF (congestive heart failure) ([place])   . Chronic back pain   . Chronic systolic heart failure (HCC) ~2009   EF under 25%  . CKD (chronic kidney disease) stage 3, GFR 30-59 ml/min 2009  . Gout, unspecified   . Hypertension   . Other left bundle branch block   . Thrombocytopenia ([place])     Past Surgical History:  Procedure Laterality Date  . Fracture of left humerus  2008   Rebroken 12/13  . ICD implant     St. Jude [place]; 12/27/07-CHF, afib, LBB, 3 vessel disease    Family History  Problem Relation Age of Onset  . Heart failure Mother   . CAD Father   . Diabetes Mellitus II Father     Social History:  reports that he quit smoking about 22 years ago. He has never used smokeless tobacco. He reports that he does not drink alcohol or use drugs.  Allergies: No Known Allergies  Medications: I have reviewed  the patient's current medications.  Results for orders placed or performed during the hospital encounter of 07/05/16 (from the past 48 hour(s))  CBC with Differential/Platelet     Status: Abnormal   Collection Time: 07/05/16  7:56 AM  Result Value Ref Range   WBC 9.8 3.8 - 10.6 K/uL   RBC 3.95 (L) 4.40 - 5.90 MIL/uL   Hemoglobin 12.0 (L) 13.0 - 18.0 g/dL   HCT 35.2 (L) 40.0 - 52.0 %   MCV 89.0 80.0 - 100.0 fL   MCH 30.4 26.0 - 34.0 pg   MCHC 34.2 32.0 - 36.0 g/dL   RDW 14.1 11.5 - 14.5 %   Platelets 159 150 - 440 K/uL   Neutrophils Relative % 90 %   Neutro Abs 8.7 (H) 1.4 - 6.5 K/uL   Lymphocytes Relative 5 %   Lymphs Abs 0.5 (L) 1.0 - 3.6 K/uL   Monocytes Relative 5 %   Monocytes Absolute 0.5 0.2 - 1.0 K/uL   Eosinophils Relative 0 %   Eosinophils Absolute 0.0 0 - 0.7 K/uL   Basophils Relative 0 %   Basophils Absolute 0.0 0 - 0.1 K/uL  Comprehensive metabolic panel     Status: Abnormal   Collection Time: 07/05/16  7:56 AM  Result Value Ref Range   Sodium 136 135 - 145 mmol/L   Potassium 3.9 3.5 - 5.1 mmol/L   Chloride 94 (L) 101 - 111 mmol/L   CO2 30 22 -  32 mmol/L   Glucose, Bld 165 (H) 65 - 99 mg/dL   BUN 86 (H) 6 - 20 mg/dL   Creatinine, Ser 3.62 (H) 0.61 - 1.24 mg/dL   Calcium 9.9 8.9 - 10.3 mg/dL   Total Protein 7.8 6.5 - 8.1 g/dL   Albumin 4.2 3.5 - 5.0 g/dL   AST 19 15 - 41 U/L   ALT 15 (L) 17 - 63 U/L   Alkaline Phosphatase 58 38 - 126 U/L   Total Bilirubin 0.7 0.3 - 1.2 mg/dL   GFR calc non Af Amer 15 (L) >60 mL/min   GFR calc Af Amer 18 (L) >60 mL/min    Comment: (NOTE) The eGFR has been calculated using the CKD EPI equation. This calculation has not been validated in all clinical situations. eGFR's persistently <60 mL/min signify possible Chronic Kidney Disease.    Anion gap 12 5 - 15  Protime-INR     Status: Abnormal   Collection Time: 07/05/16  7:56 AM  Result Value Ref Range   Prothrombin Time 23.1 (H) 11.4 - 15.2 seconds   INR 2.01   CK      Status: None   Collection Time: 07/05/16  7:56 AM  Result Value Ref Range   Total CK 140 49 - 397 U/L  Urinalysis complete, with microscopic ([facility] only)     Status: Abnormal   Collection Time: 07/05/16  7:59 AM  Result Value Ref Range   Color, Urine STRAW (A) YELLOW   APPearance CLEAR (A) CLEAR   Glucose, UA 150 (A) NEGATIVE mg/dL   Bilirubin Urine NEGATIVE NEGATIVE   Ketones, ur NEGATIVE NEGATIVE mg/dL   Specific Gravity, Urine 1.009 1.005 - 1.030   Hgb urine dipstick 3+ (A) NEGATIVE   pH 7.0 5.0 - 8.0   Protein, ur 30 (A) NEGATIVE mg/dL   Nitrite NEGATIVE NEGATIVE   Leukocytes, UA 1+ (A) NEGATIVE   RBC / HPF TOO NUMEROUS TO COUNT 0 - 5 RBC/hpf   WBC, UA 6-30 0 - 5 WBC/hpf   Bacteria, UA NONE SEEN NONE SEEN   Squamous Epithelial / LPF 0-5 (A) NONE SEEN  Surgical PCR screen     Status: Abnormal   Collection Time: 07/05/16  1:18 PM  Result Value Ref Range   MRSA, PCR POSITIVE (A) NEGATIVE    Comment: RESULT CALLED TO, READ BACK BY AND VERIFIED WITH: ERIN CARTER ON 07/05/16 AT 1553 [place]    Staphylococcus aureus POSITIVE (A) NEGATIVE    Comment:        The Xpert SA Assay (FDA approved for NASAL specimens in patients over 99 years of age), is one component of a comprehensive surveillance program.  Test performance has been validated by [facility] for patients greater than or equal to 53 year old. It is not intended to diagnose infection nor to guide or monitor treatment.   Parathyroid hormone, intact (no Ca)     Status: Abnormal   Collection Time: 07/05/16  2:35 PM  Result Value Ref Range   PTH 175 (H) 15 - 65 pg/mL    Comment: (NOTE) Performed At: [facility] [place], [place] [id number] Lindon Romp MD WL:[id number]   Basic metabolic panel     Status: Abnormal   Collection Time: 07/06/16  4:19 AM  Result Value Ref Range   Sodium 140 135 - 145 mmol/L   Potassium 3.5 3.5 - 5.1 mmol/L   Chloride 97 (L) 101 - 111 mmol/L   CO2 31  22 - 32  mmol/L   Glucose, Bld 158 (H) 65 - 99 mg/dL   BUN 81 (H) 6 - 20 mg/dL   Creatinine, Ser 3.37 (H) 0.61 - 1.24 mg/dL   Calcium 9.6 8.9 - 10.3 mg/dL   GFR calc non Af Amer 17 (L) >60 mL/min   GFR calc Af Amer 19 (L) >60 mL/min    Comment: (NOTE) The eGFR has been calculated using the CKD EPI equation. This calculation has not been validated in all clinical situations. eGFR's persistently <60 mL/min signify possible Chronic Kidney Disease.    Anion gap 12 5 - 15  CBC     Status: Abnormal   Collection Time: 07/06/16  4:19 AM  Result Value Ref Range   WBC 12.9 (H) 3.8 - 10.6 K/uL   RBC 4.10 (L) 4.40 - 5.90 MIL/uL   Hemoglobin 12.2 (L) 13.0 - 18.0 g/dL   HCT 36.3 (L) 40.0 - 52.0 %   MCV 88.5 80.0 - 100.0 fL   MCH 29.7 26.0 - 34.0 pg   MCHC 33.6 32.0 - 36.0 g/dL   RDW 14.3 11.5 - 14.5 %   Platelets 176 150 - 440 K/uL  Protime-INR     Status: Abnormal   Collection Time: 07/06/16  4:19 AM  Result Value Ref Range   Prothrombin Time 23.1 (H) 11.4 - 15.2 seconds   INR 2.01     Dg Chest 2 View  Result Date: 07/05/2016 CLINICAL DATA:  Fall.  Left hip pain.  On Coumadin. EXAM: CHEST  2 VIEW COMPARISON:  04/15/2013 FINDINGS: Cardiac enlargement. Internal defibrillator unchanged. Mild vascular congestion. Negative for edema or effusion. Lungs are clear without infiltrate. IMPRESSION: No active cardiopulmonary disease. Electronically Signed   By: Franchot Gallo M.D.   On: 07/05/2016 09:05   Dg Ankle 2 Views Left  Result Date: 07/05/2016 CLINICAL DATA:  Pain following fall EXAM: LEFT ANKLE - 2 VIEW COMPARISON:  None. FINDINGS: Frontal and lateral views were obtained. There is mild generalized soft tissue swelling. There is evidence of old trauma in the medial malleolar region with remodeling. There is no evident acute fracture or joint effusion. The ankle mortise appears intact. There is mild narrowing in the lateral aspect of the ankle joint with mild spurring along the anterior aspect of the  tibial plafond. There are spurs arising from the posterior and inferior calcaneus. IMPRESSION: Old trauma medial malleolus. No acute fracture. Ankle mortise appears intact. Evidence of osteoarthritic change laterally. Calcaneal spurs noted. There is mild generalized soft tissue swelling. Electronically Signed   By: Lowella Grip III M.D.   On: 07/05/2016 09:06   Ct Head Wo Contrast  Result Date: 07/05/2016 CLINICAL DATA:  Head injury. On Coumadin. Evaluate for intracranial hemorrhage. EXAM: CT HEAD WITHOUT CONTRAST TECHNIQUE: Contiguous axial images were obtained from the base of the skull through the vertex without intravenous contrast. COMPARISON:  09/16/2012 FINDINGS: Brain: Moderate low density in the periventricular white matter likely related to small vessel disease. No hemorrhage, hydrocephalus, acute infarct, intra-axial, or extra-axial fluid collection. Meningioma along the left anterior falx measures 9 mm on image 16/series 2. No significant mass effect. Vascular: Bilateral intracranial carotid and left vertebral artery atherosclerosis. Skull: No significant soft tissue swelling.  No skull fracture. Sinuses/Orbits: Normal orbits and globes. Clear paranasal sinuses and mastoid air cells. Other: None IMPRESSION: 1. No acute or posttraumatic deformity identified 2. Moderate small vessel ischemic change. 3. 9 mm meningioma along the anterior falx, without significant mass effect or  surrounding cerebral edema. Electronically Signed   By: Abigail Miyamoto M.D.   On: 07/05/2016 08:44   Dg Hip Unilat W Or Wo Pelvis 2-3 Views Left  Result Date: 07/05/2016 CLINICAL DATA:  Pain following fall EXAM: DG HIP (WITH OR WITHOUT PELVIS) 2-3V LEFT COMPARISON:  None. FINDINGS: Frontal pelvis as well as frontal and lateral left hip images were obtained. There is a comminuted fracture of the intertrochanteric femur region on the left with varus angulation at the fracture site. There is avulsion of the left lesser  trochanter medially. No other fractures are evident. No dislocation. There is mild symmetric narrowing of both hip joints. There is extensive arterial vascular calcification in the iliac and superficial femoral arteries. Bones appear osteoporotic. IMPRESSION: Comminuted intertrochanteric femur fracture on the left with varus angulation at the fracture site and medial avulsion of the lesser trochanter. No other fractures are evident. Mild symmetric narrowing both hip joints. Bones osteoporotic. No dislocation. Extensive iliac and femoral artery atherosclerosis. Electronically Signed   By: Lowella Grip III M.D.   On: 07/05/2016 09:05    Review of Systems  Constitutional: Positive for malaise/fatigue.  HENT: Positive for congestion.   Eyes: Negative.   Respiratory: Positive for shortness of breath.   Cardiovascular: Positive for palpitations, orthopnea and leg swelling.  Gastrointestinal: Negative.   Genitourinary: Negative.   Musculoskeletal: Positive for falls, joint pain and myalgias.  Skin: Negative.   Neurological: Positive for dizziness, loss of consciousness and weakness.  Endo/Heme/Allergies: Negative.   Psychiatric/Behavioral: Negative.    Blood pressure 94/64, pulse 72, temperature 98.3 F (36.8 C), temperature source Oral, resp. rate 17, height _0  (1.778 m), weight 110.2 kg (243 lb), SpO2 97 %. Physical Exam  Nursing note and vitals reviewed. Constitutional: He is oriented to person, place, and time. He appears well-developed and well-nourished.  HENT:  Head: Normocephalic and atraumatic.  Eyes: Conjunctivae and EOM are normal. Pupils are equal, round, and reactive to light.  Neck: Normal range of motion. Neck supple.  Cardiovascular: Normal rate, S1 normal, S2 normal and normal pulses.  An irregularly irregular rhythm present.  Murmur heard.  Systolic murmur is present with a grade of 2/6  Respiratory: Effort normal and breath sounds normal.  GI: Soft. Bowel sounds are  normal.  Musculoskeletal: He exhibits deformity.  Neurological: He is alert and oriented to person, place, and time. He has normal reflexes.  Skin: Skin is warm and dry.  Psychiatric: He has a normal mood and affect.    Assessment/Plan:  preop Hip fracture  atrial fibrillation  cardiomyopathy  congestive heart failure  AICD in place  anxiety   hypertension  abnormal EKG left bundle branch block  chronic renal insufficiency  coagulopathy secondary to Coumadin .  plan  patient appears to be  A moderate risk  hold Coumadin until INR below 1.8  continue heart failure therapy Prinivil Lasix Coreg  blood pressure control  continue diuretics with Lasix  continue beta-blockers pre and postoperative Coreg  hyperlipidemia therapy with Pravachol  supportive care pre and postop  will continue to follow  With you  Baneza Bartoszek D. 07/06/2016, 11:29 AM

## 2016-07-06 NOTE — Progress Notes (Signed)
Subjective:   Patient known to our practice from outpatient.  He is followed for chronic kidney disease.  His baseline creatinine is 2.25/GFR 28 based on labs from July 25 He presents post fall.  He is diagnosed with comminuted intertrochanteric fracture of the left femur He scheduled to undergo surgery tomorrow  Presenting creatinine is 3.62 Patient denies any fevers, chills, nausea, vomiting, dysuria or signs or symptoms of dehydration, hypotension as outpatient.  He was in his usual state of health prior to the fracture  With IV fluid administration, his serum creatinine has improved to 3.37 today Patient is weak in general.  Denies any acute shortness of breath. Lower extremity edema is better than it has been in the past.  Today, patient's daughter reports that he had pinkish/blood-tinged urine.  Objective:  Vital signs in last 24 hours:  Temp:  [97.5 F (36.4 C)-98.6 F (37 C)] 98.3 F (36.8 C) (10/12 0744) Pulse Rate:  [72-79] 72 (10/12 0800) Resp:  [16-28] 17 (10/12 0744) BP: (85-124)/(43-78) 94/64 (10/12 0800) SpO2:  [81 %-100 %] 97 % (10/12 0800)  Weight change:  Filed Weights   07/05/16 0746  Weight: 110.2 kg (243 lb)    Intake/Output:    Intake/Output Summary (Last 24 hours) at 07/06/16 1443 Last data filed at 07/06/16 0730  Gross per 24 hour  Intake                0 ml  Output             1450 ml  Net            -1450 ml     Physical Exam: General: Chronically ill-appearing, lying in the bed  HEENT Anicteric, moist mucous membranes  Neck supple  Pulm/lungs Normal breathing effort, decreased breath sounds at bases  CVS/Heart  no rub or gallop  Abdomen:  Soft, nontender, nondistended  Extremities: 1+ pitting edema bilaterally, tight edema  Neurologic: Alert, able to answer questions, and limited mobility of the legs  Skin: Dry peeling skin over both lower legs and feet          Basic Metabolic Panel:   Recent Labs Lab 07/05/16 0756  07/06/16 0419  NA 136 140  K 3.9 3.5  CL 94* 97*  CO2 30 31  GLUCOSE 165* 158*  BUN 86* 81*  CREATININE 3.62* 3.37*  CALCIUM 9.9 9.6     CBC:  Recent Labs Lab 07/05/16 0756 07/06/16 0419  WBC 9.8 12.9*  NEUTROABS 8.7*  --   HGB 12.0* 12.2*  HCT 35.2* 36.3*  MCV 89.0 88.5  PLT 159 176      Microbiology:  Recent Results (from the past 720 hour(s))  Surgical PCR screen     Status: Abnormal   Collection Time: 07/05/16  1:18 PM  Result Value Ref Range Status   MRSA, PCR POSITIVE (A) NEGATIVE Final    Comment: RESULT CALLED TO, READ BACK BY AND VERIFIED WITH: ERIN CARTER ON 07/05/16 AT 1553 [place]    Staphylococcus aureus POSITIVE (A) NEGATIVE Final    Comment:        The Xpert SA Assay (FDA approved for NASAL specimens in patients over 83 years of age), is one component of a comprehensive surveillance program.  Test performance has been validated by [facility] for patients greater than or equal to 14 year old. It is not intended to diagnose infection nor to guide or monitor treatment.     Coagulation Studies:  Recent Labs  07/05/16 0756 07/06/16 0419  LABPROT 23.1* 23.1*  INR 2.01 2.01    Urinalysis:  Recent Labs  07/05/16 0759  COLORURINE STRAW*  LABSPEC 1.009  PHURINE 7.0  GLUCOSEU 150*  HGBUR 3+*  BILIRUBINUR NEGATIVE  KETONESUR NEGATIVE  PROTEINUR 30*  NITRITE NEGATIVE  LEUKOCYTESUR 1+*      Imaging: Dg Chest 2 View  Result Date: 07/05/2016 CLINICAL DATA:  Fall.  Left hip pain.  On Coumadin. EXAM: CHEST  2 VIEW COMPARISON:  04/15/2013 FINDINGS: Cardiac enlargement. Internal defibrillator unchanged. Mild vascular congestion. Negative for edema or effusion. Lungs are clear without infiltrate. IMPRESSION: No active cardiopulmonary disease. Electronically Signed   By: Franchot Gallo M.D.   On: 07/05/2016 09:05   Dg Ankle 2 Views Left  Result Date: 07/05/2016 CLINICAL DATA:  Pain following fall EXAM: LEFT ANKLE - 2 VIEW COMPARISON:   None. FINDINGS: Frontal and lateral views were obtained. There is mild generalized soft tissue swelling. There is evidence of old trauma in the medial malleolar region with remodeling. There is no evident acute fracture or joint effusion. The ankle mortise appears intact. There is mild narrowing in the lateral aspect of the ankle joint with mild spurring along the anterior aspect of the tibial plafond. There are spurs arising from the posterior and inferior calcaneus. IMPRESSION: Old trauma medial malleolus. No acute fracture. Ankle mortise appears intact. Evidence of osteoarthritic change laterally. Calcaneal spurs noted. There is mild generalized soft tissue swelling. Electronically Signed   By: Lowella Grip III M.D.   On: 07/05/2016 09:06   Ct Head Wo Contrast  Result Date: 07/05/2016 CLINICAL DATA:  Head injury. On Coumadin. Evaluate for intracranial hemorrhage. EXAM: CT HEAD WITHOUT CONTRAST TECHNIQUE: Contiguous axial images were obtained from the base of the skull through the vertex without intravenous contrast. COMPARISON:  09/16/2012 FINDINGS: Brain: Moderate low density in the periventricular white matter likely related to small vessel disease. No hemorrhage, hydrocephalus, acute infarct, intra-axial, or extra-axial fluid collection. Meningioma along the left anterior falx measures 9 mm on image 16/series 2. No significant mass effect. Vascular: Bilateral intracranial carotid and left vertebral artery atherosclerosis. Skull: No significant soft tissue swelling.  No skull fracture. Sinuses/Orbits: Normal orbits and globes. Clear paranasal sinuses and mastoid air cells. Other: None IMPRESSION: 1. No acute or posttraumatic deformity identified 2. Moderate small vessel ischemic change. 3. 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 Electronically Signed   By: Abigail Miyamoto M.D.   On: 07/05/2016 08:44   Dg Hip Unilat W Or Wo Pelvis 2-3 Views  Left  Result Date: 07/05/2016 CLINICAL DATA:  Pain following fall EXAM: DG HIP (WITH OR WITHOUT PELVIS) 2-3V LEFT COMPARISON:  None. FINDINGS: Frontal pelvis as well as frontal and lateral left hip images were obtained. There is a comminuted fracture of the intertrochanteric femur region on the left with varus angulation at the fracture site. There is avulsion of the left lesser trochanter medially. No other fractures are evident. No dislocation. There is mild symmetric narrowing of both hip joints. There is extensive arterial vascular calcification in the iliac and superficial femoral arteries. Bones appear osteoporotic. IMPRESSION: Comminuted intertrochanteric femur fracture on the left with varus angulation at the fracture site and medial avulsion of the lesser trochanter. No other fractures are evident. Mild symmetric narrowing both hip joints. Bones osteoporotic. No dislocation. Extensive iliac and femoral artery atherosclerosis. Electronically Signed   By: Lowella Grip III M.D.   On: 07/05/2016  09:05     Medications:     . calcitRIOL  0.25 mcg Oral Daily  . carvedilol  3.125 mg Oral BID WC  .  ceFAZolin (ANCEF) IV  2 g Intravenous Once  . cefTRIAXone (ROCEPHIN)  IV  1 g Intravenous Q24H  . Chlorhexidine Gluconate Cloth  6 each Topical Q0600  . citalopram  40 mg Oral Daily  . febuxostat  80 mg Oral QHS  . mupirocin ointment  1 application Nasal BID  . oxyCODONE  15 mg Oral Q12H  . polyethylene glycol  17 g Oral Daily  . pravastatin  80 mg Oral q1800  . tamsulosin  0.4 mg Oral Daily   acetaminophen **OR** acetaminophen, ALPRAZolam, cyclobenzaprine, guaiFENesin-dextromethorphan, HYDROcodone-acetaminophen, HYDROmorphone (DILAUDID) injection, ondansetron **OR** ondansetron (ZOFRAN) IV  Assessment/ Plan:  75 y.o.caucasian male with hypertension, morbid obesity, gout, history of nonsteroidal use, chronic atrial fibrillation, hyperlipidemia,diabetes, depression, ICD, congestive heart  failure with LVEF 25%, chronic kidney disease now presents with comminuted intertrochanteric fracture of the left femur  1.  Acute renal failure on chronic kidney disease stage IV Cause of ARF not entirely clear but likely secondary to volume depletion Serum creatinine has started to improve with IV hydration Continue IV fluids at the current rate Agree with holding diuretics  2. Hematuria Detected on urinalysis yesterday Will repeat urinalysis today It is likely secondary to use of Coumadin for admission  3.  Lower extremity edema Acceptable control at present .    LOS: 1 Jolanda Mccann 10/12/20172:43 PM

## 2016-07-07 ENCOUNTER — Encounter: Payer: Self-pay | Admitting: *Deleted

## 2016-07-07 ENCOUNTER — Inpatient Hospital Stay: Payer: Medicare Other | Admitting: Anesthesiology

## 2016-07-07 ENCOUNTER — Inpatient Hospital Stay: Payer: Medicare Other

## 2016-07-07 ENCOUNTER — Encounter
Admission: EM | Disposition: A | Discharge status: Skilled Nursing Facility | Payer: Self-pay | Source: Home / Self Care | Attending: Internal Medicine

## 2016-07-07 ENCOUNTER — Inpatient Hospital Stay
Admit: 2016-07-07 | Discharge: 2016-07-07 | Disposition: A | Discharge status: Home / Self Care | Payer: Medicare Other | Attending: Internal Medicine | Admitting: Internal Medicine

## 2016-07-07 HISTORY — PX: INTRAMEDULLARY (IM) NAIL INTERTROCHANTERIC: SHX5875

## 2016-07-07 LAB — PROTEIN ELECTRO, RANDOM URINE
ALPHA-1-GLOBULIN, U: 3.6 %
ALPHA-2-GLOBULIN, U: 13.3 %
Albumin ELP, Urine: 40 %
BETA GLOBULIN, U: 31.5 %
GAMMA GLOBULIN, U: 11.6 %
M SPIKE UR: 6.6 % — AB
Total Protein, Urine: 24 mg/dL

## 2016-07-07 LAB — GLUCOSE, CAPILLARY
GLUCOSE-CAPILLARY: 132 mg/dL — AB (ref 65–99)
Glucose-Capillary: 141 mg/dL — ABNORMAL HIGH (ref 65–99)

## 2016-07-07 LAB — BASIC METABOLIC PANEL
ANION GAP: 10 (ref 5–15)
BUN: 91 mg/dL — ABNORMAL HIGH (ref 6–20)
CALCIUM: 9 mg/dL (ref 8.9–10.3)
CO2: 29 mmol/L (ref 22–32)
Chloride: 97 mmol/L — ABNORMAL LOW (ref 101–111)
Creatinine, Ser: 4.02 mg/dL — ABNORMAL HIGH (ref 0.61–1.24)
GFR, EST AFRICAN AMERICAN: 16 mL/min — AB (ref >60)
GFR, EST NON AFRICAN AMERICAN: 13 mL/min — AB (ref >60)
GLUCOSE: 132 mg/dL — AB (ref 65–99)
POTASSIUM: 3.8 mmol/L (ref 3.5–5.1)
SODIUM: 136 mmol/L (ref 135–145)

## 2016-07-07 LAB — CBC
HCT: 29 % — ABNORMAL LOW (ref 40.0–52.0)
Hemoglobin: 9.8 g/dL — ABNORMAL LOW (ref 13.0–18.0)
MCH: 30.1 pg (ref 26.0–34.0)
MCHC: 33.7 g/dL (ref 32.0–36.0)
MCV: 89.4 fL (ref 80.0–100.0)
PLATELETS: 128 10*3/uL — AB (ref 150–440)
RBC: 3.25 MIL/uL — AB (ref 4.40–5.90)
RDW: 14.1 % (ref 11.5–14.5)
WBC: 15 10*3/uL — AB (ref 3.8–10.6)

## 2016-07-07 LAB — ECHOCARDIOGRAM COMPLETE
HEIGHTINCHES: 70 in
Weight: 3888 oz

## 2016-07-07 LAB — PROTIME-INR
INR: 1.79
PROTHROMBIN TIME: 21 s — AB (ref 11.4–15.2)

## 2016-07-07 SURGERY — FIXATION, FRACTURE, INTERTROCHANTERIC, WITH INTRAMEDULLARY ROD
Anesthesia: Monitor Anesthesia Care | Laterality: Left | Wound class: Clean

## 2016-07-07 MED ORDER — LIDOCAINE HCL 1 % IJ SOLN
INTRAMUSCULAR | Status: DC | PRN
  Administered 2016-07-07: 40 mL

## 2016-07-07 MED ORDER — FENTANYL CITRATE (PF) 100 MCG/2ML IJ SOLN: 25.0000 ug | INTRAMUSCULAR | Status: DC | PRN

## 2016-07-07 MED ORDER — WARFARIN - PHYSICIAN DOSING INPATIENT: Freq: Every day | Status: DC

## 2016-07-07 MED ORDER — DEXTROSE 5 % IV SOLN
0.0000 ug/min | INTRAVENOUS | Status: DC
  Administered 2016-07-07: 20 ug/min via INTRAVENOUS
  Filled 2016-07-07: qty 1

## 2016-07-07 MED ORDER — BISACODYL 10 MG RE SUPP: 10.0000 mg | Freq: Every day | RECTAL | Status: DC | PRN

## 2016-07-07 MED ORDER — METOCLOPRAMIDE HCL 10 MG PO TABS: 5.0000 mg | ORAL_TABLET | Freq: Three times a day (TID) | ORAL | Status: DC | PRN

## 2016-07-07 MED ORDER — SODIUM CHLORIDE 0.9 % IV SOLN
INTRAVENOUS | Status: DC
  Administered 2016-07-07 – 2016-07-14 (×7): via INTRAVENOUS

## 2016-07-07 MED ORDER — KETAMINE HCL 50 MG/ML IJ SOLN
INTRAMUSCULAR | Status: DC | PRN
  Administered 2016-07-07: 35 mg via INTRAMUSCULAR
  Administered 2016-07-07: 40 mg via INTRAMUSCULAR
  Administered 2016-07-07: 50 mg via INTRAMUSCULAR
  Administered 2016-07-07: 35 mg via INTRAMUSCULAR

## 2016-07-07 MED ORDER — IPRATROPIUM-ALBUTEROL 0.5-2.5 (3) MG/3ML IN SOLN
3.0000 mL | RESPIRATORY_TRACT | Status: DC
  Administered 2016-07-07 – 2016-07-08 (×8): 3 mL via RESPIRATORY_TRACT
  Filled 2016-07-07 (×8): qty 3

## 2016-07-07 MED ORDER — SODIUM CHLORIDE 0.9 % IV SOLN
INTRAVENOUS | Status: DC | PRN
  Administered 2016-07-07: 20 ug/min via INTRAVENOUS

## 2016-07-07 MED ORDER — WARFARIN SODIUM 5 MG PO TABS: 5.0000 mg | ORAL_TABLET | Freq: Every day | ORAL | Status: DC

## 2016-07-07 MED ORDER — METOCLOPRAMIDE HCL 5 MG/ML IJ SOLN: 5.0000 mg | Freq: Three times a day (TID) | INTRAMUSCULAR | Status: DC | PRN

## 2016-07-07 MED ORDER — MIDAZOLAM HCL 2 MG/2ML IJ SOLN
INTRAMUSCULAR | Status: DC | PRN
  Administered 2016-07-07: 1 mg via INTRAVENOUS

## 2016-07-07 MED ORDER — FEBUXOSTAT 40 MG PO TABS
40.0000 mg | ORAL_TABLET | Freq: Every day | ORAL | Status: DC
  Administered 2016-07-07 – 2016-07-16 (×9): 40 mg via ORAL
  Filled 2016-07-07 (×8): qty 1

## 2016-07-07 MED ORDER — CEFAZOLIN SODIUM-DEXTROSE 2-4 GM/100ML-% IV SOLN
2.0000 g | Freq: Four times a day (QID) | INTRAVENOUS | Status: AC
  Administered 2016-07-07 – 2016-07-08 (×3): 2 g via INTRAVENOUS
  Filled 2016-07-07 (×3): qty 100

## 2016-07-07 MED ORDER — MENTHOL 3 MG MT LOZG
1.0000 | LOZENGE | OROMUCOSAL | Status: DC | PRN
  Filled 2016-07-07: qty 9

## 2016-07-07 MED ORDER — NEOMYCIN-POLYMYXIN B GU 40-200000 IR SOLN
Status: DC | PRN
  Administered 2016-07-07: 2 mL

## 2016-07-07 MED ORDER — TRANEXAMIC ACID 1000 MG/10ML IV SOLN
INTRAVENOUS | Status: DC | PRN
  Administered 2016-07-07: 1000 mg via TOPICAL

## 2016-07-07 MED ORDER — ALUM & MAG HYDROXIDE-SIMETH 200-200-20 MG/5ML PO SUSP: 30.0000 mL | ORAL | Status: DC | PRN

## 2016-07-07 MED ORDER — MAGNESIUM HYDROXIDE 400 MG/5ML PO SUSP
30.0000 mL | Freq: Every day | ORAL | Status: DC | PRN
  Administered 2016-07-08 – 2016-07-09 (×2): 30 mL via ORAL
  Filled 2016-07-07 (×2): qty 30

## 2016-07-07 MED ORDER — DOCUSATE SODIUM 100 MG PO CAPS
100.0000 mg | ORAL_CAPSULE | Freq: Two times a day (BID) | ORAL | Status: DC
  Administered 2016-07-07 – 2016-07-11 (×8): 100 mg via ORAL
  Filled 2016-07-07 (×8): qty 1

## 2016-07-07 MED ORDER — DEXTROSE 5 % IV SOLN
0.0000 ug/min | INTRAVENOUS | Status: DC
  Administered 2016-07-07: 10 ug/min via INTRAVENOUS
  Administered 2016-07-08: 6 ug/min via INTRAVENOUS
  Filled 2016-07-07: qty 1

## 2016-07-07 MED ORDER — SODIUM CHLORIDE 0.9 % IV SOLN
INTRAVENOUS | Status: DC | PRN
  Administered 2016-07-07: 13:00:00 via INTRAVENOUS

## 2016-07-07 MED ORDER — CEFAZOLIN SODIUM-DEXTROSE 2-3 GM-% IV SOLR
INTRAVENOUS | Status: DC | PRN
  Administered 2016-07-07: 2 g via INTRAVENOUS

## 2016-07-07 MED ORDER — PHENYLEPHRINE HCL 10 MG/ML IJ SOLN
INTRAMUSCULAR | Status: DC | PRN
  Administered 2016-07-07 (×2): 100 ug via INTRAVENOUS
  Administered 2016-07-07: 200 ug via INTRAVENOUS
  Administered 2016-07-07 (×3): 100 ug via INTRAVENOUS
  Administered 2016-07-07: 200 ug via INTRAVENOUS
  Administered 2016-07-07 (×2): 100 ug via INTRAVENOUS

## 2016-07-07 MED ORDER — KETAMINE HCL 10 MG/ML IJ SOLN
INTRAMUSCULAR | Status: DC | PRN
  Administered 2016-07-07 (×2): 20 mg via INTRAVENOUS

## 2016-07-07 MED ORDER — FENTANYL CITRATE (PF) 100 MCG/2ML IJ SOLN
INTRAMUSCULAR | Status: DC | PRN
  Administered 2016-07-07: 50 ug via INTRAVENOUS

## 2016-07-07 MED ORDER — PHENOL 1.4 % MT LIQD
1.0000 | OROMUCOSAL | Status: DC | PRN
  Filled 2016-07-07: qty 177

## 2016-07-07 MED ORDER — ONDANSETRON HCL 4 MG/2ML IJ SOLN: 4.0000 mg | Freq: Once | INTRAMUSCULAR | Status: DC | PRN

## 2016-07-07 MED ORDER — MAGNESIUM CITRATE PO SOLN
1.0000 | Freq: Once | ORAL | Status: DC | PRN
Start: 2016-07-07 — End: 2016-07-17
  Filled 2016-07-07: qty 296

## 2016-07-07 MED ORDER — DEXMEDETOMIDINE HCL IN NACL 200 MCG/50ML IV SOLN
INTRAVENOUS | Status: DC | PRN
  Administered 2016-07-07: .6 ug/kg/h via INTRAVENOUS
  Administered 2016-07-07: 13:00:00 via INTRAVENOUS

## 2016-07-07 MED ORDER — DEXMEDETOMIDINE HCL IN NACL 200 MCG/50ML IV SOLN
INTRAVENOUS | Status: DC | PRN
  Administered 2016-07-07: 4 ug via INTRAVENOUS

## 2016-07-07 MED ORDER — GLYCOPYRROLATE 0.2 MG/ML IJ SOLN
INTRAMUSCULAR | Status: DC | PRN
  Administered 2016-07-07: .4 mg via INTRAVENOUS

## 2016-07-07 SURGICAL SUPPLY — 39 items
BIT DRILL 4.3MMS DISTAL GRDTED (BIT) ×1 IMPLANT
BNDG COHESIVE 4X5 TAN STRL (GAUZE/BANDAGES/DRESSINGS) ×18 IMPLANT
CANISTER SUCT 1200ML W/VALVE (MISCELLANEOUS) ×3 IMPLANT
CHLORAPREP W/TINT 26ML (MISCELLANEOUS) ×3 IMPLANT
DRAPE SHEET LG 3/4 BI-LAMINATE (DRAPES) ×3 IMPLANT
DRAPE SURG 17X11 SM STRL (DRAPES) ×3 IMPLANT
DRAPE U-SHAPE 47X51 STRL (DRAPES) IMPLANT
DRILL 4.3MMS DISTAL GRADUATED (BIT) ×3
DRSG OPSITE POSTOP 4X6 (GAUZE/BANDAGES/DRESSINGS) ×9 IMPLANT
ELECT REM PT RETURN 9FT ADLT (ELECTROSURGICAL) ×3
ELECTRODE REM PT RTRN 9FT ADLT (ELECTROSURGICAL) ×1 IMPLANT
GAUZE SPONGE 4X4 12PLY STRL (GAUZE/BANDAGES/DRESSINGS) ×3 IMPLANT
GLOVE BIOGEL PI IND STRL 9 (GLOVE) ×1 IMPLANT
GLOVE BIOGEL PI INDICATOR 9 (GLOVE) ×2
GLOVE SURG SYN 9.0  PF PI (GLOVE) ×2
GLOVE SURG SYN 9.0 PF PI (GLOVE) ×1 IMPLANT
GOWN SRG 2XL LVL 4 RGLN SLV (GOWNS) ×1 IMPLANT
GOWN STRL NON-REIN 2XL LVL4 (GOWNS) ×2
GOWN STRL REUS W/ TWL LRG LVL3 (GOWN DISPOSABLE) ×1 IMPLANT
GOWN STRL REUS W/TWL LRG LVL3 (GOWN DISPOSABLE) ×2
GUIDEPIN VERSANAIL DSP 3.2X444 ×3 IMPLANT
GUIDEWIRE BALL NOSE 100CM (WIRE) ×3 IMPLANT
HFN LAG SCREW 10.5MM X 115MM (Orthopedic Implant) ×3 IMPLANT
HFN LH 130 DEG 11MM X 380MM (Orthopedic Implant) ×3 IMPLANT
IV NS 500ML (IV SOLUTION) ×2
IV NS 500ML BAXH (IV SOLUTION) ×1 IMPLANT
KIT RM TURNOVER STRD PROC AR (KITS) ×3 IMPLANT
MAT BLUE FLOOR 46X72 FLO (MISCELLANEOUS) ×3 IMPLANT
NEEDLE FILTER BLUNT 18X 1/2SAF (NEEDLE) ×2
NEEDLE FILTER BLUNT 18X1 1/2 (NEEDLE) ×1 IMPLANT
NEEDLE SPNL 18GX3.5 QUINCKE PK (NEEDLE) ×3 IMPLANT
PACK HIP COMPR (MISCELLANEOUS) ×3 IMPLANT
SCREW BONE CORTICAL 5.0X52 (Screw) ×3 IMPLANT
STAPLER SKIN PROX 35W (STAPLE) ×3 IMPLANT
SUT VIC AB 1 CT1 36 (SUTURE) ×3 IMPLANT
SUT VIC AB 2-0 CT1 (SUTURE) ×3 IMPLANT
SYRINGE 10CC LL (SYRINGE) ×3 IMPLANT
TAPE MICROFOAM 4IN (TAPE) ×3 IMPLANT
TRAY FOLEY W/METER SILVER 16FR (SET/KITS/TRAYS/PACK) ×3 IMPLANT

## 2016-07-07 NOTE — Progress Notes (Signed)
Dr Anselm Jungling in to see pt  Blood pressure in 21mcg/min  96/66   Coherent  All other assessments as before

## 2016-07-07 NOTE — Progress Notes (Signed)
Pt on neo at 40mcg/min  Dr Marcello Moores aware of patient drip number   Answers questions appropriately  Moves legs pedal pulse present one plus  In supine position at present   Blood pressure 92/64  Hip dsg dry ice pack to hip

## 2016-07-07 NOTE — Progress Notes (Signed)
S/p surgery today.   In the morning patient was requiring high amount of oxygen and had some pulmonary edema as per the chest x-ray.  I spoke to her family doctor as per him surgery was done under local anesthesia and some sedation.  After surgery patient was hypotensive in recovery room so he was started on phenylephrine IV drip.  After 2 hours he was still staying on phenylephrine drip so I was called in in recovery room to examine and evaluate the patient.  Physical exam  Patient is drowsy but easily arousable and oriented unarousable to time place and person.   He is not in any respiratory distress and oxygen saturation is more than 90% on just 2 L of nasal cannula oxygen.   On IV phenylephrine drip to maintain the blood pressure in 0000000 systolic.   Heart rate under control.   Auscultation mild crepitation bilateral lungs. Equal air entry bilaterally.  * Assessment and plan   * Hypertension   Cardiogenic shock secondary to severely low ejection fraction.   IV phenylephrine drip to maintain the blood pressure currently.   Because of acute systolic congestive heart failure. I will not give IV fluid at this time.   Monitoring in stepdown unit.    I discussed the case with cardiologist and nephrologist. Also discussed with recovery room nurse and with ICU nurse.   I spoke to patient's daughter's and explained them about the critical condition and requirement of monitoring in ICU for tonight.   I also explained them about possibilities off worsening in the blood pressure or respiratory status and requirement of BiPAP or intubation and ventilatory support.   They understand and agree with the full support for now.    Critical care time spent 35 minutes.

## 2016-07-07 NOTE — Progress Notes (Signed)
[facility] [place] NAME: Micheal [name]    MR#:  [id number]  DATE OF BIRTH:  05-26-1941  SUBJECTIVE:  CHIEF COMPLAINT:   Chief Complaint  Patient presents with  . Fall  . Leg Pain     Came with a fall and hip fracture.   Also have A fib on coumadin , and CKD.  have c/o congestion in chest.   INR is 1.7  REVIEW OF SYSTEMS:   Constitutional: Positive for malaise/fatigue. Negative for chills, fever and weight loss.  HENT: Negative for ear discharge, ear pain, hearing loss and nosebleeds.   Eyes: Negative for blurred vision, double vision and photophobia.  Respiratory: Negative for cough, hemoptysis, shortness of breath and wheezing.   Cardiovascular: Positive for leg swelling. Negative for chest pain, palpitations and orthopnea.  Gastrointestinal: Negative for abdominal pain, constipation, diarrhea, heartburn, melena, nausea and vomiting.  Genitourinary: Negative for dysuria and urgency.  Musculoskeletal: Positive for falls, joint pain and myalgias. Negative for back pain and neck pain.  Skin: Negative for rash.  Neurological: Negative for dizziness, tingling, sensory change, speech change, focal weakness and headaches.  Endo/Heme/Allergies: Does not bruise/bleed easily.  Psychiatric/Behavioral: Negative for depression.   ROS  DRUG ALLERGIES:  No Known Allergies  VITALS:  Blood pressure (!) 101/57, pulse 74, temperature (P) 97.3 F (36.3 C), resp. rate 18, height 5\' 10"  (1.778 m), weight 110.2 kg (243 lb), SpO2 98 %.  PHYSICAL EXAMINATION:   GENERAL:  75 y.o.-year-old patient lying in the bed with no acute distress. Dissheleved appearing. EYES: Pupils equal, round, reactive to light and accommodation. No scleral icterus. Extraocular muscles intact.  HEENT: Head atraumatic, normocephalic. Oropharynx and nasopharynx clear.  NECK:  Supple, no jugular venous distention. No thyroid enlargement, no tenderness.  LUNGS: Normal breath  sounds bilaterally, no wheezing, rales,rhonchi or crepitation. No use of accessory muscles of respiration. Decreased bibasilar breath sounds. Some coarse coughing during my exam. CARDIOVASCULAR: S1, S2 normal. No  rubs, or gallops. 3/6 systolic murmur in place ABDOMEN: Soft, nontender, nondistended. Bowel sounds present. No organomegaly or mass.  EXTREMITIES: Left leg is abducted and externally rotated. Intact neurovascular bundle. No cyanosis, or clubbing. 2+ lower extremity edema with chronic pigmentation changes NEUROLOGIC: Cranial nerves II through XII are intact. Muscle strength 4/5 in all extremities. Sensation intact. Gait not checked.  PSYCHIATRIC: The patient is alert and oriented x 3.  SKIN: No obvious rash, lesion, or ulcer.   Physical Exam LABORATORY PANEL:   CBC  Recent Labs Lab 07/07/16 0427  WBC 15.0*  HGB 9.8*  HCT 29.0*  PLT 128*   ------------------------------------------------------------------------------------------------------------------  Chemistries   Recent Labs Lab 07/05/16 0756  07/07/16 0427  NA 136  < > 136  K 3.9  < > 3.8  CL 94*  < > 97*  CO2 30  < > 29  GLUCOSE 165*  < > 132*  BUN 86*  < > 91*  CREATININE 3.62*  < > 4.02*  CALCIUM 9.9  < > 9.0  AST 19  --   --   ALT 15*  --   --   ALKPHOS 58  --   --   BILITOT 0.7  --   --   < > = values in this interval not displayed. ------------------------------------------------------------------------------------------------------------------  Cardiac Enzymes No results for input(s): TROPONINI in the last 168 hours. ------------------------------------------------------------------------------------------------------------------  RADIOLOGY:  Dg Chest 1 View  Result Date: 07/07/2016 CLINICAL DATA:  Hypoxia, shortness of breath  EXAM: CHEST 1 VIEW COMPARISON:  07/05/2016 FINDINGS: There is bilateral interstitial thickening and prominence of the central pulmonary vasculature. There is no focal  parenchymal opacity. There is no pleural effusion or pneumothorax. There is stable cardiomegaly. There is a 3 lead AICD. The osseous structures are unremarkable. IMPRESSION: Cardiomegaly with pulmonary vascular congestion. Electronically Signed   By: Kathreen Devoid   On: 07/07/2016 10:40   Dg C-arm 61-120 Min  Result Date: 07/07/2016 CLINICAL DATA:  Status post ORIF of left hip. EXAM: DG C-ARM 61-120 MIN; OPERATIVE LEFT HIP WITH PELVIS COMPARISON:  None. FINDINGS: Seven images of the left femur were obtained via portable C-arm radiography and submitted for interpretation. Images show open reduction and hardware fixation of the intertrochanteric fracture involving the proximal left femur. An intra medullary rod and hip screw has been placed. IMPRESSION: 1. Status post ORIF of the proximal left femur. Electronically Signed   By: Kerby Moors M.D.   On: 07/07/2016 14:46   Dg Hip Operative Unilat With Pelvis Left  Result Date: 07/07/2016 CLINICAL DATA:  Status post ORIF of left hip. EXAM: DG C-ARM 61-120 MIN; OPERATIVE LEFT HIP WITH PELVIS COMPARISON:  None. FINDINGS: Seven images of the left femur were obtained via portable C-arm radiography and submitted for interpretation. Images show open reduction and hardware fixation of the intertrochanteric fracture involving the proximal left femur. An intra medullary rod and hip screw has been placed. IMPRESSION: 1. Status post ORIF of the proximal left femur. Electronically Signed   By: Kerby Moors M.D.   On: 07/07/2016 14:46    ASSESSMENT AND PLAN:   Active Problems:   Hip fracture (HCC)  #1 Left Hip fracture- secondary to fall. Orthopedics consulted. -Patient is moderate to high risk for surgery -Cardiology consult requested for cardiac clearance. INR is elevated, so held Coumadin. -One dose of oral vitamin K and INR is still >2, given one dose [place] vit K .  -Explained risks to family and also discussed with orthopedic surgeon -Pain medication.  Patient has high tolerance to pain medications as he is on chronic pain medications for his back pain  planned for sx later 07/07/16  #2 ARF on CKD stage 4- baseline creatinine at 2.2. On Diuril takes at home. -Hold Lasix and metolazone. Nephrology consulted. -Gentle hydration today in follow-up. Renally adjust all medications. -Check SPEP and UPEP and PTH as calcium also elevated. - appreciated nephrology help. worsening renal func.  #3 Chronic systolic CHF- well compensated, last known EF 25% - Chronic lower extremity edema. Held Lasix and metolazone - now ac on ch systolic chf   No lasix for now as renal func worsening, and question of how much he will response.   I discussed with Dr. Candiss Norse, likely secondary to very Low EF, and pt may need to go for HD.   Will check post op.  #4 Afib- rate controlled, on coreg-  - hold warfarin, s/p pacemaker  Hold COREG due to borderline BP.  #5 Gout-continue home medications  #6 BPH- on flomax  #7 DVT Prophylaxis- now INR at 2, start after the surgery  #8 meningioma   incindental finding, discussed with his daughter in room.  # 9 hematuria  and UTI   Ur cx, Rocephin.   Monitor CBC. Hb stable.  All the records are reviewed and case discussed with Care Management/Social Workerr. Management plans discussed with the patient, family and they are in agreement.  CODE STATUS: Full  TOTAL TIME TAKING CARE OF THIS PATIENT:  35 minutes.  Discussed with 2 daughters and Dr. Candiss Norse.  POSSIBLE D/C IN 3-4 DAYS, DEPENDING ON CLINICAL CONDITION.   Vaughan Basta M.D on 07/07/2016   Between 7am to 6pm - Pager - [phone]  After 6pm go to [email or web address] - password [email or web address] [facility]  Office  [phone]  CC: Primary care physician; [name], MD  Note: This dictation was prepared with Dragon dictation along with smaller phrase technology. Any transcriptional errors that result from this process are  unintentional.

## 2016-07-07 NOTE — Progress Notes (Signed)
Restarted neo at 54mcg/min blood pressure 84/44

## 2016-07-07 NOTE — Progress Notes (Signed)
Dr Marcello Moores in to see pt

## 2016-07-07 NOTE — Care Management Important Message (Signed)
Important Message  Patient Details  Name: Micheal [name] MRN: [id number] Date of Birth: July 19, 1941   Medicare Important Message Given:  Yes    Jolly Mango, RN 07/07/2016, 10:34 AM

## 2016-07-07 NOTE — Progress Notes (Signed)
*  PRELIMINARY RESULTS* Echocardiogram 2D Echocardiogram has been performed.  Micheal [name] 07/07/2016, 9:18 AM

## 2016-07-07 NOTE — Anesthesia Procedure Notes (Signed)
Date/Time: 07/07/2016 12:46 PM Performed by: Allean Found Pre-anesthesia Checklist: Patient identified, Emergency Drugs available, Suction available, Patient being monitored and Timeout performed Patient Re-evaluated:Patient Re-evaluated prior to inductionOxygen Delivery Method: Nasal cannula, Circle system utilized and Simple face mask Comments: Pt came in on 6l [place], after sedation, sat to 89%, pt breathing, put circuit mask on face at 6l/min for higher concentration, after sat resolved into high 90's, simple mask applied with ETCO2 monitior

## 2016-07-07 NOTE — Progress Notes (Signed)
Neo drip started  At 39mcg/min  And will titrate for SBP  OF 85

## 2016-07-07 NOTE — Progress Notes (Signed)
Off neo drip

## 2016-07-07 NOTE — Care Management (Addendum)
For surgery today. Plan is transfer to SNF next week.

## 2016-07-07 NOTE — Progress Notes (Signed)
Pt seen by for POC for hip surgery IMP AFIB CM CHF AICD HTN CRI Resp Failure chronic Hip Fx pre-op . PLAN Continue hold coumadin  INR should be <1.8 Continue coreg pre and post op Pt is an acceptable moderate  risk for surgery Full note to follow [facility] /Caardiology

## 2016-07-07 NOTE — Progress Notes (Signed)
Blood pressure now 100/67 on 55mcg/min

## 2016-07-07 NOTE — Anesthesia Preprocedure Evaluation (Signed)
Anesthesia Evaluation  Patient identified by MRN, date of birth, ID band Patient awake    Reviewed: Allergy & Precautions, H&P , NPO status , Patient's Chart, lab work & pertinent test results, reviewed documented beta blocker date and time   Airway Mallampati: II  TM Distance: >3 FB Neck ROM: full    Dental no notable dental hx. (+) Teeth Intact   Pulmonary neg pulmonary ROS, former smoker,    Pulmonary exam normal breath sounds clear to auscultation       Cardiovascular Exercise Tolerance: Good hypertension, + CAD and +CHF  negative cardio ROS  + dysrhythmias  Rhythm:regular Rate:Normal  Echo pending   Neuro/Psych PSYCHIATRIC DISORDERS Anxiety negative neurological ROS  negative psych ROS   GI/Hepatic negative GI ROS, Neg liver ROS,   Endo/Other  negative endocrine ROSdiabetes  Renal/GU CRF and ARFRenal disease     Musculoskeletal   Abdominal   Peds  Hematology negative hematology ROS (+)   Anesthesia Other Findings   Reproductive/Obstetrics negative OB ROS                            Anesthesia Physical Anesthesia Plan  ASA: IV and emergent  Anesthesia Plan: MAC   Post-op Pain Management:    Induction:   Airway Management Planned:   Additional Equipment:   Intra-op Plan:   Post-operative Plan:   Informed Consent: I have reviewed the patients History and Physical, chart, labs and discussed the procedure including the risks, benefits and alternatives for the proposed anesthesia with the patient or authorized representative who has indicated his/her understanding and acceptance.     Plan Discussed with: CRNA  Anesthesia Plan Comments:         Anesthesia Quick Evaluation

## 2016-07-07 NOTE — Progress Notes (Signed)
Of neo drip

## 2016-07-07 NOTE — Progress Notes (Signed)
Neo drip down to  14mcg/min

## 2016-07-07 NOTE — Progress Notes (Signed)
Per RN in progression rounds patient will have surgery today. Plan is for patient to D/C to [facility] Monday 07/10/16 pending medical clearance. Patient's daughters are aware of above. [facility] admissions coordinator at [facility] is aware of above. Clinical Social Worker (CSW) will continue to follow and assist as needed.   [employer], LCSW [phone]

## 2016-07-07 NOTE — Progress Notes (Signed)
Subjective:   Underwent Left hip fracture Intramed nail today Post surgery patient was hypotensive and placed on pressors;  Transferred to ICU for close monitoring  Objective:  Vital signs in last 24 hours:  Temp:  [97.3 F (36.3 C)-98.8 F (37.1 C)] 98.7 F (37.1 C) (10/13 1722) Pulse Rate:  [74-77] 75 (10/13 1722) Resp:  [13-21] 21 (10/13 1722) BP: (72-103)/(48-70) 100/70 (10/13 1722) SpO2:  [93 %-100 %] 100 % (10/13 1722) Weight:  [110.2 kg (243 lb)-111.5 kg (245 lb 13 oz)] 111.5 kg (245 lb 13 oz) (10/13 1722)  Weight change:  Filed Weights   07/05/16 0746 07/07/16 1209 07/07/16 1722  Weight: 110.2 kg (243 lb) 110.2 kg (243 lb) 111.5 kg (245 lb 13 oz)    Intake/Output:    Intake/Output Summary (Last 24 hours) at 07/07/16 1805 Last data filed at 07/07/16 1601  Gross per 24 hour  Intake              475 ml  Output              650 ml  Net             -175 ml     Physical Exam: General: Chronically ill-appearing, lying in the bed  HEENT Anicteric, moist mucous membranes  Neck supple  Pulm/lungs Normal breathing effort, decreased breath sounds at bases  CVS/Heart  no rub or gallop, paced rhythm  Abdomen:  Soft, nontender, nondistended  Extremities: 1+ pitting edema bilaterally, tight edema  Neurologic: Alert, able to answer questions, and limited mobility of the legs  Skin: Dry peeling skin over both lower legs and feet   Foley in place       Basic Metabolic Panel:   Recent Labs Lab 07/05/16 0756 07/06/16 0419 07/07/16 0427  NA 136 140 136  K 3.9 3.5 3.8  CL 94* 97* 97*  CO2 30 31 29   GLUCOSE 165* 158* 132*  BUN 86* 81* 91*  CREATININE 3.62* 3.37* 4.02*  CALCIUM 9.9 9.6 9.0     CBC:  Recent Labs Lab 07/05/16 0756 07/06/16 0419 07/06/16 2042 07/07/16 0427  WBC 9.8 12.9* 17.7* 15.0*  NEUTROABS 8.7*  --   --   --   HGB 12.0* 12.2* 9.8* 9.8*  HCT 35.2* 36.3* 29.6* 29.0*  MCV 89.0 88.5 89.2 89.4  PLT 159 176 124* 128*       Microbiology:  Recent Results (from the past 720 hour(s))  Urine culture     Status: Abnormal (Preliminary result)   Collection Time: 07/05/16  7:59 AM  Result Value Ref Range Status   Specimen Description URINE, RANDOM  Final   Special Requests NONE  Final   Culture >=100,000 COLONIES/mL MORGANELLA MORGANII (A)  Final   Report Status PENDING  Incomplete  Surgical PCR screen     Status: Abnormal   Collection Time: 07/05/16  1:18 PM  Result Value Ref Range Status   MRSA, PCR POSITIVE (A) NEGATIVE Final    Comment: RESULT CALLED TO, READ BACK BY AND VERIFIED WITH: ERIN CARTER ON 07/05/16 AT 1553 [place]    Staphylococcus aureus POSITIVE (A) NEGATIVE Final    Comment:        The Xpert SA Assay (FDA approved for NASAL specimens in patients over 25 years of age), is one component of a comprehensive surveillance program.  Test performance has been validated by [facility] for patients greater than or equal to 46 year old. It is not intended to diagnose infection  nor to guide or monitor treatment.     Coagulation Studies:  Recent Labs  07/05/16 0756 07/06/16 0419 07/07/16 0427  LABPROT 23.1* 23.1* 21.0*  INR 2.01 2.01 1.79    Urinalysis:  Recent Labs  07/05/16 0759 07/06/16 1255  COLORURINE STRAW* YELLOW*  LABSPEC 1.009 1.013  PHURINE 7.0 6.0  GLUCOSEU 150* NEGATIVE  HGBUR 3+* 3+*  BILIRUBINUR NEGATIVE NEGATIVE  KETONESUR NEGATIVE NEGATIVE  PROTEINUR 30* 100*  NITRITE NEGATIVE NEGATIVE  LEUKOCYTESUR 1+* 3+*      Imaging: Dg Chest 1 View  Result Date: 07/07/2016 CLINICAL DATA:  Hypoxia, shortness of breath EXAM: CHEST 1 VIEW COMPARISON:  07/05/2016 FINDINGS: There is bilateral interstitial thickening and prominence of the central pulmonary vasculature. There is no focal parenchymal opacity. There is no pleural effusion or pneumothorax. There is stable cardiomegaly. There is a 3 lead AICD. The osseous structures are unremarkable. IMPRESSION:  Cardiomegaly with pulmonary vascular congestion. Electronically Signed   By: Kathreen Devoid   On: 07/07/2016 10:40   Dg C-arm 61-120 Min  Result Date: 07/07/2016 CLINICAL DATA:  Status post ORIF of left hip. EXAM: DG C-ARM 61-120 MIN; OPERATIVE LEFT HIP WITH PELVIS COMPARISON:  None. FINDINGS: Seven images of the left femur were obtained via portable C-arm radiography and submitted for interpretation. Images show open reduction and hardware fixation of the intertrochanteric fracture involving the proximal left femur. An intra medullary rod and hip screw has been placed. IMPRESSION: 1. Status post ORIF of the proximal left femur. Electronically Signed   By: Kerby Moors M.D.   On: 07/07/2016 14:46   Dg Hip Operative Unilat With Pelvis Left  Result Date: 07/07/2016 CLINICAL DATA:  Status post ORIF of left hip. EXAM: DG C-ARM 61-120 MIN; OPERATIVE LEFT HIP WITH PELVIS COMPARISON:  None. FINDINGS: Seven images of the left femur were obtained via portable C-arm radiography and submitted for interpretation. Images show open reduction and hardware fixation of the intertrochanteric fracture involving the proximal left femur. An intra medullary rod and hip screw has been placed. IMPRESSION: 1. Status post ORIF of the proximal left femur. Electronically Signed   By: Kerby Moors M.D.   On: 07/07/2016 14:46     Medications:   . sodium chloride 75 mL/hr at 07/07/16 1728   . calcitRIOL  0.25 mcg Oral Daily  .  ceFAZolin (ANCEF) IV  2 g Intravenous Q6H  . cefTRIAXone (ROCEPHIN)  IV  1 g Intravenous Q24H  . Chlorhexidine Gluconate Cloth  6 each Topical Q0600  . citalopram  40 mg Oral Daily  . docusate sodium  100 mg Oral BID  . febuxostat  40 mg Oral QHS  . ipratropium-albuterol  3 mL Nebulization Q4H  . mupirocin ointment  1 application Nasal BID  . oxyCODONE  15 mg Oral Q12H  . polyethylene glycol  17 g Oral Daily  . pravastatin  80 mg Oral q1800  . tamsulosin  0.4 mg Oral Daily  . [START ON  07/08/2016] warfarin  5 mg Oral q1800  . Warfarin - Physician Dosing Inpatient   Does not apply q1800   acetaminophen **OR** acetaminophen, ALPRAZolam, alum & mag hydroxide-simeth, bisacodyl, cyclobenzaprine, guaiFENesin-dextromethorphan, HYDROcodone-acetaminophen, HYDROmorphone (DILAUDID) injection, magnesium citrate, magnesium hydroxide, menthol-cetylpyridinium **OR** phenol, metoCLOPramide **OR** metoCLOPramide (REGLAN) injection, ondansetron **OR** ondansetron (ZOFRAN) IV  Assessment/ Plan:  75 y.o.caucasian male with hypertension, morbid obesity, gout, history of nonsteroidal use, chronic atrial fibrillation, hyperlipidemia,diabetes, depression, ICD, congestive heart failure with LVEF 25%, chronic kidney disease now presents with comminuted intertrochanteric  fracture of the left femur  1.  Acute renal failure on chronic kidney disease stage IV Cause of ARF not entirely clear but likely secondary to volume depletion Serum creatinine has started to improve with IV hydration Continue IV fluids at the current rate Agree with holding diuretics Electrolytes and Volume status are acceptable No acute indication for Dialysis at present  Careful volume repletion as patient has LVEF < 25%  2. Hematuria Detected on urinalysis at admission Urine P/C ratio 0.5  3.  Lower extremity edema Acceptable control at present .    LOS: 2 Harding Thomure 10/13/20176:05 PM

## 2016-07-07 NOTE — Progress Notes (Signed)
Neo drip down to 69mcg/min

## 2016-07-07 NOTE — Progress Notes (Signed)
Dr Marcello Moores in see pt titrating neo drip to maintain SBP of 80 to 85   Blood pressure at present 86/60

## 2016-07-07 NOTE — Progress Notes (Signed)
Awaiting neo drip placed in [place] at present

## 2016-07-07 NOTE — Op Note (Signed)
07/05/2016 - 07/07/2016  2:19 PM  PATIENT:  Micheal [name]  75 y.o. male  PRE-OPERATIVE DIAGNOSIS:  LEFT HIP FRACTURE comminuted unstable intertrochanteric  POST-OPERATIVE DIAGNOSIS:  LEFT HIP FRACTURE  PROCEDURE:  Procedure(s): INTRAMEDULLARY (IM) NAIL INTERTROCHANTRIC (Left)  SURGEON: Laurene Footman, MD  ASSISTANTS: None  ANESTHESIA:   local and MAC  EBL:  Total I/O In: 400 [I.V.:400] Out: 200 [Urine:200]  BLOOD ADMINISTERED:none  DRAINS: none   LOCAL MEDICATIONS USED:  LIDOCAINE   SPECIMEN:  No Specimen  DISPOSITION OF SPECIMEN:  N/A  COUNTS:  YES  TOURNIQUET:  * No tourniquets in log *  IMPLANTS: Biomet left 11 x 3 80 affixes rod with 115 mm lag screw 52 mm interlocking screw  DICTATION: .Dragon Dictation patient brought the operating room and after adequate sedation was obtained the patient was transferred to the fracture table where C-arm was brought in and adequate visualization was obtained of the proximal femur. The fracture was not anatomically reduced. Despite significant traction. The fracture remained in some varus. After prepping draping the sterile fashion appropriate patient identification and timeout procedures were completed. Proximal incision was made at the level of the greater trochanter and a guidewire inserted this actually went through the fracture site as the greater trochanter is not really excess accessible with patient's size and fracture pattern. The guidewire was inserted down the canal after proximal reaming and rod measurement obtained. The rod was then inserted after reaming insert with 13 mm and the leg was then brought into abduction to try to correct some varus deformity with this done in the rod at the right depth a center center position was obtained for the lag screw. The guidewires inserted in a center center position drilled and then the leg screw was inserted. After the leg screw was placed in the appropriate position traction was  released from the leg and compression applied through the device. The insertion device was then removed. AP lateral images obtained next going distally the single distal interlocking screw was placed using standard technique of getting the proximal circles making a small incision drilling and placing the 52 mm screw the wounds were then irrigated and closed with 2-0 Vicryl subcutaneously and skin staples. The proximal incision was infiltrated with TXA to try to minimize postoperative bleeding  PLAN OF CARE: Continue as inpatient  PATIENT DISPOSITION:  PACU - hemodynamically stable.

## 2016-07-07 NOTE — Transfer of Care (Signed)
Immediate Anesthesia Transfer of Care Note  Patient: Micheal [name]  Procedure(s) Performed: Procedure(s): INTRAMEDULLARY (IM) NAIL INTERTROCHANTRIC (Left)  Patient Location: PACU  Anesthesia Type:General  Level of Consciousness: awake  Airway & Oxygen Therapy: Patient Spontanous Breathing and Patient connected to face mask oxygen  Post-op Assessment: Report given to RN and Post -op Vital signs reviewed and stable  Post vital signs: Reviewed and stable  Last Vitals:  Vitals:   07/07/16 0742 07/07/16 1209  BP: (!) 97/56 (!) 101/57  Pulse: 74 74  Resp: 18 18  Temp: 37.1 C 36.6 C    Last Pain:  Vitals:   07/07/16 1209  TempSrc: Tympanic  PainSc: 3       Patients Stated Pain Goal: 1 ([phone] [phone])  Complications: No apparent anesthesia complications

## 2016-07-08 LAB — BASIC METABOLIC PANEL
ANION GAP: 9 (ref 5–15)
BUN: 96 mg/dL — ABNORMAL HIGH (ref 6–20)
CO2: 30 mmol/L (ref 22–32)
Calcium: 9.2 mg/dL (ref 8.9–10.3)
Chloride: 98 mmol/L — ABNORMAL LOW (ref 101–111)
Creatinine, Ser: 4.08 mg/dL — ABNORMAL HIGH (ref 0.61–1.24)
GFR, EST AFRICAN AMERICAN: 15 mL/min — AB (ref >60)
GFR, EST NON AFRICAN AMERICAN: 13 mL/min — AB (ref >60)
Glucose, Bld: 129 mg/dL — ABNORMAL HIGH (ref 65–99)
POTASSIUM: 3.9 mmol/L (ref 3.5–5.1)
SODIUM: 137 mmol/L (ref 135–145)

## 2016-07-08 LAB — CBC
HCT: 26.5 % — ABNORMAL LOW (ref 40.0–52.0)
Hemoglobin: 8.7 g/dL — ABNORMAL LOW (ref 13.0–18.0)
MCH: 29.9 pg (ref 26.0–34.0)
MCHC: 33.1 g/dL (ref 32.0–36.0)
MCV: 90.3 fL (ref 80.0–100.0)
PLATELETS: 126 10*3/uL — AB (ref 150–440)
RBC: 2.93 MIL/uL — AB (ref 4.40–5.90)
RDW: 14 % (ref 11.5–14.5)
WBC: 13.4 10*3/uL — AB (ref 3.8–10.6)

## 2016-07-08 LAB — URINE CULTURE: Culture: >=100000 — AB

## 2016-07-08 LAB — PROTIME-INR
INR: 1.5
PROTHROMBIN TIME: 18.3 s — AB (ref 11.4–15.2)

## 2016-07-08 LAB — GLUCOSE, CAPILLARY: GLUCOSE-CAPILLARY: 141 mg/dL — AB (ref 65–99)

## 2016-07-08 MED ORDER — TRAMADOL HCL 50 MG PO TABS: 50.0000 mg | ORAL_TABLET | Freq: Two times a day (BID) | ORAL | Status: DC | PRN

## 2016-07-08 MED ORDER — WARFARIN - PHARMACIST DOSING INPATIENT
Freq: Every day | Status: DC
  Administered 2016-07-09: 18:00:00

## 2016-07-08 MED ORDER — WARFARIN SODIUM 5 MG PO TABS
5.0000 mg | ORAL_TABLET | ORAL | Status: DC
  Administered 2016-07-09 – 2016-07-10 (×2): 5 mg via ORAL
  Filled 2016-07-08 (×2): qty 1

## 2016-07-08 MED ORDER — IPRATROPIUM-ALBUTEROL 0.5-2.5 (3) MG/3ML IN SOLN
3.0000 mL | Freq: Four times a day (QID) | RESPIRATORY_TRACT | Status: DC
Start: 2016-07-09 — End: 2016-07-10
  Administered 2016-07-09 – 2016-07-10 (×5): 3 mL via RESPIRATORY_TRACT
  Filled 2016-07-08 (×5): qty 3

## 2016-07-08 MED ORDER — IPRATROPIUM-ALBUTEROL 0.5-2.5 (3) MG/3ML IN SOLN: 3.0000 mL | RESPIRATORY_TRACT | Status: DC | PRN

## 2016-07-08 MED ORDER — ENOXAPARIN SODIUM 120 MG/0.8ML ~~LOC~~ SOLN
1.0000 mg/kg | SUBCUTANEOUS | Status: DC
  Administered 2016-07-08 – 2016-07-14 (×6): 110 mg via SUBCUTANEOUS
  Filled 2016-07-08 (×8): qty 0.8

## 2016-07-08 MED ORDER — ALPRAZOLAM 0.5 MG PO TABS
0.5000 mg | ORAL_TABLET | Freq: Three times a day (TID) | ORAL | Status: DC
  Administered 2016-07-08 – 2016-07-15 (×18): 0.5 mg via ORAL
  Filled 2016-07-08 (×18): qty 1

## 2016-07-08 MED ORDER — WARFARIN SODIUM 2.5 MG PO TABS: 2.5000 mg | ORAL_TABLET | ORAL | Status: DC

## 2016-07-08 MED ORDER — HYDROCODONE-ACETAMINOPHEN 10-325 MG PO TABS
1.0000 | ORAL_TABLET | ORAL | Status: DC | PRN
  Administered 2016-07-09 – 2016-07-13 (×7): 1 via ORAL
  Filled 2016-07-08 (×8): qty 1

## 2016-07-08 MED ORDER — WARFARIN SODIUM 5 MG PO TABS
5.0000 mg | ORAL_TABLET | Freq: Once | ORAL | Status: AC
  Administered 2016-07-08: 5 mg via ORAL
  Filled 2016-07-08: qty 1

## 2016-07-08 NOTE — Care Management Note (Signed)
Case Management Note  Patient Details  Name: Micheal [name] MRN: [id number] Date of Birth: [birth year]  Subjective/Objective:     Current discharge plan is discharge to [facility] for Rehab.                Action/Plan:   Expected Discharge Date:                  Expected Discharge Plan:     In-House Referral:     Discharge planning Services     Post Acute Care Choice:    Choice offered to:     DME Arranged:    DME Agency:     HH Arranged:    HH Agency:     Status of Service:     If discussed at [employer] of Stay Meetings, dates discussed:    Additional Comments:  Robt Okuda A, RN 07/08/2016, 1:52 PM

## 2016-07-08 NOTE — Progress Notes (Signed)
[facility] NAME: Micheal [name]    MR#:  [id number]  DATE OF BIRTH:  1941-06-28  SUBJECTIVE:  CHIEF COMPLAINT:   Chief Complaint  Patient presents with  . Fall  . Leg Pain     Came with a fall and hip fracture.   Also have A fib on coumadin , and CKD.  have c/o congestion in chest.   INR is 1.5   had hypotension yesterday after surgery, now stable.  REVIEW OF SYSTEMS:   Constitutional: Positive for malaise/fatigue. Negative for chills, fever and weight loss.  HENT: Negative for ear discharge, ear pain, hearing loss and nosebleeds.   Eyes: Negative for blurred vision, double vision and photophobia.  Respiratory: Negative for cough, hemoptysis, shortness of breath and wheezing.   Cardiovascular: Positive for leg swelling. Negative for chest pain, palpitations and orthopnea.  Gastrointestinal: Negative for abdominal pain, constipation, diarrhea, heartburn, melena, nausea and vomiting.  Genitourinary: Negative for dysuria and urgency.  Musculoskeletal: Positive for falls, joint pain and myalgias. Negative for back pain and neck pain.  Skin: Negative for rash.  Neurological: Negative for dizziness, tingling, sensory change, speech change, focal weakness and headaches.  Endo/Heme/Allergies: Does not bruise/bleed easily.  Psychiatric/Behavioral: Negative for depression.   ROS  DRUG ALLERGIES:  No Known Allergies  VITALS:  Blood pressure 90/61, pulse 80, temperature 99 F (37.2 C), temperature source Oral, resp. rate 14, height 5\' 10"  (1.778 m), weight 111.5 kg (245 lb 13 oz), SpO2 97 %.  PHYSICAL EXAMINATION:   GENERAL:  75 y.o.-year-old patient lying in the bed with no acute distress. Dissheleved appearing. EYES: Pupils equal, round, reactive to light and accommodation. No scleral icterus. Extraocular muscles intact.  HEENT: Head atraumatic, normocephalic. Oropharynx and nasopharynx clear.  NECK:  Supple, no jugular venous  distention. No thyroid enlargement, no tenderness.  LUNGS: Normal breath sounds bilaterally, no wheezing, rales,rhonchi or crepitation. No use of accessory muscles of respiration. Decreased bibasilar breath sounds. Some coarse coughing during my exam. CARDIOVASCULAR: S1, S2 normal. No  rubs, or gallops. 3/6 systolic murmur in place ABDOMEN: Soft, nontender, nondistended. Bowel sounds present. No organomegaly or mass.  EXTREMITIES: Left leg is abducted and externally rotated. Intact neurovascular bundle. No cyanosis, or clubbing. 2+ lower extremity edema with chronic pigmentation changes NEUROLOGIC: Cranial nerves II through XII are intact. Muscle strength 4/5 in all extremities. Sensation intact. Gait not checked.  PSYCHIATRIC: The patient is alert and oriented x 3.  SKIN: No obvious rash, lesion, or ulcer.   Physical Exam LABORATORY PANEL:   CBC  Recent Labs Lab 07/08/16 0412  WBC 13.4*  HGB 8.7*  HCT 26.5*  PLT 126*   ------------------------------------------------------------------------------------------------------------------  Chemistries   Recent Labs Lab 07/05/16 0756  07/08/16 0412  NA 136  < > 137  K 3.9  < > 3.9  CL 94*  < > 98*  CO2 30  < > 30  GLUCOSE 165*  < > 129*  BUN 86*  < > 96*  CREATININE 3.62*  < > 4.08*  CALCIUM 9.9  < > 9.2  AST 19  --   --   ALT 15*  --   --   ALKPHOS 58  --   --   BILITOT 0.7  --   --   < > = values in this interval not displayed. ------------------------------------------------------------------------------------------------------------------  Cardiac Enzymes No results for input(s): TROPONINI in the last 168 hours. ------------------------------------------------------------------------------------------------------------------  RADIOLOGY:  Dg Chest 1  View  Result Date: 07/07/2016 CLINICAL DATA:  Hypoxia, shortness of breath EXAM: CHEST 1 VIEW COMPARISON:  07/05/2016 FINDINGS: There is bilateral interstitial thickening  and prominence of the central pulmonary vasculature. There is no focal parenchymal opacity. There is no pleural effusion or pneumothorax. There is stable cardiomegaly. There is a 3 lead AICD. The osseous structures are unremarkable. IMPRESSION: Cardiomegaly with pulmonary vascular congestion. Electronically Signed   By: Kathreen Devoid   On: 07/07/2016 10:40   Ct Head Wo Contrast  Result Date: 07/07/2016 CLINICAL DATA:  Initial evaluation for pupil asymmetry following hip surgery today. EXAM: CT HEAD WITHOUT CONTRAST TECHNIQUE: Contiguous axial images were obtained from the base of the skull through the vertex without intravenous contrast. COMPARISON:  Prior CT from 07/05/2016. FINDINGS: Brain: Generalized cerebral atrophy with moderate chronic microvascular ischemic changes again noted. Scattered remote lacunar infarcts noted within the bilateral basal ganglia, stable. No acute intracranial hemorrhage. No evidence for acute large vessel territory infarct. Gray-white matter deficient maintained. Deep gray nuclei maintained. No insular ribbon sign. 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 No associated edema. No other mass lesion. No midline shift or mass effect. No hydrocephalus. No extra-axial fluid collection. Vascular: No hyperdense vessel. Scattered vascular calcifications present within the carotid siphons. Skull: Scalp soft tissues within normal limits.  Calvarium intact. Sinuses/Orbits: Globes and oral soft tissues within normal limits. Paranasal sinuses are clear. Trace right mastoid effusion. Left mastoid air cells clear. IMPRESSION: 1. No acute intracranial process identified. 2. Stable atrophy with chronic microvascular ischemic disease. Electronically Signed   By: Jeannine Boga M.D.   On: 07/07/2016 22:28   Dg C-arm 61-120 Min  Result Date: 07/07/2016 CLINICAL DATA:  Status post ORIF of left hip. EXAM: DG C-ARM 61-120 MIN; OPERATIVE LEFT HIP WITH PELVIS COMPARISON:  None.  FINDINGS: Seven images of the left femur were obtained via portable C-arm radiography and submitted for interpretation. Images show open reduction and hardware fixation of the intertrochanteric fracture involving the proximal left femur. An intra medullary rod and hip screw has been placed. IMPRESSION: 1. Status post ORIF of the proximal left femur. Electronically Signed   By: Kerby Moors M.D.   On: 07/07/2016 14:46   Dg Hip Operative Unilat With Pelvis Left  Result Date: 07/07/2016 CLINICAL DATA:  Status post ORIF of left hip. EXAM: DG C-ARM 61-120 MIN; OPERATIVE LEFT HIP WITH PELVIS COMPARISON:  None. FINDINGS: Seven images of the left femur were obtained via portable C-arm radiography and submitted for interpretation. Images show open reduction and hardware fixation of the intertrochanteric fracture involving the proximal left femur. An intra medullary rod and hip screw has been placed. IMPRESSION: 1. Status post ORIF of the proximal left femur. Electronically Signed   By: Kerby Moors M.D.   On: 07/07/2016 14:46    ASSESSMENT AND PLAN:   Active Problems:   Hip fracture (HCC)  #1 Left Hip fracture- secondary to fall. Orthopedics consulted. - Patient is moderate to high risk for surgery - Cardiology consult requested for cardiac clearance. INR is elevated, so held Coumadin. - One dose of oral vitamin K , given one dose  vit K .  - Explained risks to family and also discussed with orthopedic surgeon - Pain medication. Patient has high tolerance to pain medications as he is on chronic pain medications for his back pain  planned for sx later 07/07/16  #2 ARF on CKD stage 4- baseline creatinine at 2.2. On Diuril takes at home. - Hold  Lasix and metolazone. Nephrology consulted. - Gentle hydration today in follow-up. Renally adjust all medications. - Checked SPEP and UPEP and PTH as calcium also elevated. - appreciated nephrology help. worsening renal func. - no need for HD yet.  #3  Ac on Chronic systolic CHF- well compensated, EF is 25% - Chronic lower extremity edema. Held Lasix and metolazone   No lasix for now as renal func worsening, and question of how much he will response.   I discussed with Dr. Candiss Norse, likely secondary to very Low EF, low volume,.   Appears stable so far.  On Gentle IV hydration to help renal func.  #4 Afib- rate controlled, on coreg-  - hold warfarin, s/p pacemaker  Hold COREG due to borderline BP.  Now will start on therapeutic lovenox.  #5 Gout-continue home medications  #6 BPH- on flomax  #7 DVT Prophylaxis- now INR at 2, start after the surgery  #8 meningioma   incindental finding, discussed with his daughter in room.  # 9 hematuria  and UTI   Ur cx, Rocephin.   Monitor CBC. Hb stable.  #10 had ome concern about pupil size yesterday after sx, CT head normal.   No findings now.  All the records are reviewed and case discussed with Care Management/Social Workerr. Management plans discussed with the patient, family and they are in agreement.  CODE STATUS: Full  TOTAL TIME TAKING CARE OF THIS PATIENT: 35 minutes.  Discussed with 2 daughters and Dr. Candiss Norse.  POSSIBLE D/C IN 3-4 DAYS, DEPENDING ON CLINICAL CONDITION.   Vaughan Basta M.D on 07/08/2016   Between 7am to 6pm - Pager - [phone]  After 6pm go to [email or web address] - password [email or web address] [facility]  Office  [phone]  CC: Primary care physician; [name], MD  Note: This dictation was prepared with Dragon dictation along with smaller phrase technology. Any transcriptional errors that result from this process are unintentional.

## 2016-07-08 NOTE — Consult Note (Signed)
ANTICOAGULATION CONSULT NOTE - Initial Consult  Pharmacy Consult for enoxaparin Indication: atrial fibrillation  No Known Allergies  Patient Measurements: Height: 5\' 10"  (177.8 cm) Weight: 245 lb 13 oz (111.5 kg) IBW/kg (Calculated) : 73 Heparin Dosing Weight:   Vital Signs: Temp: 99 F (37.2 C) (10/14 0400) Temp Source: Oral (10/14 0400) BP: 90/61 (10/14 0600) Pulse Rate: 80 (10/14 0600)  Labs:  Recent Labs  07/06/16 0419 07/06/16 2042 07/07/16 0427 07/08/16 0412 07/08/16 0835  HGB 12.2* 9.8* 9.8* 8.7*  --   HCT 36.3* 29.6* 29.0* 26.5*  --   PLT 176 124* 128* 126*  --   LABPROT 23.1*  --  21.0*  --  18.3*  INR 2.01  --  1.79  --  1.50  CREATININE 3.37*  --  4.02* 4.08*  --     Estimated Creatinine Clearance: 20.2 mL/min (by C-G formula based on SCr of 4.08 mg/dL (H)).   Medical History: Past Medical History:  Diagnosis Date  . Anxiety   . Atrial fibrillation ([place])   . 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 CAD (coronary artery disease)   . CHF (congestive heart failure) ([place])   . Chronic back pain   . Chronic systolic heart failure (HCC) ~2009   EF under 25%  . CKD (chronic kidney disease) stage 3, GFR 30-59 ml/min 2009   stage III  . Dyspnea    with exertion  . Fracture closed, humerus    never hadsurgery, left  . Gout, unspecified   . Hypertension   . Myocardial infarction    pt is unaware of this but doctors say he has  . Other left bundle branch block   . Thrombocytopenia (HCC)     Medications:  Scheduled:  . calcitRIOL  0.25 mcg Oral Daily  . cefTRIAXone (ROCEPHIN)  IV  1 g Intravenous Q24H  . Chlorhexidine Gluconate Cloth  6 each Topical Q0600  . citalopram  40 mg Oral Daily  . docusate sodium  100 mg Oral BID  . enoxaparin (LOVENOX) injection  1 mg/kg Subcutaneous Q24H  . febuxostat  40 mg Oral QHS  . ipratropium-albuterol  3 mL Nebulization Q4H  . mupirocin ointment  1 application Nasal BID  .  polyethylene glycol  17 g Oral Daily  . pravastatin  80 mg Oral q1800  . tamsulosin  0.4 mg Oral Daily  . warfarin  5 mg Oral q1800  . Warfarin - Physician Dosing Inpatient   Does not apply q1800    Assessment: Pt is a 75 year old male w/ a PMH of Afib on warfarin at home. Pt is s/p left hip surgery. INR is subtherapeutic at 1.5. Pharmacy consulted to bridge with enoxaparin. Pt is in ARF, crcl =20.63ml/min. Monitor renal function closely.   Goal of Therapy:   Monitor platelets by anticoagulation protocol: Yes   Plan:  Lovenox 1mg /kg q 24 hrs Recheck Scr in the AM.  Jacole Capley D Arend Bahl 07/08/2016,11:05 AM

## 2016-07-08 NOTE — Progress Notes (Addendum)
Received report from [employer] in ICU.

## 2016-07-08 NOTE — Consult Note (Signed)
ANTICOAGULATION CONSULT NOTE - Initial Consult  Pharmacy Consult for warfarin Indication: atrial fibrillation  No Known Allergies  Patient Measurements: Height: 5\' 10"  (177.8 cm) Weight: 245 lb 13 oz (111.5 kg) IBW/kg (Calculated) : 73 Heparin Dosing Weight:   Vital Signs: Temp: 99 F (37.2 C) (10/14 0400) Temp Source: Oral (10/14 0400) BP: 90/61 (10/14 0600) Pulse Rate: 80 (10/14 0600)  Labs:  Recent Labs  07/06/16 0419 07/06/16 2042 07/07/16 0427 07/08/16 0412 07/08/16 0835  HGB 12.2* 9.8* 9.8* 8.7*  --   HCT 36.3* 29.6* 29.0* 26.5*  --   PLT 176 124* 128* 126*  --   LABPROT 23.1*  --  21.0*  --  18.3*  INR 2.01  --  1.79  --  1.50  CREATININE 3.37*  --  4.02* 4.08*  --     Estimated Creatinine Clearance: 20.2 mL/min (by C-G formula based on SCr of 4.08 mg/dL (H)).   Medical History: Past Medical History:  Diagnosis Date  . Anxiety   . Atrial fibrillation ([place])   . 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 CAD (coronary artery disease)   . CHF (congestive heart failure) ([place])   . Chronic back pain   . Chronic systolic heart failure (HCC) ~2009   EF under 25%  . CKD (chronic kidney disease) stage 3, GFR 30-59 ml/min 2009   stage III  . Dyspnea    with exertion  . Fracture closed, humerus    never hadsurgery, left  . Gout, unspecified   . Hypertension   . Myocardial infarction    pt is unaware of this but doctors say he has  . Other left bundle branch block   . Thrombocytopenia (HCC)     Medications:  Scheduled:  . calcitRIOL  0.25 mcg Oral Daily  . cefTRIAXone (ROCEPHIN)  IV  1 g Intravenous Q24H  . Chlorhexidine Gluconate Cloth  6 each Topical Q0600  . citalopram  40 mg Oral Daily  . docusate sodium  100 mg Oral BID  . enoxaparin (LOVENOX) injection  1 mg/kg Subcutaneous Q24H  . febuxostat  40 mg Oral QHS  . ipratropium-albuterol  3 mL Nebulization Q4H  . mupirocin ointment  1 application Nasal BID  . polyethylene  glycol  17 g Oral Daily  . pravastatin  80 mg Oral q1800  . tamsulosin  0.4 mg Oral Daily  . [START ON 07/11/2016] warfarin  2.5 mg Oral Once per day on Tue Thu Sat  . [START ON 07/09/2016] warfarin  5 mg Oral Once per day on Sun Mon Wed Fri  . warfarin  5 mg Oral ONCE-1800  . Warfarin - Pharmacist Dosing Inpatient   Does not apply q1800    Assessment: Pt is a 75 year old male w/ a PMH of Afib on warfarin at home. Pt is s/p left hip surgery. INR is subtherapeutic at 1.5. Pt received a total of 10mg  vit K (5mg  po, 5mg  Iv). Pharmacy consulted to restart warfarin. Pt home dose is 5mg  Sun,MWF and 2.5 T,T,sat. Pt last warfarin dose was 10/10  Goal of Therapy:  INR 2-3  Monitor platelets by anticoagulation protocol: Yes   Plan: Will give 5mg  tonight and then resume pt normal home dose tomorrow.  Check INR in the AM.  Amoria Mclees D Radie Berges 07/08/2016,11:49 AM

## 2016-07-08 NOTE — Progress Notes (Signed)
Report given to [facility] RN 1A

## 2016-07-08 NOTE — Progress Notes (Signed)
Subjective:   Underwent surgery for Left hip fracture on 07/08/16 Post surgery patient was hypotensive and placed on pressors;  Transferred to ICU for close monitoring Doing better today  Objective:  Vital signs in last 24 hours:  Temp:  [97.3 F (36.3 C)-99 F (37.2 C)] 99 F (37.2 C) (10/14 0400) Pulse Rate:  [73-80] 80 (10/14 0600) Resp:  [13-21] 14 (10/14 0600) BP: (72-105)/(48-70) 90/61 (10/14 0600) SpO2:  [93 %-100 %] 97 % (10/14 0730) Weight:  [110.2 kg (243 lb)-111.5 kg (245 lb 13 oz)] 111.5 kg (245 lb 13 oz) (10/13 1722)  Weight change:  Filed Weights   07/05/16 0746 07/07/16 1209 07/07/16 1722  Weight: 110.2 kg (243 lb) 110.2 kg (243 lb) 111.5 kg (245 lb 13 oz)    Intake/Output:    Intake/Output Summary (Last 24 hours) at 07/08/16 1157 Last data filed at 07/08/16 0616  Gross per 24 hour  Intake             1799 ml  Output              975 ml  Net              824 ml     Physical Exam: General: Chronically ill-appearing, lying in the bed  HEENT Anicteric, moist mucous membranes  Neck supple  Pulm/lungs Normal breathing effort, decreased breath sounds at bases  CVS/Heart  no rub or gallop, paced rhythm  Abdomen:  Soft, nontender, nondistended  Extremities: 1+ pitting edema bilaterally, tight edema  Neurologic: Alert, able to answer questions, and limited mobility of the legs  Skin: Dry peeling skin over both lower legs and feet   Foley in place       Basic Metabolic Panel:   Recent Labs Lab 07/05/16 0756 07/06/16 0419 07/07/16 0427 07/08/16 0412  NA 136 140 136 137  K 3.9 3.5 3.8 3.9  CL 94* 97* 97* 98*  CO2 30 31 29 30   GLUCOSE 165* 158* 132* 129*  BUN 86* 81* 91* 96*  CREATININE 3.62* 3.37* 4.02* 4.08*  CALCIUM 9.9 9.6 9.0 9.2     CBC:  Recent Labs Lab 07/05/16 0756 07/06/16 0419 07/06/16 2042 07/07/16 0427 07/08/16 0412  WBC 9.8 12.9* 17.7* 15.0* 13.4*  NEUTROABS 8.7*  --   --   --   --   HGB 12.0* 12.2* 9.8* 9.8* 8.7*   HCT 35.2* 36.3* 29.6* 29.0* 26.5*  MCV 89.0 88.5 89.2 89.4 90.3  PLT 159 176 124* 128* 126*      Microbiology:  Recent Results (from the past 720 hour(s))  Urine culture     Status: Abnormal   Collection Time: 07/05/16  7:59 AM  Result Value Ref Range Status   Specimen Description URINE, RANDOM  Final   Special Requests NONE  Final   Culture >=100,000 COLONIES/mL MORGANELLA MORGANII (A)  Final   Report Status 07/08/2016 FINAL  Final   Organism ID, Bacteria MORGANELLA MORGANII (A)  Final      Susceptibility   Morganella morganii - MIC*    AMPICILLIN >=32 RESISTANT Resistant     CEFAZOLIN >=64 RESISTANT Resistant     CEFTRIAXONE <=1 SENSITIVE Sensitive     CIPROFLOXACIN <=0.25 SENSITIVE Sensitive     GENTAMICIN <=1 SENSITIVE Sensitive     IMIPENEM 4 SENSITIVE Sensitive     NITROFURANTOIN 64 RESISTANT Resistant     TRIMETH/SULFA <=20 SENSITIVE Sensitive     AMPICILLIN/SULBACTAM >=32 RESISTANT Resistant     PIP/TAZO <=4  SENSITIVE Sensitive     * >=100,000 COLONIES/mL MORGANELLA MORGANII  Surgical PCR screen     Status: Abnormal   Collection Time: 07/05/16  1:18 PM  Result Value Ref Range Status   MRSA, PCR POSITIVE (A) NEGATIVE Final    Comment: RESULT CALLED TO, READ BACK BY AND VERIFIED WITH: ERIN CARTER ON 07/05/16 AT 1553 [place]    Staphylococcus aureus POSITIVE (A) NEGATIVE Final    Comment:        The Xpert SA Assay (FDA approved for NASAL specimens in patients over 44 years of age), is one component of a comprehensive surveillance program.  Test performance has been validated by [facility] for patients greater than or equal to 7 year old. It is not intended to diagnose infection nor to guide or monitor treatment.     Coagulation Studies:  Recent Labs  07/06/16 0419 07/07/16 0427 07/08/16 0835  LABPROT 23.1* 21.0* 18.3*  INR 2.01 1.79 1.50    Urinalysis:  Recent Labs  07/06/16 1255  COLORURINE YELLOW*  LABSPEC 1.013  PHURINE 6.0  GLUCOSEU  NEGATIVE  HGBUR 3+*  BILIRUBINUR NEGATIVE  KETONESUR NEGATIVE  PROTEINUR 100*  NITRITE NEGATIVE  LEUKOCYTESUR 3+*      Imaging: Dg Chest 1 View  Result Date: 07/07/2016 CLINICAL DATA:  Hypoxia, shortness of breath EXAM: CHEST 1 VIEW COMPARISON:  07/05/2016 FINDINGS: There is bilateral interstitial thickening and prominence of the central pulmonary vasculature. There is no focal parenchymal opacity. There is no pleural effusion or pneumothorax. There is stable cardiomegaly. There is a 3 lead AICD. The osseous structures are unremarkable. IMPRESSION: Cardiomegaly with pulmonary vascular congestion. Electronically Signed   By: Kathreen Devoid   On: 07/07/2016 10:40   Ct Head Wo Contrast  Result Date: 07/07/2016 CLINICAL DATA:  Initial evaluation for pupil asymmetry following hip surgery today. EXAM: CT HEAD WITHOUT CONTRAST TECHNIQUE: Contiguous axial images were obtained from the base of the skull through the vertex without intravenous contrast. COMPARISON:  Prior CT from 07/05/2016. FINDINGS: Brain: Generalized cerebral atrophy with moderate chronic microvascular ischemic changes again noted. Scattered remote lacunar infarcts noted within the bilateral basal ganglia, stable. No acute intracranial hemorrhage. No evidence for acute large vessel territory infarct. Gray-white matter deficient maintained. Deep gray nuclei maintained. No insular ribbon sign. 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 No associated edema. No other mass lesion. No midline shift or mass effect. No hydrocephalus. No extra-axial fluid collection. Vascular: No hyperdense vessel. Scattered vascular calcifications present within the carotid siphons. Skull: Scalp soft tissues within normal limits.  Calvarium intact. Sinuses/Orbits: Globes and oral soft tissues within normal limits. Paranasal sinuses are clear. Trace right mastoid effusion. Left mastoid air cells clear. IMPRESSION: 1. No acute intracranial process  identified. 2. Stable atrophy with chronic microvascular ischemic disease. Electronically Signed   By: Jeannine Boga M.D.   On: 07/07/2016 22:28   Dg C-arm 61-120 Min  Result Date: 07/07/2016 CLINICAL DATA:  Status post ORIF of left hip. EXAM: DG C-ARM 61-120 MIN; OPERATIVE LEFT HIP WITH PELVIS COMPARISON:  None. FINDINGS: Seven images of the left femur were obtained via portable C-arm radiography and submitted for interpretation. Images show open reduction and hardware fixation of the intertrochanteric fracture involving the proximal left femur. An intra medullary rod and hip screw has been placed. IMPRESSION: 1. Status post ORIF of the proximal left femur. Electronically Signed   By: Kerby Moors M.D.   On: 07/07/2016 14:46   Dg Hip Operative Unilat With  Pelvis Left  Result Date: 07/07/2016 CLINICAL DATA:  Status post ORIF of left hip. EXAM: DG C-ARM 61-120 MIN; OPERATIVE LEFT HIP WITH PELVIS COMPARISON:  None. FINDINGS: Seven images of the left femur were obtained via portable C-arm radiography and submitted for interpretation. Images show open reduction and hardware fixation of the intertrochanteric fracture involving the proximal left femur. An intra medullary rod and hip screw has been placed. IMPRESSION: 1. Status post ORIF of the proximal left femur. Electronically Signed   By: Kerby Moors M.D.   On: 07/07/2016 14:46     Medications:   . sodium chloride 75 mL/hr at 07/08/16 0736  . phenylephrine (NEO-SYNEPHRINE) Adult infusion 6 mcg/min (07/08/16 0429)   . calcitRIOL  0.25 mcg Oral Daily  . cefTRIAXone (ROCEPHIN)  IV  1 g Intravenous Q24H  . Chlorhexidine Gluconate Cloth  6 each Topical Q0600  . citalopram  40 mg Oral Daily  . docusate sodium  100 mg Oral BID  . enoxaparin (LOVENOX) injection  1 mg/kg Subcutaneous Q24H  . febuxostat  40 mg Oral QHS  . ipratropium-albuterol  3 mL Nebulization Q4H  . mupirocin ointment  1 application Nasal BID  . polyethylene glycol  17  g Oral Daily  . pravastatin  80 mg Oral q1800  . tamsulosin  0.4 mg Oral Daily  . [START ON 07/11/2016] warfarin  2.5 mg Oral Once per day on Tue Thu Sat  . [START ON 07/09/2016] warfarin  5 mg Oral Once per day on Sun Mon Wed Fri  . warfarin  5 mg Oral ONCE-1800  . Warfarin - Pharmacist Dosing Inpatient   Does not apply q1800   acetaminophen **OR** acetaminophen, ALPRAZolam, alum & mag hydroxide-simeth, bisacodyl, cyclobenzaprine, guaiFENesin-dextromethorphan, HYDROcodone-acetaminophen, HYDROmorphone (DILAUDID) injection, magnesium citrate, magnesium hydroxide, menthol-cetylpyridinium **OR** phenol, metoCLOPramide **OR** metoCLOPramide (REGLAN) injection, ondansetron **OR** ondansetron (ZOFRAN) IV, traMADol  Assessment/ Plan:  75 y.o.caucasian male with hypertension, morbid obesity, gout, history of nonsteroidal use, chronic atrial fibrillation, hyperlipidemia,diabetes, depression, ICD, congestive heart failure with LVEF 25%, chronic kidney disease now presents with comminuted intertrochanteric fracture of the left femur  1.  Acute renal failure on chronic kidney disease stage IV Cause of ARF not entirely clear but likely secondary to volume depletion/UTI. S Creatinine remains critically high today BP remains borderline low. Off pressors UOP has started to improve with IV hydration Continue IV fluids at the current rate Agree with holding diuretics Electrolytes and Volume status are acceptable No acute indication for Dialysis at present  Careful volume repletion as patient has LVEF < 25%  2. Hematuria, likely from UTI Urine culture growing multidrug resistant Morganella morganii Urine P/C ratio 0.5 Abx as per IM team  3.  Lower extremity edema Acceptable control at present .    LOS: 3 Micheal [name] 10/14/201711:57 AM

## 2016-07-08 NOTE — Progress Notes (Signed)
Subjective: 1 Day Post-Op Procedure(s) (LRB): INTRAMEDULLARY (IM) NAIL INTERTROCHANTRIC (Left) Patient reports pain as mild.   Patient is well, but has had some minor complaints of hypotension Plan is to go Skilled nursing facility after hospital stay. Negative for chest pain and shortness of breath Fever: no Gastrointestinal: Positive for nausea.  Objective: Vital signs in last 24 hours: Temp:  [97.3 F (36.3 C)-99 F (37.2 C)] 99 F (37.2 C) (10/14 0400) Pulse Rate:  [73-80] 80 (10/14 0600) Resp:  [13-21] 14 (10/14 0600) BP: (72-105)/(48-70) 90/61 (10/14 0600) SpO2:  [93 %-100 %] 97 % (10/14 0730) Weight:  [110.2 kg (243 lb)-111.5 kg (245 lb 13 oz)] 111.5 kg (245 lb 13 oz) (10/13 1722)  Intake/Output from previous day:  Intake/Output Summary (Last 24 hours) at 07/08/16 0947 Last data filed at 07/08/16 0616  Gross per 24 hour  Intake             1799 ml  Output              975 ml  Net              824 ml    Intake/Output this shift: No intake/output data recorded.  Labs:  Recent Labs  07/06/16 0419 07/06/16 2042 07/07/16 0427 07/08/16 0412  HGB 12.2* 9.8* 9.8* 8.7*    Recent Labs  07/07/16 0427 07/08/16 0412  WBC 15.0* 13.4*  RBC 3.25* 2.93*  HCT 29.0* 26.5*  PLT 128* 126*    Recent Labs  07/07/16 0427 07/08/16 0412  NA 136 137  K 3.8 3.9  CL 97* 98*  CO2 29 30  BUN 91* 96*  CREATININE 4.02* 4.08*  GLUCOSE 132* 129*  CALCIUM 9.0 9.2    Recent Labs  07/07/16 0427 07/08/16 0835  INR 1.79 1.50     EXAM General - Patient is Alert, Appropriate and Oriented Extremity - Neurovascular intact Sensation intact distally Dorsiflexion/Plantar flexion intact Incision: dressing C/D/I No cellulitis present Dressing/Incision - clean, dry, no drainage Motor Function - intact, moving foot and toes well on exam.   Abdomen slightly distended, normal BS.  Past Medical History:  Diagnosis Date  . Anxiety   . Atrial fibrillation ([place])   .  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 CAD (coronary artery disease)   . CHF (congestive heart failure) ([place])   . Chronic back pain   . Chronic systolic heart failure (HCC) ~2009   EF under 25%  . CKD (chronic kidney disease) stage 3, GFR 30-59 ml/min 2009   stage III  . Dyspnea    with exertion  . Fracture closed, humerus    never hadsurgery, left  . Gout, unspecified   . Hypertension   . Myocardial infarction    pt is unaware of this but doctors say he has  . Other left bundle branch block   . Thrombocytopenia (HCC)     Assessment/Plan: 1 Day Post-Op Procedure(s) (LRB): INTRAMEDULLARY (IM) NAIL INTERTROCHANTRIC (Left) Active Problems:   Hip fracture (HCC)  Estimated body mass index is 35.27 kg/m as calculated from the following:   Height as of this encounter: 5\' 10"  (1.778 m).   Weight as of this encounter: 111.5 kg (245 lb 13 oz). Advance diet Up with therapy   Bulky dressing in place, will replace tomorrow. D/C oxycontin due to hypotension, changed frequency of Norco and added Tramadol Up with therapy, will try to get to the floor today for further treatment. Labs  from yesterday reviewed, don't see labs from today present.  DVT Prophylaxis - Coumadin and Foot Pumps Weight-Bearing as tolerated to left leg  J. Cameron Proud, PA-C [facility] Orthopaedic Surgery 07/08/2016, 9:47 AM

## 2016-07-08 NOTE — Evaluation (Signed)
Occupational Therapy Evaluation Patient Details Name: Micheal [name] MRN: [id number] DOB: March 16, 1941 Today's Date: 07/08/2016    History of Present Illness Pt. is a 75 y.o. male who was admitted to [facility] for IM nailing of a Left Intertrochanteric Hip Fx.   Clinical Impression   Pt. Is a 75 y.o. Male who was admitted to [facility] for a IM nailing of a Left Intertrochanteric Hip Fx. Pt. Presents with weakness, decreased activity tolerance, pain, andl imited ROM which hinder his ability to complete basic ADL tasks without fatigue. Pt. Could benefit from skilled OT services for ADl and IADL training, UE there. Ex, energy conservation/work simplification techniques, and pt. Education about home modification, and DME. Pt. Plans to go to SNF upon discharge, prior to returning home.     Follow Up Recommendations  SNF    Equipment Recommendations       Recommendations for Other Services PT consult     Precautions / Restrictions Precautions Precautions: None Restrictions Weight Bearing Restrictions: Yes LLE Weight Bearing: Weight bearing as tolerated                                                     ADL Overall ADL's : Needs assistance/impaired                                       General ADL Comments: Pt. education was provided about energy conservation/work simplification techniques. Pt. was provided with a visual handout.     Vision     Perception     Praxis      Pertinent Vitals/Pain Pain Assessment: 0-10 Pain Score: 4  Pain Descriptors / Indicators: Sore Pain Intervention(s): Monitored during session     Hand Dominance Right   Extremity/Trunk Assessment  Limited LUE shoulder AROM.       Communication Communication Communication: No difficulties   Cognition Arousal/Alertness: Awake/alert Behavior During Therapy: WFL for tasks assessed/performed Overall Cognitive Status: Within Functional Limits for tasks assessed                      General Comments       Exercises       Shoulder Instructions      Home Living Family/patient expects to be discharged to:: Private residence Living Arrangements: Alone Available Help at Discharge: Family Type of Home: House Home Access: Stairs to enter     [place]: One level     Bathroom Shower/Tub: Tub/shower unit Shower/tub characteristics: [place]: Shower seat          Prior Functioning/Environment Level of Independence: Independent        Comments: Pt. was on home O2 2L at night time.        OT Problem List: Decreased strength;Decreased activity tolerance;Decreased knowledge of use of DME or AE;Impaired UE functional use;Pain;Decreased safety awareness   OT Treatment/Interventions: Self-care/ADL training;Therapeutic exercise;Patient/family education;Therapeutic activities;DME and/or AE instruction;Energy conservation    OT Goals(Current goals can be found in the care plan section) Acute Rehab OT Goals Patient Stated Goal: To return home to independent living. OT Goal Formulation: With patient Potential to Achieve Goals: Good  OT Frequency: Min 1X/week   Barriers to D/C:  Co-evaluation              End of Session    Activity Tolerance: Patient limited by fatigue Patient left: in bed;with bed alarm set;with call bell/phone within reach;with family/visitor present   Time: [id number] OT Time Calculation (min): 24 min Charges:  OT General Charges $OT Visit: 1 Procedure OT Evaluation $OT Eval Moderate Complexity: 1 Procedure G-Codes:    Harrel Carina, MS, OTR/L 07/08/2016, 11:41 AM

## 2016-07-08 NOTE — Evaluation (Signed)
Physical Therapy Evaluation Patient Details Name: ARAMIS CRITCHLOW MRN: [id number] DOB: [birth year] Today's Date: 07/08/2016   History of Present Illness  Micheal [name] is a 75yo white male who comes to [facility] after sustaining a fall at home with Left hip fracture. Pt was held for a few days until medically stable/cleared for surgery by cardiology, nephrology, adn underwent Left hip ORIF/IM nailing on 10/13. Baseline level of funcitonal includes household AMB without AD, min-monA for bathing, and total assist for IADL. Pt does not tolerate long AMB distances. PMH: afib, PPM, CHF, EF: 25%, CKD-4.   Clinical Impression  Upon entry, the patient is received semirecumbent in bed, daughters present. The pt is awake, drowsy, and agreeable to participate. No acute distress noted at this time. VSS during eval. The pt is alert and oriented x3, pleasant, conversational, and following simple commands 50-75% of time, but is having some difficulty focusing. Pt received on and remaining on 2L O2, but moved to RA for evaluation, with noted saturation of >89%, and eventually returned to 1L at termination. Pt is on 2L/min O2 at baseline at home overnight only. Functional mobility assessment demonstrates moderate global weakness and LLE severe weakness/guarding, the pt now requiring Mod-max assist physical assistance for LLE exercises.   Patient presenting with impairment of strength, range of motion, balance, and activity tolerance, limiting ability to perform ADL and basic mobility at  baseline level of function. Patient will benefit from skilled intervention to address the above impairments and limitations, in order to restore to prior level of function, improve patient safety upon discharge, and to decrease falls risk.       Follow Up Recommendations SNF    Equipment Recommendations       Recommendations for Other Services       Precautions / Restrictions Precautions Precautions: Fall Restrictions Weight  Bearing Restrictions: Yes LLE Weight Bearing: Weight bearing as tolerated      Mobility  Bed Mobility               General bed mobility comments: Not attempted this session; pt still drowsy, PVC increase to 6-10 range during bed level exercises.   Transfers                    Ambulation/Gait                Stairs            Wheelchair Mobility    Modified Rankin (Stroke Patients Only)       Balance                                             Pertinent Vitals/Pain Pain Assessment: No/denies pain Pain Score: 4  Pain Descriptors / Indicators: Sore Pain Intervention(s): Monitored during session    Home Living Family/patient expects to be discharged to:: Private residence Living Arrangements: Alone Available Help at Discharge: Family Type of Home: House Home Access: Stairs to enter Entrance Stairs-Rails: Right Entrance Stairs-Number of Steps: 4 Home Layout: One level Home Equipment: Clinical cytogeneticist - 2 wheels;Cane - single point (urinal )      Prior Function Level of Independence: Independent         Comments: Pt. was on home O2 2L at night time.     Hand Dominance   Dominant Hand: Right    Extremity/Trunk Assessment  Upper Extremity Assessment: LUE deficits/detail (Right hand WFL)       LUE Deficits / Details: Impaired shoulder AROM, intact Active elbow flexion/extension.   Lower Extremity Assessment: LLE deficits/detail         Communication   Communication: Expressive difficulties (decreased speech speed, additional time and effort required)  Cognition Arousal/Alertness: Awake/alert Behavior During Therapy: WFL for tasks assessed/performed Overall Cognitive Status: Within Functional Limits for tasks assessed                      General Comments      Exercises General Exercises - Lower Extremity Ankle Circles/Pumps: AAROM;Both;15 reps;Supine Short Arc Quad: AAROM;Both;15  reps;Supine Heel Slides: AAROM;Both;15 reps;Supine Hip ABduction/ADduction: AAROM;Both;15 reps;Supine   Assessment/Plan    PT Assessment Patient needs continued PT services  PT Problem List Decreased strength;Decreased range of motion;Decreased activity tolerance;Decreased balance;Decreased mobility;Decreased knowledge of precautions;Decreased skin integrity          PT Treatment Interventions DME instruction;Gait training;Stair training;Functional mobility training;Therapeutic activities;Therapeutic exercise;Balance training;Patient/family education    PT Goals (Current goals can be found in the Care Plan section)  Acute Rehab PT Goals Patient Stated Goal: To return home to PLOF. PT Goal Formulation: With patient Time For Goal Achievement: 07/22/16 Potential to Achieve Goals: Fair    Frequency BID (as tolerated.)   Barriers to discharge Inaccessible home environment;Decreased caregiver support      Co-evaluation               End of Session Equipment Utilized During Treatment: Oxygen (returned to 1L/min after session.) Activity Tolerance: Patient tolerated treatment well;Patient limited by fatigue;Patient limited by lethargy;Treatment limited secondary to medical complications (Comment) (intermittent desaturation/PVC increases.) Patient left: in bed;with family/visitor present Nurse Communication: Other (comment) (O2 status)         Time: [id number] PT Time Calculation (min) (ACUTE ONLY): 32 min   Charges:   PT Evaluation $PT Eval High Complexity: 1 Procedure PT Treatments $Therapeutic Exercise: 8-22 mins   PT G Codes:        1:59 PM, 07/24/2016 Etta Grandchild, PT, DPT Physical Therapist - [place] [phone] (mobile)

## 2016-07-08 NOTE — Clinical Social Work Note (Signed)
CSW received a consult for possible SNF placement. The patient is already being followed by [place] department. The patient has already been assessed (see notes in chart). The patient has already selected a bed at [facility]. CSW will con't to follow pending dc needs.   Santiago Bumpers, MSW, LCSW-A [phone]

## 2016-07-09 LAB — BASIC METABOLIC PANEL
Anion gap: 8 (ref 5–15)
BUN: 93 mg/dL — AB (ref 6–20)
CHLORIDE: 99 mmol/L — AB (ref 101–111)
CO2: 29 mmol/L (ref 22–32)
CREATININE: 3.86 mg/dL — AB (ref 0.61–1.24)
Calcium: 8.8 mg/dL — ABNORMAL LOW (ref 8.9–10.3)
GFR calc non Af Amer: 14 mL/min — ABNORMAL LOW (ref >60)
GFR, EST AFRICAN AMERICAN: 16 mL/min — AB (ref >60)
GLUCOSE: 144 mg/dL — AB (ref 65–99)
Potassium: 3.6 mmol/L (ref 3.5–5.1)
Sodium: 136 mmol/L (ref 135–145)

## 2016-07-09 LAB — PROTIME-INR
INR: 1.44
Prothrombin Time: 17.7 seconds — ABNORMAL HIGH (ref 11.4–15.2)

## 2016-07-09 NOTE — Progress Notes (Signed)
Subjective: 2 Days Post-Op Procedure(s) (LRB): INTRAMEDULLARY (IM) NAIL INTERTROCHANTRIC (Left) Patient reports pain as mild.   Patient is well, but has had some minor complaints of hypotension Plan is to go Skilled nursing facility after hospital stay. Negative for chest pain and shortness of breath Fever: no Gastrointestinal: Positive for nausea.  Objective: Vital signs in last 24 hours: Temp:  [98.2 F (36.8 C)-98.6 F (37 C)] 98.6 F (37 C) (10/15 0719) Pulse Rate:  [70-82] 75 (10/15 0719) Resp:  [16-22] 18 (10/15 0719) BP: (91-113)/(52-66) 106/56 (10/15 0719) SpO2:  [95 %-99 %] 95 % (10/15 0742)  Intake/Output from previous day:  Intake/Output Summary (Last 24 hours) at 07/09/16 1010 Last data filed at 07/08/16 2000  Gross per 24 hour  Intake             1150 ml  Output              925 ml  Net              225 ml    Intake/Output this shift: No intake/output data recorded.  Labs:  Recent Labs  07/06/16 2042 07/07/16 0427 07/08/16 0412  HGB 9.8* 9.8* 8.7*    Recent Labs  07/07/16 0427 07/08/16 0412  WBC 15.0* 13.4*  RBC 3.25* 2.93*  HCT 29.0* 26.5*  PLT 128* 126*    Recent Labs  07/08/16 0412 07/09/16 0420  NA 137 136  K 3.9 3.6  CL 98* 99*  CO2 30 29  BUN 96* 93*  CREATININE 4.08* 3.86*  GLUCOSE 129* 144*  CALCIUM 9.2 8.8*    Recent Labs  07/08/16 0835 07/09/16 0420  INR 1.50 1.44     EXAM General - Patient is Alert, Appropriate and Oriented Extremity - Neurovascular intact Sensation intact distally Dorsiflexion/Plantar flexion intact Incision: scant drainage No cellulitis present Dressing/Incision - clean, dry, no drainage Motor Function - intact, moving foot and toes well on exam.   Abdomen slightly less distended compared to yesterday, normal BS.  Past Medical History:  Diagnosis Date  . Anxiety   . Atrial fibrillation ([place])   . 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 CAD (coronary artery  disease)   . CHF (congestive heart failure) ([place])   . Chronic back pain   . Chronic systolic heart failure (HCC) ~2009   EF under 25%  . CKD (chronic kidney disease) stage 3, GFR 30-59 ml/min 2009   stage III  . Dyspnea    with exertion  . Fracture closed, humerus    never hadsurgery, left  . Gout, unspecified   . Hypertension   . Myocardial infarction    pt is unaware of this but doctors say he has  . Other left bundle branch block   . Thrombocytopenia (HCC)     Assessment/Plan: 2 Days Post-Op Procedure(s) (LRB): INTRAMEDULLARY (IM) NAIL INTERTROCHANTRIC (Left) Active Problems:   Hip fracture (HCC)  Estimated body mass index is 35.27 kg/m as calculated from the following:   Height as of this encounter: 5\' 10"  (1.778 m).   Weight as of this encounter: 111.5 kg (245 lb 13 oz). Advance diet Up with therapy   Bulky dressing changed today to a honeycomb dressing Internal medicine following BP and history of CKD.  Cr and BUN decreased compared to yesterday. Up with therapy today. INR 1.44, continue to monitor daily.  DVT Prophylaxis - Coumadin and Foot Pumps Weight-Bearing as tolerated to left leg  J. Cameron Proud,  PA-C [facility] Orthopaedic Surgery 07/09/2016, 10:10 AM

## 2016-07-09 NOTE — Progress Notes (Signed)
Subjective:   Underwent surgery for Left hip fracture on 07/08/16 Post surgery patient was hypotensive and placed on pressors;  He was transferred to ICU for close monitoring  Doing better today  Objective:  Vital signs in last 24 hours:  Temp:  [98.1 F (36.7 C)-98.6 F (37 C)] 98.1 F (36.7 C) (10/15 1053) Pulse Rate:  [71-82] 71 (10/15 1053) Resp:  [17-22] 17 (10/15 1053) BP: (94-115)/(52-66) 115/63 (10/15 1053) SpO2:  [95 %-99 %] 96 % (10/15 1053)  Weight change:  Filed Weights   07/05/16 0746 07/07/16 1209 07/07/16 1722  Weight: 110.2 kg (243 lb) 110.2 kg (243 lb) 111.5 kg (245 lb 13 oz)    Intake/Output:    Intake/Output Summary (Last 24 hours) at 07/09/16 1124 Last data filed at 07/09/16 1020  Gross per 24 hour  Intake             2225 ml  Output             1050 ml  Net             1175 ml     Physical Exam: General: Chronically ill-appearing, sitting up in chair  HEENT Anicteric, moist mucous membranes  Neck supple  Pulm/lungs Normal breathing effort, decreased breath sounds at bases  CVS/Heart  no rub or gallop, paced rhythm  Abdomen:  Soft, nontender, nondistended  Extremities: 1+ pitting edema bilaterally, tight edema  Neurologic: Alert, able to answer questions, and limited mobility of the legs  Skin: Dry peeling skin over both lower legs and feet          Basic Metabolic Panel:   Recent Labs Lab 07/05/16 0756 07/06/16 0419 07/07/16 0427 07/08/16 0412 07/09/16 0420  NA 136 140 136 137 136  K 3.9 3.5 3.8 3.9 3.6  CL 94* 97* 97* 98* 99*  CO2 30 31 29 30 29   GLUCOSE 165* 158* 132* 129* 144*  BUN 86* 81* 91* 96* 93*  CREATININE 3.62* 3.37* 4.02* 4.08* 3.86*  CALCIUM 9.9 9.6 9.0 9.2 8.8*     CBC:  Recent Labs Lab 07/05/16 0756 07/06/16 0419 07/06/16 2042 07/07/16 0427 07/08/16 0412  WBC 9.8 12.9* 17.7* 15.0* 13.4*  NEUTROABS 8.7*  --   --   --   --   HGB 12.0* 12.2* 9.8* 9.8* 8.7*  HCT 35.2* 36.3* 29.6* 29.0* 26.5*  MCV  89.0 88.5 89.2 89.4 90.3  PLT 159 176 124* 128* 126*      Microbiology:  Recent Results (from the past 720 hour(s))  Urine culture     Status: Abnormal   Collection Time: 07/05/16  7:59 AM  Result Value Ref Range Status   Specimen Description URINE, RANDOM  Final   Special Requests NONE  Final   Culture >=100,000 COLONIES/mL MORGANELLA MORGANII (A)  Final   Report Status 07/08/2016 FINAL  Final   Organism ID, Bacteria MORGANELLA MORGANII (A)  Final      Susceptibility   Morganella morganii - MIC*    AMPICILLIN >=32 RESISTANT Resistant     CEFAZOLIN >=64 RESISTANT Resistant     CEFTRIAXONE <=1 SENSITIVE Sensitive     CIPROFLOXACIN <=0.25 SENSITIVE Sensitive     GENTAMICIN <=1 SENSITIVE Sensitive     IMIPENEM 4 SENSITIVE Sensitive     NITROFURANTOIN 64 RESISTANT Resistant     TRIMETH/SULFA <=20 SENSITIVE Sensitive     AMPICILLIN/SULBACTAM >=32 RESISTANT Resistant     PIP/TAZO <=4 SENSITIVE Sensitive     * >=100,000 COLONIES/mL MORGANELLA  MORGANII  Surgical PCR screen     Status: Abnormal   Collection Time: 07/05/16  1:18 PM  Result Value Ref Range Status   MRSA, PCR POSITIVE (A) NEGATIVE Final    Comment: RESULT CALLED TO, READ BACK BY AND VERIFIED WITH: ERIN CARTER ON 07/05/16 AT 1553 [facility]    Staphylococcus aureus POSITIVE (A) NEGATIVE Final    Comment:        The Xpert SA Assay (FDA approved for NASAL specimens in patients over 28 years of age), is one component of a comprehensive surveillance program.  Test performance has been validated by [facility] for patients greater than or equal to 48 year old. It is not intended to diagnose infection nor to guide or monitor treatment.     Coagulation Studies:  Recent Labs  07/07/16 0427 07/08/16 0835 07/09/16 0420  LABPROT 21.0* 18.3* 17.7*  INR 1.79 1.50 1.44    Urinalysis:  Recent Labs  07/06/16 1255  COLORURINE YELLOW*  LABSPEC 1.013  PHURINE 6.0  GLUCOSEU NEGATIVE  HGBUR 3+*  BILIRUBINUR NEGATIVE   KETONESUR NEGATIVE  PROTEINUR 100*  NITRITE NEGATIVE  LEUKOCYTESUR 3+*      Imaging: Ct Head Wo Contrast  Result Date: 07/07/2016 CLINICAL DATA:  Initial evaluation for pupil asymmetry following hip surgery today. EXAM: CT HEAD WITHOUT CONTRAST TECHNIQUE: Contiguous axial images were obtained from the base of the skull through the vertex without intravenous contrast. COMPARISON:  Prior CT from 07/05/2016. FINDINGS: Brain: Generalized cerebral atrophy with moderate chronic microvascular ischemic changes again noted. Scattered remote lacunar infarcts noted within the bilateral basal ganglia, stable. No acute intracranial hemorrhage. No evidence for acute large vessel territory infarct. Gray-white matter deficient maintained. Deep gray nuclei maintained. No insular ribbon sign. 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 No associated edema. No other mass lesion. No midline shift or mass effect. No hydrocephalus. No extra-axial fluid collection. Vascular: No hyperdense vessel. Scattered vascular calcifications present within the carotid siphons. Skull: Scalp soft tissues within normal limits.  Calvarium intact. Sinuses/Orbits: Globes and oral soft tissues within normal limits. Paranasal sinuses are clear. Trace right mastoid effusion. Left mastoid air cells clear. IMPRESSION: 1. No acute intracranial process identified. 2. Stable atrophy with chronic microvascular ischemic disease. Electronically Signed   By: Jeannine Boga M.D.   On: 07/07/2016 22:28   Dg C-arm 61-120 Min  Result Date: 07/07/2016 CLINICAL DATA:  Status post ORIF of left hip. EXAM: DG C-ARM 61-120 MIN; OPERATIVE LEFT HIP WITH PELVIS COMPARISON:  None. FINDINGS: Seven images of the left femur were obtained via portable C-arm radiography and submitted for interpretation. Images show open reduction and hardware fixation of the intertrochanteric fracture involving the proximal left femur. An intra medullary rod and hip screw  has been placed. IMPRESSION: 1. Status post ORIF of the proximal left femur. Electronically Signed   By: Kerby Moors M.D.   On: 07/07/2016 14:46   Dg Hip Operative Unilat With Pelvis Left  Result Date: 07/07/2016 CLINICAL DATA:  Status post ORIF of left hip. EXAM: DG C-ARM 61-120 MIN; OPERATIVE LEFT HIP WITH PELVIS COMPARISON:  None. FINDINGS: Seven images of the left femur were obtained via portable C-arm radiography and submitted for interpretation. Images show open reduction and hardware fixation of the intertrochanteric fracture involving the proximal left femur. An intra medullary rod and hip screw has been placed. IMPRESSION: 1. Status post ORIF of the proximal left femur. Electronically Signed   By: Kerby Moors M.D.   On: 07/07/2016  14:46     Medications:   . sodium chloride 75 mL/hr at 07/09/16 1020  . phenylephrine (NEO-SYNEPHRINE) Adult infusion Stopped (07/08/16 0700)   . ALPRAZolam  0.5 mg Oral TID  . calcitRIOL  0.25 mcg Oral Daily  . cefTRIAXone (ROCEPHIN)  IV  1 g Intravenous Q24H  . Chlorhexidine Gluconate Cloth  6 each Topical Q0600  . citalopram  40 mg Oral Daily  . docusate sodium  100 mg Oral BID  . enoxaparin (LOVENOX) injection  1 mg/kg Subcutaneous Q24H  . febuxostat  40 mg Oral QHS  . ipratropium-albuterol  3 mL Nebulization QID  . mupirocin ointment  1 application Nasal BID  . polyethylene glycol  17 g Oral Daily  . pravastatin  80 mg Oral q1800  . tamsulosin  0.4 mg Oral Daily  . [START ON 07/11/2016] warfarin  2.5 mg Oral Once per day on Tue Thu Sat  . warfarin  5 mg Oral Once per day on Sun Mon Wed Fri  . Warfarin - Pharmacist Dosing Inpatient   Does not apply q1800   acetaminophen **OR** acetaminophen, alum & mag hydroxide-simeth, bisacodyl, cyclobenzaprine, guaiFENesin-dextromethorphan, HYDROcodone-acetaminophen, HYDROmorphone (DILAUDID) injection, ipratropium-albuterol, magnesium citrate, magnesium hydroxide, menthol-cetylpyridinium **OR** phenol,  metoCLOPramide **OR** metoCLOPramide (REGLAN) injection, ondansetron **OR** ondansetron (ZOFRAN) IV  Assessment/ Plan:  75 y.o.caucasian male with hypertension, morbid obesity, gout, history of nonsteroidal use, chronic atrial fibrillation, hyperlipidemia,diabetes, depression, ICD, congestive heart failure with LVEF 25%, chronic kidney disease now presents with comminuted intertrochanteric fracture of the left femur  1.  Acute renal failure on chronic kidney disease stage IV Cause of ARF not entirely clear but likely secondary to volume depletion/UTI. S Creatinine and BUN remains critically high today but improving.  BP remains borderline low.   UOP has started to improve with IV hydration Continue IV fluids  But reduce rate. Encourage PO intake  Agree with holding diuretics Electrolytes and Volume status are acceptable No acute indication for Dialysis at present  Careful volume repletion as patient has LVEF < 25%  2. Hematuria, likely from UTI Urine culture growing multidrug resistant Morganella morganii Urine P/C ratio 0.5 Abx as per IM team  3.  Lower extremity edema Acceptable control at present .    LOS: 4 Lauriana Denes 10/15/201711:24 AM

## 2016-07-09 NOTE — Progress Notes (Signed)
Physical Therapy Treatment Patient Details Name: Micheal [name] MRN: [id number] DOB: 05-23-1941 Today's Date: 07/09/2016    History of Present Illness Micheal [name] is a 75yo white male who comes to [facility] after sustaining a fall at home with Left hip fracture. Pt was held for a few days until medically stable/cleared for surgery by cardiology, nephrology, adn underwent Left hip ORIF/IM nailing on 10/13. Baseline level of funcitonal includes household AMB without AD, min-monA for bathing, and total assist for IADL. Pt does not tolerate long AMB distances. PMH: afib, PPM, CHF, EF: 25%, CKD-4. on POD he transferd from CCU to 1A.     PT Comments    Pt tolerating treatment session well, motivated and able to complete entire PT sesssion as planned. Pt continues to make progress toward goals as evidenced by improved tolerance to transfers and seated exercises. Pt's greatest limitation continues to be global weakness and lethargy which continues to limit ability to perform all basic mobility at baseline function. SaO2 and Hr are stable throughout session and improved since last session. Patient presenting with impairment of strength, pain, range of motion, balance, and activity tolerance, limiting ability to perform ADL and mobility tasks at  baseline level of function. Patient will benefit from skilled intervention to address the above impairments and limitations, in order to restore to prior level of function, improve patient safety upon discharge, and to decrease caregiver burden.    Follow Up Recommendations  SNF     Equipment Recommendations       Recommendations for Other Services       Precautions / Restrictions Precautions Precautions: Fall Restrictions LLE Weight Bearing: Weight bearing as tolerated Other Position/Activity Restrictions: Remote Left UE fracture, non-surgical, with chronic functional hypofunciton.     Mobility  Bed Mobility Overal bed mobility: +2 for physical  assistance;+ 2 for safety/equipment;Needs Assistance Bed Mobility: Sit to Supine     Supine to sit: Total assist;+2 for physical assistance Sit to supine: +2 for physical assistance;+2 for safety/equipment;Total assist   General bed mobility comments: LUE with limited capacity to assist; unable to bridge  Transfers Overall transfer level: Needs assistance Equipment used: Rolling walker (2 wheeled) Transfers: Stand Pivot Transfers Sit to Stand: Max assist;+2 physical assistance;From elevated surface Stand pivot transfers: +2 physical assistance;Max assist       General transfer comment: improved stability in partial stance.   Ambulation/Gait                 Stairs            Wheelchair Mobility    Modified Rankin (Stroke Patients Only)       Balance Overall balance assessment: Needs assistance Sitting-balance support: Single extremity supported Sitting balance-Leahy Scale: Poor Sitting balance - Comments: sitting EOB x6 minutes Postural control: Posterior lean Standing balance support: During functional activity;Single extremity supported Standing balance-Leahy Scale: Zero                      Cognition Arousal/Alertness: Lethargic Behavior During Therapy: WFL for tasks assessed/performed;Flat affect Overall Cognitive Status: Impaired/Different from baseline                      Exercises General Exercises - Lower Extremity Ankle Circles/Pumps: AAROM;Both;15 reps;Supine Long Arc Quad: Strengthening;15 reps;Seated;AAROM;Both Heel Slides: AAROM;Both;15 reps;Supine Hip ABduction/ADduction: AAROM;Both;15 reps;Supine Hip Flexion/Marching: Seated;Strengthening;AAROM;Both;10 reps    General Comments        Pertinent Vitals/Pain Pain Assessment: No/denies pain Pain Intervention(s): Limited  activity within patient's tolerance;Monitored during session;Repositioned    Home Living                      Prior Function             PT Goals (current goals can now be found in the care plan section) Acute Rehab PT Goals Patient Stated Goal: To return home to PLOF. PT Goal Formulation: With patient Time For Goal Achievement: 07/22/16 Potential to Achieve Goals: Fair Progress towards PT goals: Progressing toward goals    Frequency    BID      PT Plan Current plan remains appropriate    Co-evaluation             End of Session Equipment Utilized During Treatment: Oxygen Activity Tolerance: Patient tolerated treatment well;Patient limited by fatigue;Patient limited by lethargy;Treatment limited secondary to medical complications (Comment) Patient left: with family/visitor present;in chair;with call bell/phone within reach;with nursing/sitter in room;with chair alarm set     Time: 1354-1420 PT Time Calculation (min) (ACUTE ONLY): 26 min  Charges:  $Therapeutic Exercise: 8-22 mins $Therapeutic Activity: 8-22 mins                    G Codes:      2:27 PM, July 25, 2016 Etta Grandchild, PT, DPT Physical Therapist - [place] [phone] (mobile)

## 2016-07-09 NOTE — Progress Notes (Signed)
[facility] NAME: Micheal [name]    MR#:  [id number]  DATE OF BIRTH:  27-May-1941  SUBJECTIVE:  CHIEF COMPLAINT:   Chief Complaint  Patient presents with  . Fall  . Leg Pain     Came with a fall and hip fracture.   Also have A fib on coumadin , and CKD.  have c/o congestion in chest.   INR is 1.5    had hypotension after surgery, now stable.   Was confused in night, calm and alert this morning.  REVIEW OF SYSTEMS:   Constitutional: Positive for malaise/fatigue. Negative for chills, fever and weight loss.  HENT: Negative for ear discharge, ear pain, hearing loss and nosebleeds.   Eyes: Negative for blurred vision, double vision and photophobia.  Respiratory: Negative for cough, hemoptysis, shortness of breath and wheezing.   Cardiovascular: Positive for leg swelling. Negative for chest pain, palpitations and orthopnea.  Gastrointestinal: Negative for abdominal pain, constipation, diarrhea, heartburn, melena, nausea and vomiting.  Genitourinary: Negative for dysuria and urgency.  Musculoskeletal: Positive for falls, joint pain and myalgias. Negative for back pain and neck pain.  Skin: Negative for rash.  Neurological: Negative for dizziness, tingling, sensory change, speech change, focal weakness and headaches.  Endo/Heme/Allergies: Does not bruise/bleed easily.  Psychiatric/Behavioral: Negative for depression.   ROS  DRUG ALLERGIES:  No Known Allergies  VITALS:  Blood pressure 115/63, pulse 71, temperature 98.1 F (36.7 C), temperature source Oral, resp. rate 17, height 5\' 10"  (1.778 m), weight 111.5 kg (245 lb 13 oz), SpO2 96 %.  PHYSICAL EXAMINATION:   GENERAL:  75 y.o.-year-old patient lying in the bed with no acute distress. Dissheleved appearing. EYES: Pupils equal, round, reactive to light and accommodation. No scleral icterus. Extraocular muscles intact.  HEENT: Head atraumatic, normocephalic. Oropharynx and  nasopharynx clear.  NECK:  Supple, no jugular venous distention. No thyroid enlargement, no tenderness.  LUNGS: Normal breath sounds bilaterally, no wheezing, rales,rhonchi or crepitation. No use of accessory muscles of respiration. Decreased bibasilar breath sounds. Some coarse coughing during my exam. CARDIOVASCULAR: S1, S2 normal. No  rubs, or gallops. 3/6 systolic murmur in place ABDOMEN: Soft, nontender, nondistended. Bowel sounds present. No organomegaly or mass.  EXTREMITIES: Left thigh have dressing. Intact neurovascular bundle. No cyanosis, or clubbing. 2+ lower extremity edema with chronic pigmentation changes NEUROLOGIC: Cranial nerves II through XII are intact. Muscle strength 4/5 in all extremities. Sensation intact. Gait not checked.  PSYCHIATRIC: The patient is alert and oriented x 3.  SKIN: No obvious rash, lesion, or ulcer.   Physical Exam LABORATORY PANEL:   CBC  Recent Labs Lab 07/08/16 0412  WBC 13.4*  HGB 8.7*  HCT 26.5*  PLT 126*   ------------------------------------------------------------------------------------------------------------------  Chemistries   Recent Labs Lab 07/05/16 0756  07/09/16 0420  NA 136  < > 136  K 3.9  < > 3.6  CL 94*  < > 99*  CO2 30  < > 29  GLUCOSE 165*  < > 144*  BUN 86*  < > 93*  CREATININE 3.62*  < > 3.86*  CALCIUM 9.9  < > 8.8*  AST 19  --   --   ALT 15*  --   --   ALKPHOS 58  --   --   BILITOT 0.7  --   --   < > = values in this interval not displayed. ------------------------------------------------------------------------------------------------------------------  Cardiac Enzymes No results for input(s): TROPONINI in the last 168  hours. ------------------------------------------------------------------------------------------------------------------  RADIOLOGY:  Ct Head Wo Contrast  Result Date: 07/07/2016 CLINICAL DATA:  Initial evaluation for pupil asymmetry following hip surgery today. EXAM: CT HEAD  WITHOUT CONTRAST TECHNIQUE: Contiguous axial images were obtained from the base of the skull through the vertex without intravenous contrast. COMPARISON:  Prior CT from 07/05/2016. FINDINGS: Brain: Generalized cerebral atrophy with moderate chronic microvascular ischemic changes again noted. Scattered remote lacunar infarcts noted within the bilateral basal ganglia, stable. No acute intracranial hemorrhage. No evidence for acute large vessel territory infarct. Gray-white matter deficient maintained. Deep gray nuclei maintained. No insular ribbon sign. 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 No associated edema. No other mass lesion. No midline shift or mass effect. No hydrocephalus. No extra-axial fluid collection. Vascular: No hyperdense vessel. Scattered vascular calcifications present within the carotid siphons. Skull: Scalp soft tissues within normal limits.  Calvarium intact. Sinuses/Orbits: Globes and oral soft tissues within normal limits. Paranasal sinuses are clear. Trace right mastoid effusion. Left mastoid air cells clear. IMPRESSION: 1. No acute intracranial process identified. 2. Stable atrophy with chronic microvascular ischemic disease. Electronically Signed   By: Jeannine Boga M.D.   On: 07/07/2016 22:28   Dg C-arm 61-120 Min  Result Date: 07/07/2016 CLINICAL DATA:  Status post ORIF of left hip. EXAM: DG C-ARM 61-120 MIN; OPERATIVE LEFT HIP WITH PELVIS COMPARISON:  None. FINDINGS: Seven images of the left femur were obtained via portable C-arm radiography and submitted for interpretation. Images show open reduction and hardware fixation of the intertrochanteric fracture involving the proximal left femur. An intra medullary rod and hip screw has been placed. IMPRESSION: 1. Status post ORIF of the proximal left femur. Electronically Signed   By: Kerby Moors M.D.   On: 07/07/2016 14:46   Dg Hip Operative Unilat With Pelvis Left  Result Date: 07/07/2016 CLINICAL DATA:   Status post ORIF of left hip. EXAM: DG C-ARM 61-120 MIN; OPERATIVE LEFT HIP WITH PELVIS COMPARISON:  None. FINDINGS: Seven images of the left femur were obtained via portable C-arm radiography and submitted for interpretation. Images show open reduction and hardware fixation of the intertrochanteric fracture involving the proximal left femur. An intra medullary rod and hip screw has been placed. IMPRESSION: 1. Status post ORIF of the proximal left femur. Electronically Signed   By: Kerby Moors M.D.   On: 07/07/2016 14:46    ASSESSMENT AND PLAN:   Active Problems:   Hip fracture (HCC)  #1 Left Hip fracture- secondary to fall. Orthopedics consulted. - Patient was announced moderate to high risk for surgery - Cardiology consult requested for cardiac clearance. INR is elevated, so held Coumadin. - One dose of oral vitamin K , given one dose [place] vit K .  - Explained risks to family and also discussed with orthopedic surgeon - Pain medication. 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 On Diuril takes at home. - Hold Lasix and metolazone. Nephrology consulted. - Gentle hydration today in follow-up. Renally adjust all medications. - Checked SPEP and UPEP and PTH as calcium also elevated. - appreciated nephrology help. worsening renal func. - no need for HD yet. - slightly better renal func.  #3 Ac on Chronic systolic CHF- well compensated, EF is 25%   Chronic lower extremity edema. Held Lasix and metolazone   No lasix for now as renal func worsening, and  question of how much he will response.   I discussed with Dr. Candiss Norse, likely secondary to very Low EF, low volume.   Appears stable so far.  On Gentle IV hydration to help renal func.   IV fluids stopped, stable now.  #4 Afib- rate controlled, on coreg-  - hold warfarin, s/p pacemaker  Hold COREG due to  borderline BP.  Now on therapeutic lovenox.  re-started coumadin.  #5 Gout-continue home medications  #6 BPH- on flomax  #7 DVT Prophylaxis- now INR at 2, start after the surgery  #8 meningioma   incindental finding, discussed with his daughter in room.  # 9 hematuria   and UTI   Ur cx- morgenella, sensitive to Rocephin.   Monitor CBC. Hb stable.  #10 had ome concern about pupil size after sx, CT head normal.   No findings now.  All the records are reviewed and case discussed with Care Management/Social Workerr. Management plans discussed with the patient, family and they are in agreement.  CODE STATUS: Full  TOTAL TIME TAKING CARE OF THIS PATIENT: 35 minutes.   POSSIBLE D/C IN 1-2 DAYS, DEPENDING ON CLINICAL CONDITION.   Vaughan Basta M.D on 07/09/2016   Between 7am to 6pm - Pager - [phone]  After 6pm go to [email or web address] - password [email or web address] [facility]  Office  [phone]  CC: Primary care physician; [name], MD  Note: This dictation was prepared with Dragon dictation along with smaller phrase technology. Any transcriptional errors that result from this process are unintentional.

## 2016-07-09 NOTE — Consult Note (Signed)
ANTICOAGULATION CONSULT NOTE - Initial Consult  Pharmacy Consult for warfarin Indication: atrial fibrillation  No Known Allergies  Patient Measurements: Height: 5\' 10"  (177.8 cm) Weight: 245 lb 13 oz (111.5 kg) IBW/kg (Calculated) : 73 Heparin Dosing Weight:   Vital Signs: Temp: 98.6 F (37 C) (10/15 0719) Temp Source: Oral (10/15 0719) BP: 106/56 (10/15 0719) Pulse Rate: 75 (10/15 0719)  Labs:  Recent Labs  07/06/16 2042 07/07/16 0427 07/08/16 0412 07/08/16 0835 07/09/16 0420  HGB 9.8* 9.8* 8.7*  --   --   HCT 29.6* 29.0* 26.5*  --   --   PLT 124* 128* 126*  --   --   LABPROT  --  21.0*  --  18.3* 17.7*  INR  --  1.79  --  1.50 1.44  CREATININE  --  4.02* 4.08*  --  3.86*    Estimated Creatinine Clearance: 21.3 mL/min (by C-G formula based on SCr of 3.86 mg/dL (H)).   Medical History: Past Medical History:  Diagnosis Date  . Anxiety   . Atrial fibrillation ([place])   . 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 CAD (coronary artery disease)   . CHF (congestive heart failure) ([place])   . Chronic back pain   . Chronic systolic heart failure (HCC) ~2009   EF under 25%  . CKD (chronic kidney disease) stage 3, GFR 30-59 ml/min 2009   stage III  . Dyspnea    with exertion  . Fracture closed, humerus    never hadsurgery, left  . Gout, unspecified   . Hypertension   . Myocardial infarction    pt is unaware of this but doctors say he has  . Other left bundle branch block   . Thrombocytopenia (HCC)     Medications:  Scheduled:  . ALPRAZolam  0.5 mg Oral TID  . calcitRIOL  0.25 mcg Oral Daily  . cefTRIAXone (ROCEPHIN)  IV  1 g Intravenous Q24H  . Chlorhexidine Gluconate Cloth  6 each Topical Q0600  . citalopram  40 mg Oral Daily  . docusate sodium  100 mg Oral BID  . enoxaparin (LOVENOX) injection  1 mg/kg Subcutaneous Q24H  . febuxostat  40 mg Oral QHS  . ipratropium-albuterol  3 mL Nebulization QID  . mupirocin ointment  1  application Nasal BID  . polyethylene glycol  17 g Oral Daily  . pravastatin  80 mg Oral q1800  . tamsulosin  0.4 mg Oral Daily  . [START ON 07/11/2016] warfarin  2.5 mg Oral Once per day on Tue Thu Sat  . warfarin  5 mg Oral Once per day on Sun Mon Wed Fri  . Warfarin - Pharmacist Dosing Inpatient   Does not apply q1800    Assessment: Pt is a 75 year old male w/ a PMH of Afib on warfarin at home. Pt is s/p left hip surgery. INR is subtherapeutic at 1.5. Pt received a total of 10mg  vit K (5mg  po, 5mg  Iv). Pharmacy consulted to restart warfarin. Patient on Lovenox bridge.  Pt home dose is 5mg  Sun,MWF and 2.5 T,T,sat. Pt last warfarin dose was 10/10  10/14  INR  1.5    Coumadin 5 mg 10/15  INR  1.44  Goal of Therapy:  INR 2-3  Monitor platelets by anticoagulation protocol: Yes   Plan:  Continue PTA dosing of Coumadin and check INR in the AM.  [name] D 07/09/2016,7:31 AM

## 2016-07-09 NOTE — Progress Notes (Signed)
Physical Therapy Treatment Patient Details Name: Micheal [name] MRN: [id number] DOB: 11-26-1940 Today's Date: 07/09/2016    History of Present Illness Micheal [name] is a 75yo white male who comes to [facility] after sustaining a fall at home with Left hip fracture. Pt was held for a few days until medically stable/cleared for surgery by cardiology, nephrology, adn underwent Left hip ORIF/IM nailing on 10/13. Baseline level of funcitonal includes household AMB without AD, min-monA for bathing, and total assist for IADL. Pt does not tolerate long AMB distances. PMH: afib, PPM, CHF, EF: 25%, CKD-4. on POD he transferd from CCU to 1A.     PT Comments    Pt making progress toward goals, demonstrating improved tolerance to activity, now able to tolerate transfers and bed mobility, albeit remaining very weak and requiring max-total assist +2. Pt seems more lethargic today, requiring several minutes of active stimulation to fully awaken to participate. Pain remains well controlled in the operative leg, however, hip+knee flexion remains limited with guarding and restriction without clear explanation. Pt is more motivated this session than previous. Bed level exercises are about the same in effort and quality. Vitals are more stable today with activity than previous, no patent desaturation or tachycardia noted.     Follow Up Recommendations  SNF     Equipment Recommendations       Recommendations for Other Services       Precautions / Restrictions Precautions Precautions: Fall Restrictions Weight Bearing Restrictions: Yes LLE Weight Bearing: Weight bearing as tolerated Other Position/Activity Restrictions: Remote Left UE fracture, non-surgical, with chronic functional hypofunciton.     Mobility  Bed Mobility Overal bed mobility: +2 for physical assistance;+ 2 for safety/equipment;Needs Assistance Bed Mobility: Supine to Sit     Supine to sit: Total assist;+2 for physical assistance      General bed mobility comments: LUE with limited capacity to assist.   Transfers Overall transfer level: Needs assistance Equipment used: Rolling walker (2 wheeled) Transfers: Sit to/from [employer] Transfers Sit to Stand: Max assist;+2 physical assistance;From elevated surface Stand pivot transfers: From elevated surface;Total assist;+2 physical assistance          Ambulation/Gait                 Stairs            Wheelchair Mobility    Modified Rankin (Stroke Patients Only)       Balance Overall balance assessment: Needs assistance Sitting-balance support: Single extremity supported Sitting balance-Leahy Scale: Poor Sitting balance - Comments: sitting EOB x6 minutes Postural control: Posterior lean Standing balance support: During functional activity;Single extremity supported Standing balance-Leahy Scale: Zero                      Cognition Arousal/Alertness: Lethargic Behavior During Therapy: WFL for tasks assessed/performed;Flat affect Overall Cognitive Status: Impaired/Different from baseline (lethargic, difficulty with stream of thought/speech)                      Exercises General Exercises - Lower Extremity Ankle Circles/Pumps: AAROM;Both;15 reps;Supine Long Arc Quad: Strengthening;Right;15 reps;Seated Heel Slides: AAROM;Both;15 reps;Supine Hip ABduction/ADduction: AAROM;Both;15 reps;Supine    General Comments        Pertinent Vitals/Pain Pain Assessment: No/denies pain Pain Intervention(s): Limited activity within patient's tolerance;Monitored during session;Repositioned    Home Living                      Prior Function  PT Goals (current goals can now be found in the care plan section) Acute Rehab PT Goals Patient Stated Goal: To return home to PLOF. PT Goal Formulation: With patient Time For Goal Achievement: 07/22/16 Potential to Achieve Goals: Fair Progress towards PT goals:  Progressing toward goals    Frequency    BID      PT Plan Current plan remains appropriate    Co-evaluation             End of Session Equipment Utilized During Treatment: Oxygen Activity Tolerance: Patient tolerated treatment well;Patient limited by fatigue;Patient limited by lethargy;Treatment limited secondary to medical complications (Comment) Patient left: with family/visitor present;in chair;with call bell/phone within reach;with nursing/sitter in room;with chair alarm set     Time: [id number] PT Time Calculation (min) (ACUTE ONLY): 43 min  Charges:                       G Codes:      12:38 PM, 08/05/16 Etta Grandchild, PT, DPT Physical Therapist - [place] [phone] (mobile)

## 2016-07-09 NOTE — Care Management Important Message (Signed)
Important Message  Patient Details  Name: Micheal [name] MRN: [id number] Date of Birth: 01/30/1941   Medicare Important Message Given:  Yes    Saige Canton A, RN 07/09/2016, 2:19 PM

## 2016-07-10 ENCOUNTER — Encounter: Payer: Self-pay | Admitting: Orthopedic Surgery

## 2016-07-10 LAB — BASIC METABOLIC PANEL
ANION GAP: 7 (ref 5–15)
BUN: 99 mg/dL — ABNORMAL HIGH (ref 6–20)
CALCIUM: 8.8 mg/dL — AB (ref 8.9–10.3)
CO2: 29 mmol/L (ref 22–32)
CREATININE: 3.63 mg/dL — AB (ref 0.61–1.24)
Chloride: 100 mmol/L — ABNORMAL LOW (ref 101–111)
GFR, EST AFRICAN AMERICAN: 18 mL/min — AB (ref >60)
GFR, EST NON AFRICAN AMERICAN: 15 mL/min — AB (ref >60)
Glucose, Bld: 116 mg/dL — ABNORMAL HIGH (ref 65–99)
Potassium: 3.7 mmol/L (ref 3.5–5.1)
SODIUM: 136 mmol/L (ref 135–145)

## 2016-07-10 LAB — PROTIME-INR
INR: 1.4
PROTHROMBIN TIME: 17.3 s — AB (ref 11.4–15.2)

## 2016-07-10 LAB — CBC
HCT: 25.7 % — ABNORMAL LOW (ref 40.0–52.0)
HEMOGLOBIN: 8.6 g/dL — AB (ref 13.0–18.0)
MCH: 30.2 pg (ref 26.0–34.0)
MCHC: 33.4 g/dL (ref 32.0–36.0)
MCV: 90.5 fL (ref 80.0–100.0)
PLATELETS: 124 10*3/uL — AB (ref 150–440)
RBC: 2.84 MIL/uL — AB (ref 4.40–5.90)
RDW: 14.3 % (ref 11.5–14.5)
WBC: 7 10*3/uL (ref 3.8–10.6)

## 2016-07-10 LAB — GLUCOSE, CAPILLARY: GLUCOSE-CAPILLARY: 128 mg/dL — AB (ref 65–99)

## 2016-07-10 MED ORDER — BISACODYL 10 MG RE SUPP
10.0000 mg | Freq: Every day | RECTAL | Status: DC
  Administered 2016-07-10: 10 mg via RECTAL
  Filled 2016-07-10 (×2): qty 1

## 2016-07-10 MED ORDER — CALCIUM CARBONATE-VITAMIN D 500-200 MG-UNIT PO TABS
1.0000 | ORAL_TABLET | Freq: Two times a day (BID) | ORAL | Status: DC
  Administered 2016-07-10 – 2016-07-17 (×14): 1 via ORAL
  Filled 2016-07-10 (×14): qty 1

## 2016-07-10 MED ORDER — FERROUS SULFATE 325 (65 FE) MG PO TABS
325.0000 mg | ORAL_TABLET | Freq: Two times a day (BID) | ORAL | Status: DC
  Administered 2016-07-10 – 2016-07-17 (×13): 325 mg via ORAL
  Filled 2016-07-10 (×13): qty 1

## 2016-07-10 NOTE — Anesthesia Postprocedure Evaluation (Signed)
Anesthesia Post Note  Patient: Micheal [name]  Procedure(s) Performed: Procedure(s) (LRB): INTRAMEDULLARY (IM) NAIL INTERTROCHANTRIC (Left)  Patient location during evaluation: PACU Anesthesia Type: General Level of consciousness: awake and alert Pain management: pain level controlled Vital Signs Assessment: post-procedure vital signs reviewed and stable Respiratory status: spontaneous breathing, nonlabored ventilation, respiratory function stable and patient connected to nasal cannula oxygen Cardiovascular status: blood pressure returned to baseline and stable Postop Assessment: no signs of nausea or vomiting Anesthetic complications: no    Last Vitals:  Vitals:   07/10/16 1213 07/10/16 1522  BP: 119/61 (!) 110/59  Pulse: 75 75  Resp: 18 18  Temp: 36.7 C 37.1 C    Last Pain:  Vitals:   07/10/16 1522  TempSrc: Oral  PainSc:                  Molli Barrows

## 2016-07-10 NOTE — Consult Note (Signed)
ANTICOAGULATION CONSULT NOTE - Initial Consult  Pharmacy Consult for warfarin Indication: atrial fibrillation  No Known Allergies  Patient Measurements: Height: 5\' 10"  (177.8 cm) Weight: 245 lb 13 oz (111.5 kg) IBW/kg (Calculated) : 73 Heparin Dosing Weight:   Vital Signs: Temp: 96.9 F (36.1 C) (10/16 0719) Temp Source: Axillary (10/16 0719) BP: 104/60 (10/16 0719) Pulse Rate: 75 (10/16 0937)  Labs:  Recent Labs  07/08/16 0412 07/08/16 0835 07/09/16 0420 07/10/16 0349  HGB 8.7*  --   --  8.6*  HCT 26.5*  --   --  25.7*  PLT 126*  --   --  124*  LABPROT  --  18.3* 17.7* 17.3*  INR  --  1.50 1.44 1.40  CREATININE 4.08*  --  3.86* 3.63*    Estimated Creatinine Clearance: 22.7 mL/min (by C-G formula based on SCr of 3.63 mg/dL (H)).   Medical History: Past Medical History:  Diagnosis Date  . Anxiety   . Atrial fibrillation ([place])   . 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 CAD (coronary artery disease)   . CHF (congestive heart failure) ([place])   . Chronic back pain   . Chronic systolic heart failure (HCC) ~2009   EF under 25%  . CKD (chronic kidney disease) stage 3, GFR 30-59 ml/min 2009   stage III  . Dyspnea    with exertion  . Fracture closed, humerus    never hadsurgery, left  . Gout, unspecified   . Hypertension   . Myocardial infarction    pt is unaware of this but doctors say he has  . Other left bundle branch block   . Thrombocytopenia (HCC)     Medications:  Scheduled:  . ALPRAZolam  0.5 mg Oral TID  . calcitRIOL  0.25 mcg Oral Daily  . calcium-vitamin D  1 tablet Oral BID  . cefTRIAXone (ROCEPHIN)  IV  1 g Intravenous Q24H  . Chlorhexidine Gluconate Cloth  6 each Topical Q0600  . citalopram  40 mg Oral Daily  . docusate sodium  100 mg Oral BID  . enoxaparin (LOVENOX) injection  1 mg/kg Subcutaneous Q24H  . febuxostat  40 mg Oral QHS  . ferrous sulfate  325 mg Oral BID WC  . ipratropium-albuterol  3 mL  Nebulization QID  . mupirocin ointment  1 application Nasal BID  . polyethylene glycol  17 g Oral Daily  . pravastatin  80 mg Oral q1800  . tamsulosin  0.4 mg Oral Daily  . [START ON 07/11/2016] warfarin  2.5 mg Oral Once per day on Tue Thu Sat  . warfarin  5 mg Oral Once per day on Sun Mon Wed Fri  . Warfarin - Pharmacist Dosing Inpatient   Does not apply q1800    Assessment: Pt is a 75 year old male w/ a PMH of Afib on warfarin at home. Pt is s/p left hip surgery. INR is subtherapeutic at 1.5. Pt received a total of 10mg  vit K (5mg  po, 5mg  Iv). Pharmacy consulted to restart warfarin. Patient on Lovenox bridge.  Pt home dose is 5mg  Sun,MWF and 2.5 T,T,sat. Pt last warfarin dose was 10/10  Date  INR  Warfarin Dose 10/11  2.01  0, vit K 5mg  PO 10/12  2.01  0, vit K 5mg  IV 10/13  1.79  none 10/14  1.50   5 mg 10/15  1.44  5mg  10/16  1.40  Goal of Therapy:  INR 2-3  Monitor platelets  by anticoagulation protocol: Yes   Plan:  INR subtherapeutic, continue home regimen. To receive warfarin 5mg  tonight. Continue enoxaparin bridge with enoxaparin 110mg  SQ Q24H. Recheck INR with AM labs  Josefina Rynders C 07/10/2016,11:03 AM

## 2016-07-10 NOTE — Progress Notes (Signed)
Physical Therapy Treatment Patient Details Name: Micheal [name] MRN: [id number] DOB: 09/14/1941 Today's Date: 07/10/2016    History of Present Illness Micheal [name] is a 75yo white male who comes to [facility] ([place]) after sustaining a fall at home with Left hip fracture. Pt was held for a few days until medically stable/cleared for surgery by cardiology, nephrology, adn underwent Left hip ORIF/IM nailing on 10/13. Baseline level of funcitonal includes household AMB without AD, min-monA for bathing, and total assist for IADL. Pt does not tolerate long AMB distances. PMH: afib, PPM, CHF, EF: 25%, CKD-4. on POD he transferd from CCU to 1A.     PT Comments    Pt sleeping, but easily awoken; daughter in the room. Pt notes soreness in left hip/thigh. Pt remains somewhat lethargic/flat affect, but agreeable to PT. Participates in supine bed exercises with increased time required and assist throughout. Requires Max of 2 for supine to sit; static sit challenging, but attainable/sustainable with intermittent verbal and tactile cueing for weight shift as needed. Pt does demonstrate ability to sit without upper extremity support and tolerate minimal directional challenges with cues given for direction to give resistance. Pt tolerates seated position primarily with bilateral upper extremity support for 9 minutes before complete fatigue. Total dependence to return to supine and readjust upright in bed. Encouraged continued work on exercises throughout the day with written program provided. Continue PT to progress strength, endurance and balance to improve all functional mobility.   Follow Up Recommendations  SNF     Equipment Recommendations       Recommendations for Other Services       Precautions / Restrictions Precautions Precautions: Fall Restrictions Weight Bearing Restrictions: Yes LLE Weight Bearing: Weight bearing as tolerated    Mobility  Bed Mobility Overal bed mobility: Needs Assistance;+2 for  physical assistance Bed Mobility: Sit to Supine;Supine to Sit     Supine to sit: Max assist;+2 for physical assistance Sit to supine: Total assist;+2 for physical assistance   General bed mobility comments: Pt initiates with cues to move LEs to edge of bed; unable to shift hips to edge. Does assit minimally with RUE to push of head of bed. Dependent to return to supine  Transfers                    Ambulation/Gait                 Stairs            Wheelchair Mobility    Modified Rankin (Stroke Patients Only)       Balance Overall balance assessment: Needs assistance Sitting-balance support: No upper extremity supported;Feet supported;Bilateral upper extremity supported Sitting balance-Leahy Scale: Fair                              Cognition Arousal/Alertness: Lethargic Behavior During Therapy: WFL for tasks assessed/performed;Flat affect Overall Cognitive Status: Impaired/Different from baseline                      Exercises General Exercises - Lower Extremity Ankle Circles/Pumps: AROM;Both;20 reps;Supine Quad Sets: Strengthening;Both;10 reps;Supine Gluteal Sets: Strengthening;Both;10 reps;Supine Short Arc Quad: AAROM;Left;10 reps;Supine (AROM on R) Heel Slides: AAROM;Both;10 reps;Supine Hip ABduction/ADduction: AAROM;Both;10 reps;Supine Hip Flexion/Marching: AAROM;Left;10 reps;Seated Other Exercises Other Exercises: B hip add'r squeeze 10x supine Other Exercises: B ham set 10x each supine Other Exercises: Sitting balance; no UE support with minimal challenge and instruction  on direction to resist; 5x each direction    General Comments        Pertinent Vitals/Pain Pain Assessment: Faces Faces Pain Scale: Hurts little more Pain Location: L hip/thigh Pain Descriptors / Indicators: Operative site guarding;Sore Pain Intervention(s): Monitored during session;Limited activity within patient's tolerance    Home Living                       Prior Function            PT Goals (current goals can now be found in the care plan section) Progress towards PT goals: Progressing toward goals (slowly)    Frequency    BID      PT Plan Current plan remains appropriate    Co-evaluation             End of Session   Activity Tolerance: Patient tolerated treatment well;Patient limited by fatigue;Other (comment) (profound weakness)       Time: [id number] PT Time Calculation (min) (ACUTE ONLY): 50 min  Charges:  $Therapeutic Exercise: 8-22 mins $Therapeutic Activity: 8-22 mins $Neuromuscular Re-education: 8-22 mins                    G Codes:      Larae Grooms, PTA 07/10/2016, 11:05 AM

## 2016-07-10 NOTE — Progress Notes (Signed)
OT Cancellation Note  Patient Details Name: Micheal [name] MRN: [id number] DOB: [birth year]   Cancelled Treatment:    Reason Eval/Treat Not Completed: Fatigue/lethargy limiting ability to participate Pt declined therapy stating he was fatigued and was only able to participate in bed exercises with PT this afternoon.  Will continue with OT tomorrow as tolerated.   Chrys Racer, OTR/L ascom [phone] 07/10/16, 3:54 PM

## 2016-07-10 NOTE — Progress Notes (Signed)
[facility] [place] NAME: Micheal [name]    MR#:  [id number]  DATE OF BIRTH:  [birth year]  SUBJECTIVE:  CHIEF COMPLAINT:   Chief Complaint  Patient presents with  . Fall  . Leg Pain     Came with a fall and hip fracture.   Also have A fib on coumadin , and CKD.    had hypotension after surgery, now stable.    calm and alert this morning. Tolerating diet and working with PT.  REVIEW OF SYSTEMS:   Constitutional: Positive for malaise/fatigue. Negative for chills, fever and weight loss.  HENT: Negative for ear discharge, ear pain, hearing loss and nosebleeds.   Eyes: Negative for blurred vision, double vision and photophobia.  Respiratory: Negative for cough, hemoptysis, shortness of breath and wheezing.   Cardiovascular: Positive for leg swelling. Negative for chest pain, palpitations and orthopnea.  Gastrointestinal: Negative for abdominal pain, constipation, diarrhea, heartburn, melena, nausea and vomiting.  Genitourinary: Negative for dysuria and urgency.  Musculoskeletal: Positive for falls, joint pain and myalgias. Negative for back pain and neck pain.  Skin: Negative for rash.  Neurological: Negative for dizziness, tingling, sensory change, speech change, focal weakness and headaches.  Endo/Heme/Allergies: Does not bruise/bleed easily.  Psychiatric/Behavioral: Negative for depression.   ROS  DRUG ALLERGIES:  No Known Allergies  VITALS:  Blood pressure 119/61, pulse 75, temperature 98.1 F (36.7 C), resp. rate 18, height 5\' 10"  (1.778 m), weight 111.5 kg (245 lb 13 oz), SpO2 95 %.  PHYSICAL EXAMINATION:   GENERAL:  75 y.o.-year-old patient lying in the bed with no acute distress. Dissheleved appearing. EYES: Pupils equal, round, reactive to light and accommodation. No scleral icterus. Extraocular muscles intact.  HEENT: Head atraumatic, normocephalic. Oropharynx and nasopharynx clear.  NECK:  Supple, no jugular venous  distention. No thyroid enlargement, no tenderness.  LUNGS: Normal breath sounds bilaterally, no wheezing, rales,rhonchi or crepitation. No use of accessory muscles of respiration. Decreased bibasilar breath sounds. Some coarse coughing during my exam. CARDIOVASCULAR: S1, S2 normal. No  rubs, or gallops. 3/6 systolic murmur in place ABDOMEN: Soft, nontender, nondistended. Bowel sounds present. No organomegaly or mass.  EXTREMITIES: Left thigh have dressing. Intact neurovascular bundle. No cyanosis, or clubbing. 2+ lower extremity edema with chronic pigmentation changes NEUROLOGIC: Cranial nerves II through XII are intact. Muscle strength 4/5 in all extremities. Sensation intact. Gait not checked.  PSYCHIATRIC: The patient is alert and oriented x 3.  SKIN: No obvious rash, lesion, or ulcer.   Physical Exam LABORATORY PANEL:   CBC  Recent Labs Lab 07/10/16 0349  WBC 7.0  HGB 8.6*  HCT 25.7*  PLT 124*   ------------------------------------------------------------------------------------------------------------------  Chemistries   Recent Labs Lab 07/05/16 0756  07/10/16 0349  NA 136  < > 136  K 3.9  < > 3.7  CL 94*  < > 100*  CO2 30  < > 29  GLUCOSE 165*  < > 116*  BUN 86*  < > 99*  CREATININE 3.62*  < > 3.63*  CALCIUM 9.9  < > 8.8*  AST 19  --   --   ALT 15*  --   --   ALKPHOS 58  --   --   BILITOT 0.7  --   --   < > = values in this interval not displayed. ------------------------------------------------------------------------------------------------------------------  Cardiac Enzymes No results for input(s): TROPONINI in the last 168 hours. ------------------------------------------------------------------------------------------------------------------  RADIOLOGY:  No results found.  ASSESSMENT AND  PLAN:   Active Problems:   Hip fracture ([place])  #1 Left Hip fracture- secondary to fall. Orthopedics consulted. - Patient was announced moderate to high risk for  surgery - Cardiology consult requested for cardiac clearance. INR is elevated, so held Coumadin. - One dose of oral vitamin K , given one dose [place] vit K .  - Explained risks to family and also discussed with orthopedic surgeon - Pain medication. 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 On Diuril takes at home. - Hold Lasix and metolazone. Nephrology consulted. - Gentle hydration today in follow-up. Renally adjust all medications. - Checked SPEP and UPEP and PTH as calcium also elevated. - appreciated nephrology help. worsening renal func. - no need for HD yet. - slightly better renal func, still much higher BUN than baseline.  Nephrology suggests to monitor in hospital one more night.  #3 Ac on Chronic systolic CHF- well compensated, EF is 25%   Chronic lower extremity edema. Held Lasix and metolazone   No lasix for now as renal func worsening, and question of how much he will response.   I discussed with Dr. Candiss Norse, likely secondary to very Low EF, low volume.   Appears stable so far.   On Gentle IV hydration to help renal func.   IV fluids stopped, stable now.   BP is stable now to have good renal perfusion.  #4 Afib- rate controlled, on coreg-  - hold warfarin, s/p pacemaker  Hold COREG due to borderline BP.  Now on therapeutic lovenox.  re-started coumadin.  #5 Gout-continue home medications  #6 BPH- on flomax  #7 DVT Prophylaxis- now INR at 2, start after the surgery  #8 meningioma   incindental finding, discussed with his daughter in room.  # 9 hematuria   and UTI   Ur cx- morgenella, sensitive to Rocephin ( started 07/06/16)   Monitor CBC. Hb stable.  #10 had ome concern about pupil size after sx, CT head normal.   No findings now.  All the records are reviewed and case discussed with Care Management/Social Workerr. Management  plans discussed with the patient, family and they are in agreement.  CODE STATUS: Full  TOTAL TIME TAKING CARE OF THIS PATIENT: 35 minutes.   POSSIBLE D/C IN 1-2 DAYS, DEPENDING ON CLINICAL CONDITION. Discussed with daughter in room and nephrologist.  Vaughan Basta M.D on 07/10/2016   Between 7am to 6pm - Pager - [phone]  After 6pm go to [email or web address] - password [email or web address] [place] [facility]  Office  [phone]  CC: Primary care physician; [name], MD  Note: This dictation was prepared with Dragon dictation along with smaller phrase technology. Any transcriptional errors that result from this process are unintentional.

## 2016-07-10 NOTE — Progress Notes (Signed)
Patient's daughter stated that he had sat in chair and wanted pillow support removed from turning.

## 2016-07-10 NOTE — Progress Notes (Signed)
   Subjective: 3 Days Post-Op Procedure(s) (LRB): INTRAMEDULLARY (IM) NAIL INTERTROCHANTRIC (Left) Patient reports pain as mild.   Patient is well, and has had no acute complaints or problems Denies any CP, SOB, ABD pain. We will continue therapy today.  Plan is to go Skilled nursing facility after hospital stay.  Objective: Vital signs in last 24 hours: Temp:  [96.9 F (36.1 C)-98.1 F (36.7 C)] 96.9 F (36.1 C) (10/16 0719) Pulse Rate:  [71-78] 75 (10/16 0719) Resp:  [17-20] 18 (10/16 0719) BP: (104-127)/(59-66) 104/60 (10/16 0719) SpO2:  [91 %-100 %] 98 % (10/16 0746)  Intake/Output from previous day: 10/15 0701 - 10/16 0700 In: 2190.8 [P.O.:240; I.V.:1900.8; IV Piggyback:50] Out: [id number] [Urine:1050] Intake/Output this shift: No intake/output data recorded.   Recent Labs  07/08/16 0412 07/10/16 0349  HGB 8.7* 8.6*    Recent Labs  07/08/16 0412 07/10/16 0349  WBC 13.4* 7.0  RBC 2.93* 2.84*  HCT 26.5* 25.7*  PLT 126* 124*    Recent Labs  07/09/16 0420 07/10/16 0349  NA 136 136  K 3.6 3.7  CL 99* 100*  CO2 29 29  BUN 93* 99*  CREATININE 3.86* 3.63*  GLUCOSE 144* 116*  CALCIUM 8.8* 8.8*    Recent Labs  07/09/16 0420 07/10/16 0349  INR 1.44 1.40    EXAM General - Patient is Alert, Appropriate and Oriented Extremity - Neurovascular intact Sensation intact distally Intact pulses distally Dorsiflexion/Plantar flexion intact No cellulitis present Compartment soft no edema Dressing - dressing C/D/I and no drainage Motor Function - intact, moving foot and toes well on exam.   Past Medical History:  Diagnosis Date  . Anxiety   . Atrial fibrillation ([place])   . 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 CAD (coronary artery disease)   . CHF (congestive heart failure) ([place])   . Chronic back pain   . Chronic systolic heart failure (HCC) ~2009   EF under 25%  . CKD (chronic kidney disease) stage 3, GFR 30-59 ml/min 2009    stage III  . Dyspnea    with exertion  . Fracture closed, humerus    never hadsurgery, left  . Gout, unspecified   . Hypertension   . Myocardial infarction    pt is unaware of this but doctors say he has  . Other left bundle branch block   . Thrombocytopenia (HCC)     Assessment/Plan:   3 Days Post-Op Procedure(s) (LRB): INTRAMEDULLARY (IM) NAIL INTERTROCHANTRIC (Left) Active Problems:   Hip fracture (H[facility])  Estimated body mass index is 35.27 kg/m as calculated from the following:   Height as of this encounter: 5\' 10"  (1.778 m).   Weight as of this encounter: 111.5 kg (245 lb 13 oz). Advance diet Up with therapy  Needs BM Acute post op blood loss anemia - Hgb stable 8.6 CM to assist with discharge to SNF Discharge to SNF pending BM and medical clearance Remove staples and apply steri strips on 07/21/16 Follow up with [facility] in 6 weeks   DVT Prophylaxis - Lovenox, Foot Pumps and TED hose Weight-Bearing as tolerated to left leg   T. Rachelle Hora, PA-C [place] 07/10/2016, 8:14 AM

## 2016-07-10 NOTE — Progress Notes (Signed)
Physical Therapy Treatment Patient Details Name: Micheal [name] MRN: [id number] DOB: 09/28/1940 Today's Date: 07/10/2016    History of Present Illness Micheal [name] is a 75yo white male who comes to [facility] after sustaining a fall at home with Left hip fracture. Pt was held for a few days until medically stable/cleared for surgery by cardiology, nephrology, adn underwent Left hip ORIF/IM nailing on 10/13. Baseline level of funcitonal includes household AMB without AD, min-monA for bathing, and total assist for IADL. Pt does not tolerate long AMB distances. PMH: afib, PPM, CHF, EF: 25%, CKD-4. on POD he transferd from CCU to 1A.     PT Comments    Pt resistant to PT this afternoon, but with strong encouragement, agreeable to bed exercises. Pt lethargic. Performs exercises requiring assist throughout except for ankle pumps and short arc quad on right. Weak isometric contractions with exercises. Continue PT to improve strength and endurance and overall functional mobility.   Follow Up Recommendations  SNF     Equipment Recommendations       Recommendations for Other Services       Precautions / Restrictions Precautions Precautions: Fall Restrictions Weight Bearing Restrictions: Yes LLE Weight Bearing: Weight bearing as tolerated    Mobility  Bed Mobility Overal bed mobility: Needs Assistance;+2 for physical assistance Bed Mobility: Sit to Supine;Supine to Sit     Supine to sit: Max assist;+2 for physical assistance Sit to supine: Total assist;+2 for physical assistance   General bed mobility comments: Pt initiates with cues to move LEs to edge of bed; unable to shift hips to edge. Does assit minimally with RUE to push of head of bed. Dependent to return to supine  Transfers                    Ambulation/Gait                 Stairs            Wheelchair Mobility    Modified Rankin (Stroke Patients Only)       Balance Overall balance assessment:  Needs assistance Sitting-balance support: No upper extremity supported;Feet supported;Bilateral upper extremity supported Sitting balance-Leahy Scale: Fair                              Cognition Arousal/Alertness: Lethargic Behavior During Therapy: WFL for tasks assessed/performed;Flat affect Overall Cognitive Status: Impaired/Different from baseline                      Exercises General Exercises - Lower Extremity Ankle Circles/Pumps: AROM;Both;20 reps;Supine Quad Sets: Strengthening;Both;10 reps;Supine Gluteal Sets: Strengthening;Both;10 reps;Supine Short Arc Quad: AAROM;Left;10 reps;Supine (AROM on R) Heel Slides: AAROM;Both;10 reps;Supine Hip ABduction/ADduction: AAROM;Both;10 reps;Supine Hip Flexion/Marching: AAROM;Left;10 reps;Seated Other Exercises Other Exercises: B hip add'r squeeze 10x supine Other Exercises: B ham set 10x each supine Other Exercises: Sitting balance; no UE support with minimal challenge and instruction on direction to resist; 5x each direction    General Comments        Pertinent Vitals/Pain Pain Assessment: No/denies pain Faces Pain Scale: Hurts little more Pain Location: L hip/thigh Pain Descriptors / Indicators: Operative site guarding;Sore Pain Intervention(s): Monitored during session;Limited activity within patient's tolerance    Home Living                      Prior Function  PT Goals (current goals can now be found in the care plan section) Progress towards PT goals: Progressing toward goals (slowly)    Frequency    BID      PT Plan Current plan remains appropriate    Co-evaluation             End of Session   Activity Tolerance: Patient tolerated treatment well;Patient limited by fatigue;Other (comment) (profound weakness)       Time: [id number] PT Time Calculation (min) (ACUTE ONLY): 25 min  Charges:  $Therapeutic Exercise: 23-37 mins $Therapeutic Activity: 8-22  mins $Neuromuscular Re-education: 8-22 mins                    G Codes:      Larae Grooms, PTA 07/10/2016, 1:48 PM

## 2016-07-10 NOTE — Progress Notes (Signed)
CSW spoke to MD about patient. Per MD patient may be medically stable for discharge tomorrow 07/11/16. CSW informed [facility] Admissions Coordinator. CSW will continue to follow and assist.  Ernest Pine, MSW, LCSW, [facility] Social Worker [phone]

## 2016-07-10 NOTE — Progress Notes (Signed)
Subjective:  Creatinine slightly better at 3.6 however BUN higher at 99. Urine output was 1.3 L over the preceding 24 hours. Patient resting comfortably in bed.   Objective:  Vital signs in last 24 hours:  Temp:  [96.9 F (36.1 C)-98.1 F (36.7 C)] 98.1 F (36.7 C) (10/16 1213) Pulse Rate:  [75-78] 75 (10/16 1213) Resp:  [18-20] 18 (10/16 1213) BP: (104-127)/(59-66) 119/61 (10/16 1213) SpO2:  [91 %-100 %] 95 % (10/16 1213)  Weight change:  Filed Weights   07/05/16 0746 07/07/16 1209 07/07/16 1722  Weight: 110.2 kg (243 lb) 110.2 kg (243 lb) 111.5 kg (245 lb 13 oz)    Intake/Output:    Intake/Output Summary (Last 24 hours) at 07/10/16 1332 Last data filed at 07/10/16 1247  Gross per 24 hour  Intake          1115.75 ml  Output             1325 ml  Net          -209.25 ml     Physical Exam: General: Chronically ill-appearing, laying in bed  HEENT Anicteric, moist mucous membranes  Neck supple  Pulm/lungs Normal breathing effort, decreased breath sounds at bases  CVS/Heart no rub or gallop, paced rhythm  Abdomen:  Soft, nontender, nondistended  Extremities: 1+ pitting edema bilaterally, tight edema  Neurologic: Alert, able to answer questions  Skin: No rashes          Basic Metabolic Panel:   Recent Labs Lab 07/06/16 0419 07/07/16 0427 07/08/16 0412 07/09/16 0420 07/10/16 0349  NA 140 136 137 136 136  K 3.5 3.8 3.9 3.6 3.7  CL 97* 97* 98* 99* 100*  CO2 31 29 30 29 29   GLUCOSE 158* 132* 129* 144* 116*  BUN 81* 91* 96* 93* 99*  CREATININE 3.37* 4.02* 4.08* 3.86* 3.63*  CALCIUM 9.6 9.0 9.2 8.8* 8.8*     CBC:  Recent Labs Lab 07/05/16 0756 07/06/16 0419 07/06/16 2042 07/07/16 0427 07/08/16 0412 07/10/16 0349  WBC 9.8 12.9* 17.7* 15.0* 13.4* 7.0  NEUTROABS 8.7*  --   --   --   --   --   HGB 12.0* 12.2* 9.8* 9.8* 8.7* 8.6*  HCT 35.2* 36.3* 29.6* 29.0* 26.5* 25.7*  MCV 89.0 88.5 89.2 89.4 90.3 90.5  PLT 159 176 124* 128* 126* 124*       Microbiology:  Recent Results (from the past 720 hour(s))  Urine culture     Status: Abnormal   Collection Time: 07/05/16  7:59 AM  Result Value Ref Range Status   Specimen Description URINE, RANDOM  Final   Special Requests NONE  Final   Culture >=100,000 COLONIES/mL MORGANELLA MORGANII (A)  Final   Report Status 07/08/2016 FINAL  Final   Organism ID, Bacteria MORGANELLA MORGANII (A)  Final      Susceptibility   Morganella morganii - MIC*    AMPICILLIN >=32 RESISTANT Resistant     CEFAZOLIN >=64 RESISTANT Resistant     CEFTRIAXONE <=1 SENSITIVE Sensitive     CIPROFLOXACIN <=0.25 SENSITIVE Sensitive     GENTAMICIN <=1 SENSITIVE Sensitive     IMIPENEM 4 SENSITIVE Sensitive     NITROFURANTOIN 64 RESISTANT Resistant     TRIMETH/SULFA <=20 SENSITIVE Sensitive     AMPICILLIN/SULBACTAM >=32 RESISTANT Resistant     PIP/TAZO <=4 SENSITIVE Sensitive     * >=100,000 COLONIES/mL MORGANELLA MORGANII  Surgical PCR screen     Status: Abnormal   Collection Time: 07/05/16  1:18 PM  Result Value Ref Range Status   MRSA, PCR POSITIVE (A) NEGATIVE Final    Comment: RESULT CALLED TO, READ BACK BY AND VERIFIED WITH: ERIN CARTER ON 07/05/16 AT 1553 [place]    Staphylococcus aureus POSITIVE (A) NEGATIVE Final    Comment:        The Xpert SA Assay (FDA approved for NASAL specimens in patients over 29 years of age), is one component of a comprehensive surveillance program.  Test performance has been validated by [facility] for patients greater than or equal to 70 year old. It is not intended to diagnose infection nor to guide or monitor treatment.     Coagulation Studies:  Recent Labs  07/08/16 0835 07/09/16 0420 07/10/16 0349  LABPROT 18.3* 17.7* 17.3*  INR 1.50 1.44 1.40    Urinalysis: No results for input(s): COLORURINE, LABSPEC, PHURINE, GLUCOSEU, HGBUR, BILIRUBINUR, KETONESUR, PROTEINUR, UROBILINOGEN, NITRITE, LEUKOCYTESUR in the last 72 hours.  Invalid input(s):  APPERANCEUR    Imaging: No results found.   Medications:   . sodium chloride 40 mL/hr at 07/09/16 1704   . ALPRAZolam  0.5 mg Oral TID  . calcitRIOL  0.25 mcg Oral Daily  . calcium-vitamin D  1 tablet Oral BID  . cefTRIAXone (ROCEPHIN)  IV  1 g Intravenous Q24H  . Chlorhexidine Gluconate Cloth  6 each Topical Q0600  . citalopram  40 mg Oral Daily  . docusate sodium  100 mg Oral BID  . enoxaparin (LOVENOX) injection  1 mg/kg Subcutaneous Q24H  . febuxostat  40 mg Oral QHS  . ferrous sulfate  325 mg Oral BID WC  . mupirocin ointment  1 application Nasal BID  . polyethylene glycol  17 g Oral Daily  . pravastatin  80 mg Oral q1800  . tamsulosin  0.4 mg Oral Daily  . [START ON 07/11/2016] warfarin  2.5 mg Oral Once per day on Tue Thu Sat  . warfarin  5 mg Oral Once per day on Sun Mon Wed Fri  . Warfarin - Pharmacist Dosing Inpatient   Does not apply q1800   acetaminophen **OR** acetaminophen, alum & mag hydroxide-simeth, bisacodyl, cyclobenzaprine, guaiFENesin-dextromethorphan, HYDROcodone-acetaminophen, HYDROmorphone (DILAUDID) injection, ipratropium-albuterol, magnesium citrate, magnesium hydroxide, menthol-cetylpyridinium **OR** phenol, metoCLOPramide **OR** metoCLOPramide (REGLAN) injection, ondansetron **OR** ondansetron (ZOFRAN) IV  Assessment/ Plan:  75 y.o.caucasian male with hypertension, morbid obesity, gout, history of nonsteroidal use, chronic atrial fibrillation, hyperlipidemia,diabetes, depression, ICD, congestive heart failure with LVEF 25%, chronic kidney disease now presents with comminuted intertrochanteric fracture of the left femur  1.  Acute renal failure on chronic kidney disease stage IV Cause of ARF not entirely clear but likely secondary to volume depletion/UTI.  - BUN continues to rise however creatinine is slightly lower.  Diuretics remain on hold.  Patient has been on low rate IV fluids.  We will continue this for now.  If azotemia worsens we may need to  consider temporary dialysis but hold off for now.  2.  Anemia chronic kidney disease.  Hemoglobin currently 8.6.  If hemoglobin continues to drop we may need to consider Epogen.  3.  Lower extremity edema -  Diuretics remain on hold given worsening renal status.    LOS: 5 Monterrio Gerst 10/16/20171:32 PM

## 2016-07-11 DIAGNOSIS — I5032 Chronic diastolic (congestive) heart failure: Secondary | ICD-10-CM

## 2016-07-11 DIAGNOSIS — I4891 Unspecified atrial fibrillation: Secondary | ICD-10-CM

## 2016-07-11 DIAGNOSIS — N184 Chronic kidney disease, stage 4 (severe): Secondary | ICD-10-CM

## 2016-07-11 LAB — BASIC METABOLIC PANEL
Anion gap: 9 (ref 5–15)
BUN: 95 mg/dL — AB (ref 6–20)
CO2: 27 mmol/L (ref 22–32)
CREATININE: 3.37 mg/dL — AB (ref 0.61–1.24)
Calcium: 9.4 mg/dL (ref 8.9–10.3)
Chloride: 101 mmol/L (ref 101–111)
GFR, EST AFRICAN AMERICAN: 19 mL/min — AB (ref >60)
GFR, EST NON AFRICAN AMERICAN: 17 mL/min — AB (ref >60)
Glucose, Bld: 113 mg/dL — ABNORMAL HIGH (ref 65–99)
POTASSIUM: 3.8 mmol/L (ref 3.5–5.1)
SODIUM: 137 mmol/L (ref 135–145)

## 2016-07-11 LAB — CBC
HEMATOCRIT: 24.6 % — AB (ref 40.0–52.0)
Hemoglobin: 8.4 g/dL — ABNORMAL LOW (ref 13.0–18.0)
MCH: 30.3 pg (ref 26.0–34.0)
MCHC: 34.1 g/dL (ref 32.0–36.0)
MCV: 88.7 fL (ref 80.0–100.0)
PLATELETS: 146 10*3/uL — AB (ref 150–440)
RBC: 2.77 MIL/uL — AB (ref 4.40–5.90)
RDW: 14.2 % (ref 11.5–14.5)
WBC: 7.6 10*3/uL (ref 3.8–10.6)

## 2016-07-11 LAB — PHOSPHORUS: PHOSPHORUS: 3.4 mg/dL (ref 2.5–4.6)

## 2016-07-11 LAB — PROTIME-INR
INR: 1.36
Prothrombin Time: 16.9 seconds — ABNORMAL HIGH (ref 11.4–15.2)

## 2016-07-11 MED ORDER — SODIUM CHLORIDE 0.9 % IV SOLN: 100.0000 mL | INTRAVENOUS | Status: DC | PRN

## 2016-07-11 MED ORDER — LIDOCAINE-PRILOCAINE 2.5-2.5 % EX CREA
1.0000 "application " | TOPICAL_CREAM | CUTANEOUS | Status: DC | PRN
  Filled 2016-07-11: qty 5

## 2016-07-11 MED ORDER — LIDOCAINE HCL (PF) 1 % IJ SOLN
5.0000 mL | INTRAMUSCULAR | Status: DC | PRN
  Filled 2016-07-11: qty 5

## 2016-07-11 MED ORDER — WARFARIN SODIUM 2.5 MG PO TABS: 2.5000 mg | ORAL_TABLET | ORAL | Status: DC

## 2016-07-11 MED ORDER — WARFARIN SODIUM 5 MG PO TABS: 5.0000 mg | ORAL_TABLET | Freq: Once | ORAL | Status: DC

## 2016-07-11 MED ORDER — PENTAFLUOROPROP-TETRAFLUOROETH EX AERO
1.0000 "application " | INHALATION_SPRAY | CUTANEOUS | Status: DC | PRN
  Filled 2016-07-11: qty 30

## 2016-07-11 MED ORDER — SENNOSIDES-DOCUSATE SODIUM 8.6-50 MG PO TABS
1.0000 | ORAL_TABLET | Freq: Two times a day (BID) | ORAL | Status: DC
  Administered 2016-07-11 – 2016-07-14 (×5): 1 via ORAL
  Filled 2016-07-11 (×5): qty 1

## 2016-07-11 MED ORDER — ALTEPLASE 2 MG IJ SOLR
2.0000 mg | Freq: Once | INTRAMUSCULAR | Status: DC | PRN
  Filled 2016-07-11: qty 2

## 2016-07-11 MED ORDER — HEPARIN SODIUM (PORCINE) 1000 UNIT/ML DIALYSIS
1000.0000 [IU] | INTRAMUSCULAR | Status: DC | PRN
  Filled 2016-07-11: qty 1

## 2016-07-11 NOTE — Progress Notes (Signed)
PT Cancellation Note  Patient Details Name: Micheal [name] MRN: [id number] DOB: 08-17-1941   Cancelled Treatment:    Reason Eval/Treat Not Completed: Fatigue/lethargy limiting ability to participate;Other (comment). Treatment attempted twice. Initially, MD in the room speaking with daughter. Second attempt, discussion with daughter who notes, pt has been sound asleep all morning. Daughter states discussion with MD regarding pt's continuing kidney function decline, and pt is to begin dialysis. Daughter wishes PT to re attempt later today depending on dialysis schedule. Will re attempt treatment this afternoon.    Larae Grooms, PTA 07/11/2016, 11:38 AM

## 2016-07-11 NOTE — Progress Notes (Signed)
Pre hd assessment  

## 2016-07-11 NOTE — Progress Notes (Signed)
Post hd vitals 

## 2016-07-11 NOTE — Progress Notes (Signed)
Hd start 

## 2016-07-11 NOTE — Op Note (Signed)
  OPERATIVE NOTE   PROCEDURE: 1. Insertion of temporary dialysis catheter catheter right femoral approach.  PRE-OPERATIVE DIAGNOSIS: acute on chronic renal disease  POST-OPERATIVE DIAGNOSIS: Same  SURGEON: Katha Cabal M.D.  ANESTHESIA: 1% lidocaine local infiltration  ESTIMATED BLOOD LOSS: Minimal cc  INDICATIONS:   Micheal [name] is a 75 y.o. male who presents with hip fracture who is Hospital course has now been complicated with acute on chronic renal insufficiency. He will be initiated on dialysis and therefore appropriate access is required. Risk and benefits were reviewed with the patient family all are in agreement with proceeding with temporary catheter insertion..  DESCRIPTION: After obtaining full informed written consent, the patient was positioned supine. The right groin was prepped and draped in a sterile fashion. Ultrasound was placed in a sterile sleeve. Ultrasound was utilized to identify the right femoral vein which is noted to be echolucent and compressible indicating patency. Images recorded for the permanent record. Under real-time visualization a Seldinger needle is inserted into the vein and the guidewires advanced without difficulty. Small counterincision was made at the wire insertion site. Dilator is passed over the wire and the temporary dialysis catheter catheter is fed over the wire without difficulty.  All lumens aspirate and flush easily and are packed with heparin saline. Catheter secured to the skin of the right thigh with 2-0 silk. A sterile dressing is applied with Biopatch.  COMPLICATIONS: None  CONDITION: Unchanged  Katha Cabal, M.D. [facility]. Office:  [phone]

## 2016-07-11 NOTE — Progress Notes (Signed)
Patient refused supp ordered. Consents signed this shift placed in chart for Hemodialysis. MD placed access to left groin. Meds held prior to HD. Lower extremities continue to weep. Fluids continue per MD. Incision well approximated, staples intact.

## 2016-07-11 NOTE — Progress Notes (Signed)
[place] [facility] [place] NAME: Micheal [name]    MR#:  [id number]  DATE OF BIRTH:  1940-12-28  SUBJECTIVE:  CHIEF COMPLAINT:   Chief Complaint  Patient presents with  . Fall  . Leg Pain     Came with a fall and hip fracture.   Also have A fib on coumadin , and CKD.    had hypotension after surgery, now stable.    calm and alert this morning, but somewhat confused. Tolerating diet and working with PT.  REVIEW OF SYSTEMS:   Constitutional: Positive for malaise/fatigue. Negative for chills, fever and weight loss.  HENT: Negative for ear discharge, ear pain, hearing loss and nosebleeds.   Eyes: Negative for blurred vision, double vision and photophobia.  Respiratory: Negative for cough, hemoptysis, shortness of breath and wheezing.   Cardiovascular: Positive for leg swelling. Negative for chest pain, palpitations and orthopnea.  Gastrointestinal: Negative for abdominal pain, constipation, diarrhea, heartburn, melena, nausea and vomiting.  Genitourinary: Negative for dysuria and urgency.  Musculoskeletal: Positive for falls, joint pain and myalgias. Negative for back pain and neck pain.  Skin: Negative for rash.  Neurological: Negative for dizziness, tingling, sensory change, speech change, focal weakness and headaches.  Endo/Heme/Allergies: Does not bruise/bleed easily.  Psychiatric/Behavioral: Negative for depression.   ROS  DRUG ALLERGIES:  No Known Allergies  VITALS:  Blood pressure 117/61, pulse 75, temperature 98.2 F (36.8 C), temperature source Oral, resp. rate 16, height 5\' 10"  (1.778 m), weight 111.5 kg (245 lb 13 oz), SpO2 100 %.  PHYSICAL EXAMINATION:   GENERAL:  75 y.o.-year-old patient lying in the bed with no acute distress. Dissheleved appearing. EYES: Pupils equal, round, reactive to light and accommodation. No scleral icterus. Extraocular muscles intact.  HEENT: Head atraumatic, normocephalic. Oropharynx and nasopharynx  clear.  NECK:  Supple, no jugular venous distention. No thyroid enlargement, no tenderness.  LUNGS: Normal breath sounds bilaterally, no wheezing, rales,rhonchi or crepitation. No use of accessory muscles of respiration. Decreased bibasilar breath sounds. Some coarse coughing during my exam. CARDIOVASCULAR: S1, S2 normal. No  rubs, or gallops. 3/6 systolic murmur in place. ABDOMEN: Soft, nontender, nondistended. Bowel sounds present. No organomegaly or mass.  EXTREMITIES: Left thigh have dressing. Intact neurovascular bundle. No cyanosis, or clubbing. 2+ lower extremity edema with chronic pigmentation changes NEUROLOGIC: Cranial nerves II through XII are intact. Muscle strength 4/5 in all extremities. Sensation intact. Gait not checked.  PSYCHIATRIC: The patient is alert and confused.  SKIN: No obvious rash, lesion, or ulcer.   Physical Exam LABORATORY PANEL:   CBC  Recent Labs Lab 07/11/16 0354  WBC 7.6  HGB 8.4*  HCT 24.6*  PLT 146*   ------------------------------------------------------------------------------------------------------------------  Chemistries   Recent Labs Lab 07/05/16 0756  07/11/16 0354  NA 136  < > 137  K 3.9  < > 3.8  CL 94*  < > 101  CO2 30  < > 27  GLUCOSE 165*  < > 113*  BUN 86*  < > 95*  CREATININE 3.62*  < > 3.37*  CALCIUM 9.9  < > 9.4  AST 19  --   --   ALT 15*  --   --   ALKPHOS 58  --   --   BILITOT 0.7  --   --   < > = values in this interval not displayed. ------------------------------------------------------------------------------------------------------------------  Cardiac Enzymes No results for input(s): TROPONINI in the last 168 hours. ------------------------------------------------------------------------------------------------------------------  RADIOLOGY:  No results  found.  ASSESSMENT AND PLAN:   Active Problems:   Hip fracture (HCC)  #1 Left Hip fracture- secondary to fall. Orthopedics consulted. - Patient was  announced moderate to high risk for surgery - Cardiology consult requested for cardiac clearance. INR is elevated, so held Coumadin. - One dose of oral vitamin K , given one dose  vit K .  - Explained risks to family and also discussed with orthopedic surgeon - Pain medication. 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 On Diuril takes at home. - Hold Lasix and metolazone. Nephrology consulted. - Gentle hydration today in follow-up. Renally adjust all medications. - Checked SPEP and UPEP and PTH as calcium also elevated. - appreciated nephrology help. worsening renal func. - slightly better renal func, still much higher BUN than baseline.  Nephrology suggests to monitor in hospital and may consider starting hemodialysis.  #3 Ac on Chronic systolic CHF- well compensated, EF is 25%   Chronic lower extremity edema. Held Lasix and metolazone   No lasix for now as renal func worsening, and question of how much he will response.  Was  On Gentle IV hydration to help renal func.   IV fluids stopped, stable now.   BP is stable now to have good renal perfusion.   Now nephro is thinking about option to start HD.  #4 Afib- rate controlled, on coreg-  - hold warfarin, s/p pacemaker  Hold COREG due to borderline BP.  Now on therapeutic lovenox.  re-started coumadin.  #5 Gout-continue home medications  #6 BPH- on flomax  #7 DVT Prophylaxis- now INR at 2, start after the surgery  #8 meningioma   incindental finding, discussed with his daughter in room.  # 9 hematuria   and UTI   Ur cx- morgenella, sensitive to Rocephin ( started 07/06/16- stop on 07/12/16.)   Monitor CBC. Hb stable.  # Acute post op anemia due to blood loss   Oral iron.  All the records are reviewed and case discussed with Care Management/Social Workerr. Management plans discussed  with the patient, family and they are in agreement.  CODE STATUS: Full  TOTAL TIME TAKING CARE OF THIS PATIENT: 35 minutes.   POSSIBLE D/C IN 1-2 DAYS, DEPENDING ON CLINICAL CONDITION. Discussed with daughter in room and nephrologist.  Vaughan Basta M.D on 07/11/2016   Between 7am to 6pm - Pager - [phone]  After 6pm go to [email or web address] - password [email or web address] [facility]  Office  [phone]  CC: Primary care physician; [name], MD  Note: This dictation was prepared with Dragon dictation along with smaller phrase technology. Any transcriptional errors that result from this process are unintentional.

## 2016-07-11 NOTE — Consult Note (Signed)
ANTICOAGULATION CONSULT NOTE - Initial Consult  Pharmacy Consult for warfarin Indication: atrial fibrillation  No Known Allergies  Patient Measurements: Height: 5\' 10"  (177.8 cm) Weight: 245 lb 13 oz (111.5 kg) IBW/kg (Calculated) : 73 Heparin Dosing Weight:   Vital Signs: Temp: 98.2 F (36.8 C) (10/17 0518) Temp Source: Oral (10/17 0518) BP: 120/68 (10/17 0518) Pulse Rate: 75 (10/17 0518)  Labs:  Recent Labs  07/09/16 0420 07/10/16 0349 07/11/16 0354  HGB  --  8.6* 8.4*  HCT  --  25.7* 24.6*  PLT  --  124* 146*  LABPROT 17.7* 17.3* 16.9*  INR 1.44 1.40 1.36  CREATININE 3.86* 3.63* 3.37*    Estimated Creatinine Clearance: 24.4 mL/min (by C-G formula based on SCr of 3.37 mg/dL (H)).   Medical History: Past Medical History:  Diagnosis Date  . Anxiety   . Atrial fibrillation ([place])   . 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 CAD (coronary artery disease)   . CHF (congestive heart failure) ([place])   . Chronic back pain   . Chronic systolic heart failure (HCC) ~2009   EF under 25%  . CKD (chronic kidney disease) stage 3, GFR 30-59 ml/min 2009   stage III  . Dyspnea    with exertion  . Fracture closed, humerus    never hadsurgery, left  . Gout, unspecified   . Hypertension   . Myocardial infarction    pt is unaware of this but doctors say he has  . Other left bundle branch block   . Thrombocytopenia (HCC)     Medications:  Scheduled:  . ALPRAZolam  0.5 mg Oral TID  . bisacodyl  10 mg Rectal Daily  . calcitRIOL  0.25 mcg Oral Daily  . calcium-vitamin [name]  1 tablet Oral BID  . cefTRIAXone (ROCEPHIN)  IV  1 g Intravenous Q24H  . citalopram  40 mg Oral Daily  . docusate sodium  100 mg Oral BID  . enoxaparin (LOVENOX) injection  1 mg/kg Subcutaneous Q24H  . febuxostat  40 mg Oral QHS  . ferrous sulfate  325 mg Oral BID WC  . polyethylene glycol  17 g Oral Daily  . pravastatin  80 mg Oral q1800  . tamsulosin  0.4 mg Oral Daily   . warfarin  2.5 mg Oral Once per day on Tue Thu Sat  . warfarin  5 mg Oral Once per day on Sun Mon Wed Fri  . Warfarin - Pharmacist Dosing Inpatient   Does not apply q1800    Assessment: Pt is a 75 year old male w/ a PMH of Afib on warfarin at home. Pt is s/p left hip surgery. INR is subtherapeutic at 1.5. Pt received a total of 10mg  vit K (5mg  po, 5mg  Iv). Pharmacy consulted to restart warfarin. Patient on Lovenox bridge.  Pt home dose is 5mg  Sun,MWF and 2.5 T,T,sat. Pt last warfarin dose was 10/10  Date  INR  Warfarin Dose 10/11  2.01  0, vit K 5mg  PO 10/12  2.01  0, vit K 5mg  IV 10/13  1.79  none 10/14  1.50   5 mg 10/15  1.44  5 mg 10/16  1.40                 5 mg 10/17            1.36  Goal of Therapy:  INR 2-3  Monitor platelets by anticoagulation protocol: Yes   Plan:  INR slow  to increase as expected after Vitamin K. Will order warfarin 5 mg today instead of 2.5 mg and plan to continue with home regimen subsequently depending on INR.   Micheal [name] 07/11/2016,7:36 AM

## 2016-07-11 NOTE — Progress Notes (Signed)
Subjective:  Renal function has not improved significantly.  We talked about temporary dialysis and the pt is willing to proceed. He has been fairly lethargic today.   Objective:  Vital signs in last 24 hours:  Temp:  [98.2 F (36.8 C)-98.6 F (37 C)] 98.6 F (37 C) (10/17 1445) Pulse Rate:  [75] 75 (10/17 1445) Resp:  [15-19] 16 (10/17 0752) BP: (102-120)/(54-68) 102/68 (10/17 1445) SpO2:  [91 %-100 %] 97 % (10/17 1445)  Weight change:  Filed Weights   07/05/16 0746 07/07/16 1209 07/07/16 1722  Weight: 110.2 kg (243 lb) 110.2 kg (243 lb) 111.5 kg (245 lb 13 oz)    Intake/Output:    Intake/Output Summary (Last 24 hours) at 07/11/16 1543 Last data filed at 07/11/16 1459  Gross per 24 hour  Intake           399.33 ml  Output             1275 ml  Net          -875.67 ml     Physical Exam: General: Chronically ill-appearing, laying in bed  HEENT Anicteric, moist mucous membranes  Neck supple  Pulm/lungs Normal breathing effort, decreased breath sounds at bases  CVS/Heart no rub or gallop, paced rhythm  Abdomen:  Soft, nontender, nondistended  Extremities: 1+ pitting edema bilaterally, tight edema  Neurologic: Alert, able to answer questions  Skin: No rashes          Basic Metabolic Panel:   Recent Labs Lab 07/07/16 0427 07/08/16 0412 07/09/16 0420 07/10/16 0349 07/11/16 0354  NA 136 137 136 136 137  K 3.8 3.9 3.6 3.7 3.8  CL 97* 98* 99* 100* 101  CO2 29 30 29 29 27   GLUCOSE 132* 129* 144* 116* 113*  BUN 91* 96* 93* 99* 95*  CREATININE 4.02* 4.08* 3.86* 3.63* 3.37*  CALCIUM 9.0 9.2 8.8* 8.8* 9.4     CBC:  Recent Labs Lab 07/05/16 0756  07/06/16 2042 07/07/16 0427 07/08/16 0412 07/10/16 0349 07/11/16 0354  WBC 9.8  < > 17.7* 15.0* 13.4* 7.0 7.6  NEUTROABS 8.7*  --   --   --   --   --   --   HGB 12.0*  < > 9.8* 9.8* 8.7* 8.6* 8.4*  HCT 35.2*  < > 29.6* 29.0* 26.5* 25.7* 24.6*  MCV 89.0  < > 89.2 89.4 90.3 90.5 88.7  PLT 159  < > 124* 128*  126* 124* 146*  < > = values in this interval not displayed.    Microbiology:  Recent Results (from the past 720 hour(s))  Urine culture     Status: Abnormal   Collection Time: 07/05/16  7:59 AM  Result Value Ref Range Status   Specimen Description URINE, RANDOM  Final   Special Requests NONE  Final   Culture >=100,000 COLONIES/mL MORGANELLA MORGANII (A)  Final   Report Status 07/08/2016 FINAL  Final   Organism ID, Bacteria MORGANELLA MORGANII (A)  Final      Susceptibility   Morganella morganii - MIC*    AMPICILLIN >=32 RESISTANT Resistant     CEFAZOLIN >=64 RESISTANT Resistant     CEFTRIAXONE <=1 SENSITIVE Sensitive     CIPROFLOXACIN <=0.25 SENSITIVE Sensitive     GENTAMICIN <=1 SENSITIVE Sensitive     IMIPENEM 4 SENSITIVE Sensitive     NITROFURANTOIN 64 RESISTANT Resistant     TRIMETH/SULFA <=20 SENSITIVE Sensitive     AMPICILLIN/SULBACTAM >=32 RESISTANT Resistant  PIP/TAZO <=4 SENSITIVE Sensitive     * >=100,000 COLONIES/mL MORGANELLA MORGANII  Surgical PCR screen     Status: Abnormal   Collection Time: 07/05/16  1:18 PM  Result Value Ref Range Status   MRSA, PCR POSITIVE (A) NEGATIVE Final    Comment: RESULT CALLED TO, READ BACK BY AND VERIFIED WITH: ERIN CARTER ON 07/05/16 AT 1553 [place]    Staphylococcus aureus POSITIVE (A) NEGATIVE Final    Comment:        The Xpert SA Assay (FDA approved for NASAL specimens in patients over 48 years of age), is one component of a comprehensive surveillance program.  Test performance has been validated by [facility] for patients greater than or equal to 9 year old. It is not intended to diagnose infection nor to guide or monitor treatment.     Coagulation Studies:  Recent Labs  07/09/16 0420 07/10/16 0349 07/11/16 0354  LABPROT 17.7* 17.3* 16.9*  INR 1.44 1.40 1.36    Urinalysis: No results for input(s): COLORURINE, LABSPEC, PHURINE, GLUCOSEU, HGBUR, BILIRUBINUR, KETONESUR, PROTEINUR, UROBILINOGEN, NITRITE,  LEUKOCYTESUR in the last 72 hours.  Invalid input(s): APPERANCEUR    Imaging: No results found.   Medications:   . sodium chloride 40 mL/hr at 07/11/16 1330   . ALPRAZolam  0.5 mg Oral TID  . bisacodyl  10 mg Rectal Daily  . calcitRIOL  0.25 mcg Oral Daily  . calcium-vitamin D  1 tablet Oral BID  . cefTRIAXone (ROCEPHIN)  IV  1 g Intravenous Q24H  . citalopram  40 mg Oral Daily  . docusate sodium  100 mg Oral BID  . enoxaparin (LOVENOX) injection  1 mg/kg Subcutaneous Q24H  . febuxostat  40 mg Oral QHS  . ferrous sulfate  325 mg Oral BID WC  . polyethylene glycol  17 g Oral Daily  . pravastatin  80 mg Oral q1800  . tamsulosin  0.4 mg Oral Daily  . [START ON 07/13/2016] warfarin  2.5 mg Oral Once per day on Tue Thu Sat  . warfarin  5 mg Oral Once per day on Sun Mon Wed Fri  . warfarin  5 mg Oral ONCE-1800  . Warfarin - Pharmacist Dosing Inpatient   Does not apply q1800   acetaminophen **OR** acetaminophen, alum & mag hydroxide-simeth, bisacodyl, cyclobenzaprine, guaiFENesin-dextromethorphan, HYDROcodone-acetaminophen, HYDROmorphone (DILAUDID) injection, ipratropium-albuterol, magnesium citrate, magnesium hydroxide, menthol-cetylpyridinium **OR** phenol, metoCLOPramide **OR** metoCLOPramide (REGLAN) injection, ondansetron **OR** ondansetron (ZOFRAN) IV  Assessment/ Plan:  75 y.o.caucasian male with hypertension, morbid obesity, gout, history of nonsteroidal use, chronic atrial fibrillation, hyperlipidemia,diabetes, depression, ICD, congestive heart failure with LVEF 25%, chronic kidney disease now presents with comminuted intertrochanteric fracture of the left femur  1.  Acute renal failure on chronic kidney disease stage IV Cause of ARF not entirely clear but likely secondary to volume depletion/UTI.  - Renal function still less than optimal, patient stated in the past that he did not want long term dialysis but he is open to short term time limited trial of dialysis.  Uremia  could be playing a role in his mental status now.  Vascular surgery consulted to place temporary dialysis catheter.    2.  Anemia chronic kidney disease.  hgb currently 8.6, continue to monitor, will consider epogen with dialysis.    3.  Lower extremity edema -  Hold off on lasix for now.    LOS: 6 Micheal [name] 10/17/20173:43 PM

## 2016-07-11 NOTE — Consult Note (Signed)
[facility] Vascular Consult Note  MRN : [id number]  Micheal [name] is a 75 y.o. ([birth year]) male who presents with chief complaint of  Chief Complaint  Patient presents with  . Fall  . Leg Pain  .  History of Present Illness: I am asked to evaluate the patient by Dr. Holley Raring for dialysis access.  The patient is a 75 y.o. male with a known history of Multiple medical problems including atrial fibrillation and sick sinus syndrome status post pacemaker, chronic systolic CHF EF of [id number] status post defibrillator, CK D stage IV with baseline GFR of 28, creatinine of 2.2, chronic back pain and gout presented  from home 6 days ago secondary to fall and left hip fracture. Over the course of his hospitalization he has become progressively more uremic likely based on volume depletion as well as his urinary tract infection. As his renal function has deteriorated he is become increasingly lethargic. He has had lengthy discussion with Dr. Holley Raring and has agreed to a trial of hemodialysis.  Current Facility-Administered Medications  Medication Dose Route Frequency Provider Last Rate Last Dose  . 0.9 %  sodium chloride infusion   Intravenous Continuous Harmeet Singh, MD 40 mL/hr at 07/11/16 1330    . acetaminophen (TYLENOL) tablet 650 mg  650 mg Oral Q6H PRN Gladstone Lighter, MD   650 mg at 07/07/16 1849   Or  . acetaminophen (TYLENOL) suppository 650 mg  650 mg Rectal Q6H PRN Gladstone Lighter, MD      . ALPRAZolam Duanne Moron) tablet 0.5 mg  0.5 mg Oral TID Vaughan Basta, MD   0.5 mg at 07/11/16 0902  . alum & mag hydroxide-simeth (MAALOX/MYLANTA) 200-200-20 MG/5ML suspension 30 mL  30 mL Oral Q4H PRN Hessie Knows, MD      . bisacodyl (DULCOLAX) suppository 10 mg  10 mg Rectal Daily PRN Hessie Knows, MD      . bisacodyl (DULCOLAX) suppository 10 mg  10 mg Rectal Daily Vaughan Basta, MD   10 mg at 07/10/16 1420  . calcitRIOL (ROCALTROL) capsule 0.25 mcg  0.25 mcg Oral  Daily Gladstone Lighter, MD   0.25 mcg at 07/11/16 0902  . calcium-vitamin D (OSCAL WITH D) 500-200 MG-UNIT per tablet 1 tablet  1 tablet Oral BID Vaughan Basta, MD   1 tablet at 07/11/16 0902  . cefTRIAXone (ROCEPHIN) 1 g in dextrose 5 % 50 mL IVPB  1 g Intravenous Q24H Vaughan Basta, MD   1 g at 07/11/16 1330  . citalopram (CELEXA) tablet 40 mg  40 mg Oral Daily Gladstone Lighter, MD   40 mg at 07/11/16 0902  . cyclobenzaprine (FLEXERIL) tablet 5 mg  5 mg Oral BID PRN Gladstone Lighter, MD   5 mg at 07/06/16 1922  . docusate sodium (COLACE) capsule 100 mg  100 mg Oral BID Hessie Knows, MD   100 mg at 07/11/16 0902  . enoxaparin (LOVENOX) injection 110 mg  1 mg/kg Subcutaneous Q24H Ramond Dial, RPH   Stopped at 07/11/16 1300  . febuxostat (ULORIC) tablet 40 mg  40 mg Oral QHS Vaughan Basta, MD   40 mg at 07/10/16 1747  . ferrous sulfate tablet 325 mg  325 mg Oral BID WC Vaughan Basta, MD   325 mg at 07/11/16 0902  . guaiFENesin-dextromethorphan (ROBITUSSIN DM) 100-10 MG/5ML syrup 5 mL  5 mL Oral Q4H PRN Vaughan Basta, MD   5 mL at 07/06/16 1145  . HYDROcodone-acetaminophen (NORCO) 10-325 MG per tablet 1 tablet  1 tablet Oral Q4H PRN Lattie Corns, PA-C   1 tablet at 07/10/16 1904  . HYDROmorphone (DILAUDID) injection 1 mg  1 mg Intravenous Q2H PRN Hessie Knows, MD   1 mg at 07/06/16 1146  . ipratropium-albuterol (DUONEB) 0.5-2.5 (3) MG/3ML nebulizer solution 3 mL  3 mL Nebulization Q4H PRN Vaughan Basta, MD      . magnesium citrate solution 1 Bottle  1 Bottle Oral Once PRN Hessie Knows, MD      . magnesium hydroxide (MILK OF MAGNESIA) suspension 30 mL  30 mL Oral Daily PRN Hessie Knows, MD   30 mL at 07/09/16 1616  . menthol-cetylpyridinium (CEPACOL) lozenge 3 mg  1 lozenge Oral PRN Hessie Knows, MD       Or  . phenol [facility]) mouth spray 1 spray  1 spray Mouth/Throat PRN Hessie Knows, MD      . metoCLOPramide (REGLAN) tablet 5-10 mg   5-10 mg Oral Q8H PRN Hessie Knows, MD       Or  . metoCLOPramide (REGLAN) injection 5-10 mg  5-10 mg Intravenous Q8H PRN Hessie Knows, MD      . ondansetron [facility]) tablet 4 mg  4 mg Oral Q6H PRN Gladstone Lighter, MD       Or  . ondansetron (ZOFRAN) injection 4 mg  4 mg Intravenous Q6H PRN Gladstone Lighter, MD   4 mg at 07/06/16 1655  . polyethylene glycol (MIRALAX / GLYCOLAX) packet 17 g  17 g Oral Daily Gladstone Lighter, MD   17 g at 07/11/16 0903  . pravastatin (PRAVACHOL) tablet 80 mg  80 mg Oral q1800 Gladstone Lighter, MD   80 mg at 07/10/16 1747  . tamsulosin (FLOMAX) capsule 0.4 mg  0.4 mg Oral Daily Gladstone Lighter, MD   0.4 mg at 07/10/16 2101  . [START ON 07/13/2016] warfarin (COUMADIN) tablet 2.5 mg  2.5 mg Oral Once per day on Tue Thu Sat Napoleon Form, [facility]      . warfarin (COUMADIN) tablet 5 mg  5 mg Oral Once per day on Sun Mon Wed Fri Ramond Dial, RPH   5 mg at 07/10/16 1747  . warfarin (COUMADIN) tablet 5 mg  5 mg Oral ONCE-1800 Napoleon Form, RPH      . Warfarin - Pharmacist Dosing Inpatient   Does not apply [place], [facility]        Past Medical History:  Diagnosis Date  . Anxiety   . Atrial fibrillation ([place])   . 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 CAD (coronary artery disease)   . CHF (congestive heart failure) ([place])   . Chronic back pain   . Chronic systolic heart failure (HCC) ~2009   EF under 25%  . CKD (chronic kidney disease) stage 3, GFR 30-59 ml/min 2009   stage III  . Dyspnea    with exertion  . Fracture closed, humerus    never hadsurgery, left  . Gout, unspecified   . Hypertension   . Myocardial infarction    pt is unaware of this but doctors say he has  . Other left bundle branch block   . Thrombocytopenia ([place])     Past Surgical History:  Procedure Laterality Date  . Fracture of left humerus     Rebroken 12/13. never had surgery  . ICD implant     St. Jude [place]; 12/27/07-CHF, afib,  LBB, 3 vessel disease  . INTRAMEDULLARY (IM) NAIL INTERTROCHANTERIC Left  07/07/2016   Procedure: INTRAMEDULLARY (IM) NAIL INTERTROCHANTRIC;  Surgeon: Hessie Knows, MD;  Location: [facility] ORS;  Service: Orthopedics;  Laterality: Left;    Social History Social History  Substance Use Topics  . Smoking status: Former Smoker    Quit date: 09/25/1993  . Smokeless tobacco: Never Used     Comment: quit 15 years ago  . Alcohol use No     Comment: past use but not currently    Family History Family History  Problem Relation Age of Onset  . Heart failure Mother   . CAD Father   . Diabetes Mellitus II Father   No family history of bleeding clotting disorders, porphyria or autoimmune disease  No Known Allergies   REVIEW OF SYSTEMS (Negative unless checked)  Constitutional: [] Weight loss  [] Fever  [] Chills Cardiac: [] Chest pain   [] Chest pressure   [] Palpitations   [] Shortness of breath when laying flat   [] Shortness of breath at rest   [x] Shortness of breath with exertion. Vascular:  [] Pain in legs with walking   [] Pain in legs at rest   [] Pain in legs when laying flat   [] Claudication   [] Pain in feet when walking  [] Pain in feet at rest  [] Pain in feet when laying flat   [] History of DVT   [] Phlebitis   [x] Swelling in legs   [] Varicose veins   [] Non-healing ulcers Pulmonary:   [] Uses home oxygen   [] Productive cough   [] Hemoptysis   [x] Wheeze  [] COPD   [] Asthma Neurologic:  [] Dizziness  [] Blackouts   [] Seizures   [] History of stroke   [] History of TIA  [] Aphasia   [] Temporary blindness   [] Dysphagia   [] Weakness or numbness in arms   [] Weakness or numbness in legs Musculoskeletal:  [] Arthritis   [] Joint swelling   [] Joint pain   [] Low back pain Hematologic:  [] Easy bruising  [] Easy bleeding   [] Hypercoagulable state   [] Anemic  [] Hepatitis Gastrointestinal:  [] Blood in stool   [] Vomiting blood  [] Gastroesophageal reflux/heartburn   [] Difficulty swallowing. Genitourinary:  [x] Chronic kidney  disease   [] Difficult urination  [] Frequent urination  [] Burning with urination   [] Blood in urine Skin:  [] Rashes   [] Ulcers   [] Wounds Psychological:  [] History of anxiety   []  History of major depression.  Physical Examination  Vitals:   07/10/16 2335 07/11/16 0518 07/11/16 0752 07/11/16 1445  BP: (!) 108/54 120/68 117/61 102/68  Pulse: 75 75 75 75  Resp: 19 15 16    Temp: 98.4 F (36.9 C) 98.2 F (36.8 C) 98.2 F (36.8 C) 98.6 F (37 C)  TempSrc: Oral Oral Oral Oral  SpO2: 92% 99% 100% 97%  Weight:      Height:       Body mass index is 35.27 kg/m. Gen:  WD/WN, NAD Head: [place]/AT, No temporalis wasting. Prominent temp pulse not noted. Ear/Nose/Throat: Hearing grossly intact, nares w/o erythema or drainage, oropharynx w/o Erythema/Exudate Eyes: PERRLA, EOMI.  Neck: Supple, no nuchal rigidity.  No bruit or JVD.  Pulmonary:  Good air movement, clear to auscultation bilaterally.  Cardiac: RRR, normal S1, S2. Vascular:  Vessel Right Left  Radial Palpable Palpable  Ulnar Palpable Palpable  Brachial Palpable Palpable  Carotid Palpable, without bruit Palpable, without bruit  Aorta Not palpable N/A  Femoral Palpable Palpable  Gastrointestinal: soft, non-tender/non-distended. No guarding/reflex. Musculoskeletal: M/S 4/5 throughout.  Extremities without ischemic changes.  No deformity or atrophy. 3+ edema. Neurologic: CN 2-12 intact. Pain and light touch intact in extremities.  Symmetrical.  Speech is fluent. Motor exam as listed above. Psychiatric: lethargic, Mood & affect appropriate for pt's clinical situation. Dermatologic: No rashes or ulcers noted.  No cellulitis or open wounds.   CBC Lab Results  Component Value Date   WBC 7.6 07/11/2016   HGB 8.4 (L) 07/11/2016   HCT 24.6 (L) 07/11/2016   MCV 88.7 07/11/2016   PLT 146 (L) 07/11/2016    BMET    Component Value Date/Time   NA 137 07/11/2016 0354   NA 137 09/10/2014 0653   K 3.8 07/11/2016 0354   K 3.4 (L)  09/10/2014 0653   CL 101 07/11/2016 0354   CL 103 09/10/2014 0653   CO2 27 07/11/2016 0354   CO2 28 09/10/2014 0653   GLUCOSE 113 (H) 07/11/2016 0354   GLUCOSE 152 (H) 09/10/2014 0653   BUN 95 (H) 07/11/2016 0354   BUN 64 (H) 09/10/2014 0653   CREATININE 3.37 (H) 07/11/2016 0354   CREATININE 2.44 (H) 09/10/2014 0653   CALCIUM 9.4 07/11/2016 0354   CALCIUM 9.2 09/10/2014 0653   GFRNONAA 17 (L) 07/11/2016 0354   GFRNONAA 28 (L) 09/10/2014 0653   GFRNONAA 35 (L) 04/18/2013 0429   GFRAA 19 (L) 07/11/2016 0354   GFRAA 34 (L) 09/10/2014 0653   GFRAA 40 (L) 04/18/2013 0429   Estimated Creatinine Clearance: 24.4 mL/min (by C-G formula based on SCr of 3.37 mg/dL (H)).  COAG Lab Results  Component Value Date   INR 1.36 07/11/2016   INR 1.40 07/10/2016   INR 1.44 07/09/2016    Radiology Dg Chest 1 View  Result Date: 07/07/2016 CLINICAL DATA:  Hypoxia, shortness of breath EXAM: CHEST 1 VIEW COMPARISON:  07/05/2016 FINDINGS: There is bilateral interstitial thickening and prominence of the central pulmonary vasculature. There is no focal parenchymal opacity. There is no pleural effusion or pneumothorax. There is stable cardiomegaly. There is a 3 lead AICD. The osseous structures are unremarkable. IMPRESSION: Cardiomegaly with pulmonary vascular congestion. Electronically Signed   By: Kathreen Devoid   On: 07/07/2016 10:40   Dg Chest 2 View  Result Date: 07/05/2016 CLINICAL DATA:  Fall.  Left hip pain.  On Coumadin. EXAM: CHEST  2 VIEW COMPARISON:  04/15/2013 FINDINGS: Cardiac enlargement. Internal defibrillator unchanged. Mild vascular congestion. Negative for edema or effusion. Lungs are clear without infiltrate. IMPRESSION: No active cardiopulmonary disease. Electronically Signed   By: Franchot Gallo M.D.   On: 07/05/2016 09:05   Dg Ankle 2 Views Left  Result Date: 07/05/2016 CLINICAL DATA:  Pain following fall EXAM: LEFT ANKLE - 2 VIEW COMPARISON:  None. FINDINGS: Frontal and lateral  views were obtained. There is mild generalized soft tissue swelling. There is evidence of old trauma in the medial malleolar region with remodeling. There is no evident acute fracture or joint effusion. The ankle mortise appears intact. There is mild narrowing in the lateral aspect of the ankle joint with mild spurring along the anterior aspect of the tibial plafond. There are spurs arising from the posterior and inferior calcaneus. IMPRESSION: Old trauma medial malleolus. No acute fracture. Ankle mortise appears intact. Evidence of osteoarthritic change laterally. Calcaneal spurs noted. There is mild generalized soft tissue swelling. Electronically Signed   By: Lowella Grip III M.D.   On: 07/05/2016 09:06   Ct Head Wo Contrast  Result Date: 07/07/2016 CLINICAL DATA:  Initial evaluation for pupil asymmetry following hip surgery today. EXAM: CT HEAD WITHOUT CONTRAST TECHNIQUE: Contiguous axial images were obtained from the base of the skull through the  vertex without intravenous contrast. COMPARISON:  Prior CT from 07/05/2016. FINDINGS: Brain: Generalized cerebral atrophy with moderate chronic microvascular ischemic changes again noted. Scattered remote lacunar infarcts noted within the bilateral basal ganglia, stable. No acute intracranial hemorrhage. No evidence for acute large vessel territory infarct. Gray-white matter deficient maintained. Deep gray nuclei maintained. No insular ribbon sign. 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 No associated edema. No other mass lesion. No midline shift or mass effect. No hydrocephalus. No extra-axial fluid collection. Vascular: No hyperdense vessel. Scattered vascular calcifications present within the carotid siphons. Skull: Scalp soft tissues within normal limits.  Calvarium intact. Sinuses/Orbits: Globes and oral soft tissues within normal limits. Paranasal sinuses are clear. Trace right mastoid effusion. Left mastoid air cells clear. IMPRESSION: 1.  No acute intracranial process identified. 2. Stable atrophy with chronic microvascular ischemic disease. Electronically Signed   By: Jeannine Boga M.D.   On: 07/07/2016 22:28   Ct Head Wo Contrast  Result Date: 07/05/2016 CLINICAL DATA:  Head injury. On Coumadin. Evaluate for intracranial hemorrhage. EXAM: CT HEAD WITHOUT CONTRAST TECHNIQUE: Contiguous axial images were obtained from the base of the skull through the vertex without intravenous contrast. COMPARISON:  09/16/2012 FINDINGS: Brain: Moderate low density in the periventricular white matter likely related to small vessel disease. No hemorrhage, hydrocephalus, acute infarct, intra-axial, or extra-axial fluid collection. Meningioma along the left anterior falx measures 9 mm on image 16/series 2. No significant mass effect. Vascular: Bilateral intracranial carotid and left vertebral artery atherosclerosis. Skull: No significant soft tissue swelling.  No skull fracture. Sinuses/Orbits: Normal orbits and globes. Clear paranasal sinuses and mastoid air cells. Other: None IMPRESSION: 1. No acute or posttraumatic deformity identified 2. Moderate small vessel ischemic change. 3. 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 Electronically Signed   By: Abigail Miyamoto M.D.   On: 07/05/2016 08:44   Dg C-arm 61-120 Min  Result Date: 07/07/2016 CLINICAL DATA:  Status post ORIF of left hip. EXAM: DG C-ARM 61-120 MIN; OPERATIVE LEFT HIP WITH PELVIS COMPARISON:  None. FINDINGS: Seven images of the left femur were obtained via portable C-arm radiography and submitted for interpretation. Images show open reduction and hardware fixation of the intertrochanteric fracture involving the proximal left femur. An intra medullary rod and hip screw has been placed. IMPRESSION: 1. Status post ORIF of the proximal left femur. Electronically Signed   By: Kerby Moors M.D.   On: 07/07/2016 14:46   Dg Hip  Operative Unilat With Pelvis Left  Result Date: 07/07/2016 CLINICAL DATA:  Status post ORIF of left hip. EXAM: DG C-ARM 61-120 MIN; OPERATIVE LEFT HIP WITH PELVIS COMPARISON:  None. FINDINGS: Seven images of the left femur were obtained via portable C-arm radiography and submitted for interpretation. Images show open reduction and hardware fixation of the intertrochanteric fracture involving the proximal left femur. An intra medullary rod and hip screw has been placed. IMPRESSION: 1. Status post ORIF of the proximal left femur. Electronically Signed   By: Kerby Moors M.D.   On: 07/07/2016 14:46   Dg Hip Unilat W Or Wo Pelvis 2-3 Views Left  Result Date: 07/05/2016 CLINICAL DATA:  Pain following fall EXAM: DG HIP (WITH OR WITHOUT PELVIS) 2-3V LEFT COMPARISON:  None. FINDINGS: Frontal pelvis as well as frontal and lateral left hip images were obtained. There is a comminuted fracture of the intertrochanteric femur region on the left with varus angulation at the fracture site. There is avulsion of  the left lesser trochanter medially. No other fractures are evident. No dislocation. There is mild symmetric narrowing of both hip joints. There is extensive arterial vascular calcification in the iliac and superficial femoral arteries. Bones appear osteoporotic. IMPRESSION: Comminuted intertrochanteric femur fracture on the left with varus angulation at the fracture site and medial avulsion of the lesser trochanter. No other fractures are evident. Mild symmetric narrowing both hip joints. Bones osteoporotic. No dislocation. Extensive iliac and femoral artery atherosclerosis. Electronically Signed   By: Lowella Grip III M.D.   On: 07/05/2016 09:05      Assessment/Plan #1 ARF on CKD stage 4- baseline creatinine at 2.2. His renal function has now deteriorated and he uremic and very symptomatic.  Given that he is not sure he will continue HD long term and his over all condition at this time is rather poor I  will place a temporary catheter.   #2 Left Hip fracture- secondary to fall. Orthopedics consulted. - Patient is s/p left IM nail 07/07/2016  #3 Ac on Chronic systolic CHF- well compensated, EF is 25%   Chronic lower extremity edema. Held Lasix and metolazone   No lasix for now as renal func worsening, and question of how much he will response.   #4 Afib- rate controlled, on coreg-  - hold warfarin, s/p pacemaker  Hold COREG due to borderline BP.  Now on therapeutic lovenox.  re-started coumadin.  #5 Gout-continue home medications  #6 BPH- on flomax  #7 meningioma   incindental finding, discussed with his daughter in room.   Hortencia Pilar, MD  07/11/2016 3:11 PM    This note was created with Dragon medical transcription system.  Any error is purely unintentional

## 2016-07-11 NOTE — Progress Notes (Addendum)
PT Cancellation Note  Patient Details Name: Micheal [name] MRN: [id number] DOB: 07-12-1941   Cancelled Treatment:    Reason Eval/Treat Not Completed: Patient declined, no reason specified. Treatment attempted; pt refuses despite encouragement. Pt very lethargic. Spoke with regarding pt's progress and how strongly PT should encourage pt. Nursing states he needs to try an work with PT; pt is supposed to initiate dialysis today. Re attempt treatment tomorrow.    Larae Grooms, PTA 07/11/2016, 3:16 PM

## 2016-07-11 NOTE — Progress Notes (Signed)
Post hd assessment 

## 2016-07-11 NOTE — Progress Notes (Signed)
Pre hd info 

## 2016-07-11 NOTE — Progress Notes (Signed)
End of hd tx  

## 2016-07-12 DIAGNOSIS — T82858A Stenosis of vascular prosthetic devices, implants and grafts, initial encounter: Secondary | ICD-10-CM

## 2016-07-12 DIAGNOSIS — N186 End stage renal disease: Secondary | ICD-10-CM

## 2016-07-12 LAB — BASIC METABOLIC PANEL
ANION GAP: 7 (ref 5–15)
BUN: 68 mg/dL — ABNORMAL HIGH (ref 6–20)
CALCIUM: 9.6 mg/dL (ref 8.9–10.3)
CO2: 29 mmol/L (ref 22–32)
CREATININE: 2.48 mg/dL — AB (ref 0.61–1.24)
Chloride: 104 mmol/L (ref 101–111)
GFR, EST AFRICAN AMERICAN: 28 mL/min — AB (ref >60)
GFR, EST NON AFRICAN AMERICAN: 24 mL/min — AB (ref >60)
Glucose, Bld: 107 mg/dL — ABNORMAL HIGH (ref 65–99)
Potassium: 3.6 mmol/L (ref 3.5–5.1)
SODIUM: 140 mmol/L (ref 135–145)

## 2016-07-12 LAB — HEPATITIS B SURFACE ANTIGEN: Hepatitis B Surface Ag: NEGATIVE

## 2016-07-12 LAB — PROTIME-INR
INR: 1.42
PROTHROMBIN TIME: 17.5 s — AB (ref 11.4–15.2)

## 2016-07-12 LAB — MAGNESIUM: MAGNESIUM: 2.9 mg/dL — AB (ref 1.7–2.4)

## 2016-07-12 LAB — PHOSPHORUS: PHOSPHORUS: 2.8 mg/dL (ref 2.5–4.6)

## 2016-07-12 LAB — PARATHYROID HORMONE, INTACT (NO CA): PTH: 54 pg/mL (ref 15–65)

## 2016-07-12 LAB — HEPATITIS B SURFACE ANTIBODY,QUALITATIVE: Hep B S Ab: NONREACTIVE

## 2016-07-12 LAB — HEPATITIS B CORE ANTIBODY, TOTAL: Hep B Core Total Ab: NEGATIVE

## 2016-07-12 MED ORDER — WARFARIN SODIUM 5 MG PO TABS
6.0000 mg | ORAL_TABLET | Freq: Once | ORAL | Status: AC
  Administered 2016-07-12: 6 mg via ORAL
  Filled 2016-07-12: qty 1

## 2016-07-12 MED ORDER — MUPIROCIN 2 % EX OINT
1.0000 "application " | TOPICAL_OINTMENT | Freq: Two times a day (BID) | CUTANEOUS | Status: AC
  Administered 2016-07-12 – 2016-07-16 (×10): 1 via NASAL
  Filled 2016-07-12: qty 22

## 2016-07-12 MED ORDER — CHLORHEXIDINE GLUCONATE CLOTH 2 % EX PADS: 6.0000 | MEDICATED_PAD | Freq: Every day | CUTANEOUS | Status: AC

## 2016-07-12 NOTE — Progress Notes (Signed)
PT Cancellation Note  Patient Details Name: Micheal [name] MRN: [id number] DOB: 1941-01-15   Cancelled Treatment:    Reason Eval/Treat Not Completed: Medical issues which prohibited therapy. Per chart review and checking with nurse, pt has partial removal of temporary femoral catheter, which will probably require procedure to be re done. PT held. Will check with nursing in afternoon for pt status.    Larae Grooms, PTA 07/12/2016, 12:05 PM

## 2016-07-12 NOTE — Care Management Important Message (Signed)
Important Message  Patient Details  Name: Micheal [name] MRN: [id number] Date of Birth: November 21, 1940   Medicare Important Message Given:  Yes    Marshell Garfinkel, RN 07/12/2016, 8:17 AM

## 2016-07-12 NOTE — Progress Notes (Signed)
MD Anselm Jungling and MD [facility] notified that vascular has not been by. Vascular paged 6 times, PA paged once. no call back thus far. Dialysis has called several time. Dialysis notified that vascular has not been by yet.

## 2016-07-12 NOTE — Plan of Care (Signed)
Problem: Bowel/Gastric: Goal: Will not experience complications related to bowel motility Outcome: Completed/Met Date Met: 07/12/16 BM passed x2

## 2016-07-12 NOTE — Progress Notes (Signed)
Patient had 7 beat run of vtach.Asympathetic patient resting between care. No acute distress noted. Dr. Estanislado Pandy notified new orders given.

## 2016-07-12 NOTE — Progress Notes (Signed)
Subjective:  Patient partially pulled his dialysis catheter out. Daughter reports that he is a bit more interactive today. Case discussed with vascular surgery. Hopefully catheter can be repositioned over wire.  Objective:  Vital signs in last 24 hours:  Temp:  [97.9 F (36.6 C)-99.2 F (37.3 C)] 99.2 F (37.3 C) (10/18 0815) Pulse Rate:  [74-79] 75 (10/18 0815) Resp:  [14-21] 20 (10/18 0815) BP: (102-137)/(46-98) 117/53 (10/18 0815) SpO2:  [97 %-100 %] 99 % (10/18 0815) Weight:  [116.4 kg (256 lb 9.9 oz)-116.5 kg (256 lb 13.4 oz)] 116.5 kg (256 lb 13.4 oz) (10/17 1950)  Weight change:  Filed Weights   07/07/16 1722 07/11/16 1805 07/11/16 1950  Weight: 111.5 kg (245 lb 13 oz) 116.4 kg (256 lb 9.9 oz) 116.5 kg (256 lb 13.4 oz)    Intake/Output:    Intake/Output Summary (Last 24 hours) at 07/12/16 0944 Last data filed at 07/12/16 0938  Gross per 24 hour  Intake           519.33 ml  Output             1400 ml  Net          -880.67 ml     Physical Exam: General: Chronically ill-appearing, laying in bed  HEENT Anicteric, moist mucous membranes  Neck supple  Pulm/lungs Normal breathing effort, decreased breath sounds at bases  CVS/Heart no rub or gallop, paced rhythm  Abdomen:  Soft, nontender, nondistended  Extremities: 1+ pitting edema bilaterally, tight edema  Neurologic: Alert, able to answer questions  Skin: No rashes          Basic Metabolic Panel:   Recent Labs Lab 07/08/16 0412 07/09/16 0420 07/10/16 0349 07/11/16 0354 07/11/16 1800 07/12/16 0408  NA 137 136 136 137  --  140  K 3.9 3.6 3.7 3.8  --  3.6  CL 98* 99* 100* 101  --  104  CO2 30 29 29 27   --  29  GLUCOSE 129* 144* 116* 113*  --  107*  BUN 96* 93* 99* 95*  --  68*  CREATININE 4.08* 3.86* 3.63* 3.37*  --  2.48*  CALCIUM 9.2 8.8* 8.8* 9.4  --  9.6  MG  --   --   --   --   --  2.9*  PHOS  --   --   --   --  3.4 2.8     CBC:  Recent Labs Lab 07/06/16 2042 07/07/16 0427  07/08/16 0412 07/10/16 0349 07/11/16 0354  WBC 17.7* 15.0* 13.4* 7.0 7.6  HGB 9.8* 9.8* 8.7* 8.6* 8.4*  HCT 29.6* 29.0* 26.5* 25.7* 24.6*  MCV 89.2 89.4 90.3 90.5 88.7  PLT 124* 128* 126* 124* 146*      Microbiology:  Recent Results (from the past 720 hour(s))  Urine culture     Status: Abnormal   Collection Time: 07/05/16  7:59 AM  Result Value Ref Range Status   Specimen Description URINE, RANDOM  Final   Special Requests NONE  Final   Culture >=100,000 COLONIES/mL MORGANELLA MORGANII (A)  Final   Report Status 07/08/2016 FINAL  Final   Organism ID, Bacteria MORGANELLA MORGANII (A)  Final      Susceptibility   Morganella morganii - MIC*    AMPICILLIN >=32 RESISTANT Resistant     CEFAZOLIN >=64 RESISTANT Resistant     CEFTRIAXONE <=1 SENSITIVE Sensitive     CIPROFLOXACIN <=0.25 SENSITIVE Sensitive     GENTAMICIN <=1 SENSITIVE Sensitive  IMIPENEM 4 SENSITIVE Sensitive     NITROFURANTOIN 64 RESISTANT Resistant     TRIMETH/SULFA <=20 SENSITIVE Sensitive     AMPICILLIN/SULBACTAM >=32 RESISTANT Resistant     PIP/TAZO <=4 SENSITIVE Sensitive     * >=100,000 COLONIES/mL MORGANELLA MORGANII  Surgical PCR screen     Status: Abnormal   Collection Time: 07/05/16  1:18 PM  Result Value Ref Range Status   MRSA, PCR POSITIVE (A) NEGATIVE Final    Comment: RESULT CALLED TO, READ BACK BY AND VERIFIED WITH: ERIN CARTER ON 07/05/16 AT 1553 [place]    Staphylococcus aureus POSITIVE (A) NEGATIVE Final    Comment:        The Xpert SA Assay (FDA approved for NASAL specimens in patients over 33 years of age), is one component of a comprehensive surveillance program.  Test performance has been validated by [facility] for patients greater than or equal to 1 year old. It is not intended to diagnose infection nor to guide or monitor treatment.     Coagulation Studies:  Recent Labs  07/10/16 0349 07/11/16 0354 07/12/16 0408  LABPROT 17.3* 16.9* 17.5*  INR 1.40 1.36 1.42     Urinalysis: No results for input(s): COLORURINE, LABSPEC, PHURINE, GLUCOSEU, HGBUR, BILIRUBINUR, KETONESUR, PROTEINUR, UROBILINOGEN, NITRITE, LEUKOCYTESUR in the last 72 hours.  Invalid input(s): APPERANCEUR    Imaging: No results found.   Medications:   . sodium chloride 40 mL/hr at 07/11/16 1330   . ALPRAZolam  0.5 mg Oral TID  . bisacodyl  10 mg Rectal Daily  . calcitRIOL  0.25 mcg Oral Daily  . calcium-vitamin D  1 tablet Oral BID  . Chlorhexidine Gluconate Cloth  6 each Topical Q0600  . citalopram  40 mg Oral Daily  . enoxaparin (LOVENOX) injection  1 mg/kg Subcutaneous Q24H  . febuxostat  40 mg Oral QHS  . ferrous sulfate  325 mg Oral BID WC  . mupirocin ointment  1 application Nasal BID  . polyethylene glycol  17 g Oral Daily  . pravastatin  80 mg Oral q1800  . senna-docusate  1 tablet Oral BID  . tamsulosin  0.4 mg Oral Daily  . warfarin  6 mg Oral ONCE-1800  . Warfarin - Pharmacist Dosing Inpatient   Does not apply q1800   sodium chloride, sodium chloride, acetaminophen **OR** acetaminophen, alteplase, alum & mag hydroxide-simeth, bisacodyl, cyclobenzaprine, guaiFENesin-dextromethorphan, heparin, HYDROcodone-acetaminophen, HYDROmorphone (DILAUDID) injection, ipratropium-albuterol, lidocaine (PF), lidocaine-prilocaine, magnesium citrate, magnesium hydroxide, menthol-cetylpyridinium **OR** phenol, metoCLOPramide **OR** metoCLOPramide (REGLAN) injection, ondansetron **OR** ondansetron (ZOFRAN) IV, pentafluoroprop-tetrafluoroeth  Assessment/ Plan:  75 y.o.caucasian male with hypertension, morbid obesity, gout, history of nonsteroidal use, chronic atrial fibrillation, hyperlipidemia,diabetes, depression, ICD, congestive heart failure with LVEF 25%, chronic kidney disease now presents with comminuted intertrochanteric fracture of the left femur  1.  Acute renal failure on chronic kidney disease stage IV Cause of ARF not entirely clear but likely secondary to volume  depletion/UTI.  - The patient has partially pulled out his dialysis catheter.  Hopefully this can be replaced over wire. We are attempting at time-limited trial of dialysis to see if this improves his mental status. Patient did not want long-term dialysis however.  2.  Anemia chronic kidney disease.  Hemoglobin 8.4 at last check. Continues to drift down slowly. If he needs continued dialysis we will consider starting the patient on Epogen.  3.  Lower extremity edema -  Diuretics remain on hold at the moment.    LOS: 7 Jekhi Bolin 10/18/20179:44 AM

## 2016-07-12 NOTE — Progress Notes (Signed)
Pre Dialysis 

## 2016-07-12 NOTE — Consult Note (Addendum)
ANTICOAGULATION CONSULT NOTE - Initial Consult  Pharmacy Consult for warfarin Indication: atrial fibrillation  No Known Allergies  Patient Measurements: Height: 5\' 10"  (177.8 cm) Weight: 256 lb 13.4 oz (116.5 kg) IBW/kg (Calculated) : 73 Heparin Dosing Weight:   Vital Signs: Temp: 98.7 F (37.1 C) (10/18 0412) Temp Source: Oral (10/18 0412) BP: 110/59 (10/18 0412) Pulse Rate: 74 (10/18 0412)  Labs:  Recent Labs  07/10/16 0349 07/11/16 0354 07/12/16 0408  HGB 8.6* 8.4*  --   HCT 25.7* 24.6*  --   PLT 124* 146*  --   LABPROT 17.3* 16.9* 17.5*  INR 1.40 1.36 1.42  CREATININE 3.63* 3.37*  --     Estimated Creatinine Clearance: 25 mL/min (by C-G formula based on SCr of 3.37 mg/dL (H)).   Medical History: Past Medical History:  Diagnosis Date  . Anxiety   . Atrial fibrillation ([place])   . 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 CAD (coronary artery disease)   . CHF (congestive heart failure) ([place])   . Chronic back pain   . Chronic systolic heart failure (HCC) ~2009   EF under 25%  . CKD (chronic kidney disease) stage 3, GFR 30-59 ml/min 2009   stage III  . Dyspnea    with exertion  . Fracture closed, humerus    never hadsurgery, left  . Gout, unspecified   . Hypertension   . Myocardial infarction    pt is unaware of this but doctors say he has  . Other left bundle branch block   . Thrombocytopenia (HCC)     Medications:  Scheduled:  . ALPRAZolam  0.5 mg Oral TID  . bisacodyl  10 mg Rectal Daily  . calcitRIOL  0.25 mcg Oral Daily  . calcium-vitamin D  1 tablet Oral BID  . citalopram  40 mg Oral Daily  . enoxaparin (LOVENOX) injection  1 mg/kg Subcutaneous Q24H  . febuxostat  40 mg Oral QHS  . ferrous sulfate  325 mg Oral BID WC  . polyethylene glycol  17 g Oral Daily  . pravastatin  80 mg Oral q1800  . senna-docusate  1 tablet Oral BID  . tamsulosin  0.4 mg Oral Daily  . [START ON 07/13/2016] warfarin  2.5 mg Oral Once  per day on Tue Thu Sat  . warfarin  5 mg Oral Once per day on Sun Mon Wed Fri  . warfarin  5 mg Oral ONCE-1800  . Warfarin - Pharmacist Dosing Inpatient   Does not apply q1800    Assessment: Pt is a 75 year old male w/ a PMH of Afib on warfarin at home. Pt is s/p left hip surgery. INR is subtherapeutic at 1.5. Pt received a total of 10mg  vit K (5mg  po, 5mg  Iv). Pharmacy consulted to restart warfarin. Patient on Lovenox bridge.  Pt home dose is 5mg  Sun,MWF and 2.5 T,T,sat. Pt last warfarin dose was 10/10  Date  INR  Warfarin Dose 10/11  2.01  0, vit K 5mg  PO 10/12  2.01  0, vit K 5mg  IV 10/13  1.79  none 10/14  1.50   5 mg 10/15  1.44  5 mg 10/16  1.40                 5 mg 10/17            1.36  Not given 10/18  1.42  Goal of Therapy:  INR 2-3  Monitor platelets by anticoagulation protocol:  Yes   Plan:  INR slow to increase as expected after Vitamin K. Patient was also not given warfarin last night bc they were in dialysis and it was not given upon return. Will order warfarin 6 mg today. Recheck INR in the AM. Continue lovenox brdige 1mg /kg q 12 hrs  Melissa D Maccia 07/12/2016,7:28 AM

## 2016-07-12 NOTE — Progress Notes (Signed)
Dialysis complete

## 2016-07-12 NOTE — Op Note (Signed)
OPERATIVE NOTE   PROCEDURE: 1. Exchanged temporary dialysis catheter over a wire same venous access  PRE-OPERATIVE DIAGNOSIS: Complication vascular dialysis device; end-stage renal disease requiring hemodialysis  POST-OPERATIVE DIAGNOSIS: Same  SURGEON: Roger Fasnacht, Dolores Lory  ESTIMATED BLOOD LOSS: Minimal cc  CONTRAST USED:  None  INDICATIONS:   [name] is a 75 year old male who presents with catheter dysfunction associated with a right femoral non-tunneled dialysis catheter.  Adequate dialysis has not been possible.  DESCRIPTION: After obtaining full informed written consent, the patient was positioned supine. The right groin was prepped and draped in a sterile fashion.  The existing catheter is then transected proximal to the skin insertion site.  The guidewire is advanced without difficulty.  The non-dialysis catheter is fed into the central venous system without difficulty.  All lumens aspirate and flush well.  Catheter secured to the skin of the right thigh with 2-0 nylon. A sterile dressing is applied with a Biopatch.  COMPLICATIONS: None  CONDITION: Micheal [name], [name] Office:  [phone]   10/15/2015,7:18 PM

## 2016-07-12 NOTE — Progress Notes (Signed)
MD Anselm Jungling notified that vascular has not been by. MD Anselm Jungling reaching out to MD [facility].

## 2016-07-12 NOTE — Progress Notes (Signed)
Post dialysis 

## 2016-07-12 NOTE — Progress Notes (Addendum)
CSW spoke to MD per MD patient is getting dialysis and the amount of treatments have not been determined. He also noted that he's unsure if it will be permanent dialysis. Stated that patient isn't looking to discharge within the next 2 days. CSW updated [facility] Admissions Coordinator- Seth Bake. Per Seth Bake if patient receives permanent dialysis then she cannot accept patient at [facility] due to the facility not being able to meet his needs.   Ernest Pine, MSW, LCSW, [place] Clinical Social Worker [phone]

## 2016-07-12 NOTE — Progress Notes (Signed)
Dialysis started 

## 2016-07-12 NOTE — Progress Notes (Signed)
Patient denies removing port. Port intact during my shift. Dressing removed and cathter exposed. No bleeding noted. Site clean. Sterile dressing lightly applied to cover dressing. Dr. Marcelle Overlie notified states that MD will come assess.

## 2016-07-12 NOTE — Progress Notes (Signed)
PT Cancellation Note  Patient Details Name: SHUBHAM KNIFFIN MRN: [id number] DOB: 09-30-1940   Cancelled Treatment:    Reason Eval/Treat Not Completed: Medical issues which prohibited therapy, Continue to hold due to temporary femoral catheter being partially pulled and not corrected at this time. Re attempt treatment tomorrow.    Larae Grooms, PTA 07/12/2016, 3:12 PM

## 2016-07-12 NOTE — Progress Notes (Signed)
   Subjective: 5 Days Post-Op Procedure(s) (LRB): INTRAMEDULLARY (IM) NAIL INTERTROCHANTRIC (Left) Patient reports pain as mild with rest, severe with movement  Patient is well, and has had no acute complaints or problems Denies any CP, SOB, ABD pain. We will continue therapy today.  Plan is to go Skilled nursing facility after hospital stay.  Objective: Vital signs in last 24 hours: Temp:  [97.9 F (36.6 C)-98.7 F (37.1 C)] 98.7 F (37.1 C) (10/18 0412) Pulse Rate:  [74-79] 74 (10/18 0412) Resp:  [14-21] 18 (10/18 0412) BP: (102-137)/(46-98) 110/59 (10/18 0412) SpO2:  [97 %-100 %] 99 % (10/18 0412) Weight:  [116.4 kg (256 lb 9.9 oz)-116.5 kg (256 lb 13.4 oz)] 116.5 kg (256 lb 13.4 oz) (10/17 1950)  Intake/Output from previous day: 10/17 0701 - 10/18 0700 In: 519.3 [P.O.:360; I.V.:59.3; IV Piggyback:100] Out: 1275 [Urine:1275] Intake/Output this shift: No intake/output data recorded.   Recent Labs  07/10/16 0349 07/11/16 0354  HGB 8.6* 8.4*    Recent Labs  07/10/16 0349 07/11/16 0354  WBC 7.0 7.6  RBC 2.84* 2.77*  HCT 25.7* 24.6*  PLT 124* 146*    Recent Labs  07/11/16 0354 07/12/16 0408  NA 137 140  K 3.8 3.6  CL 101 104  CO2 27 29  BUN 95* 68*  CREATININE 3.37* 2.48*  GLUCOSE 113* 107*  CALCIUM 9.4 9.6    Recent Labs  07/11/16 0354 07/12/16 0408  INR 1.36 1.42    EXAM General - Patient is Alert, Appropriate and Oriented Extremity - Neurovascular intact Sensation intact distally Intact pulses distally Dorsiflexion/Plantar flexion intact No cellulitis present Compartment soft no edema Dressing - dressing C/D/I, scant drainage and serous drainage, dressing changed. No erythema. Motor Function - intact, moving foot and toes well on exam.   Past Medical History:  Diagnosis Date  . Anxiety   . Atrial fibrillation ([place])   . 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 CAD (coronary artery disease)   . CHF  (congestive heart failure) ([place])   . Chronic back pain   . Chronic systolic heart failure (HCC) ~2009   EF under 25%  . CKD (chronic kidney disease) stage 3, GFR 30-59 ml/min 2009   stage III  . Dyspnea    with exertion  . Fracture closed, humerus    never hadsurgery, left  . Gout, unspecified   . Hypertension   . Myocardial infarction    pt is unaware of this but doctors say he has  . Other left bundle branch block   . Thrombocytopenia (HCC)     Assessment/Plan:   5 Days Post-Op Procedure(s) (LRB): INTRAMEDULLARY (IM) NAIL INTERTROCHANTRIC (Left) Active Problems:   Hip fracture (HCC)  Estimated body mass index is 36.85 kg/m as calculated from the following:   Height as of this encounter: 5\' 10"  (1.778 m).   Weight as of this encounter: 116.5 kg (256 lb 13.4 oz). Advance diet Up with therapy  Incision sites appear well with mild serous drainage, no sign of infections Acute post op blood loss anemia - Hgb 8.4 Discharge to SNF pending BM and medical clearance Remove staples and apply steri strips on 07/21/16 Follow up with [facility] in 6 weeks   DVT Prophylaxis - Lovenox, Foot Pumps and TED hose Weight-Bearing as tolerated to left leg   T. Rachelle Hora, PA-C [place] 07/12/2016, 8:19 AM

## 2016-07-13 ENCOUNTER — Inpatient Hospital Stay: Payer: Medicare Other

## 2016-07-13 ENCOUNTER — Telehealth: Payer: Self-pay | Admitting: Internal Medicine

## 2016-07-13 ENCOUNTER — Encounter: Payer: Self-pay | Admitting: Internal Medicine

## 2016-07-13 LAB — PROTIME-INR
INR: 1.31
PROTHROMBIN TIME: 16.4 s — AB (ref 11.4–15.2)

## 2016-07-13 LAB — PHOSPHORUS: PHOSPHORUS: 1.9 mg/dL — AB (ref 2.5–4.6)

## 2016-07-13 MED ORDER — WARFARIN SODIUM 5 MG PO TABS
6.0000 mg | ORAL_TABLET | Freq: Once | ORAL | Status: AC
  Administered 2016-07-13: 6 mg via ORAL
  Filled 2016-07-13: qty 1

## 2016-07-13 NOTE — Consult Note (Signed)
ANTICOAGULATION CONSULT NOTE - Initial Consult  Pharmacy Consult for warfarin Indication: atrial fibrillation  No Known Allergies  Patient Measurements: Height: 5\' 10"  (177.8 cm) Weight: 258 lb 9.6 oz (117.3 kg) IBW/kg (Calculated) : 73 Heparin Dosing Weight:   Vital Signs: Temp: 98.6 F (37 C) (10/19 0519) Temp Source: Oral (10/19 0519) BP: 123/64 (10/19 0519) Pulse Rate: 75 (10/19 0519)  Labs:  Recent Labs  07/11/16 0354 07/12/16 0408 07/13/16 0609  HGB 8.4*  --   --   HCT 24.6*  --   --   PLT 146*  --   --   LABPROT 16.9* 17.5* 16.4*  INR 1.36 1.42 1.31  CREATININE 3.37* 2.48*  --     Estimated Creatinine Clearance: 34 mL/min (by C-G formula based on SCr of 2.48 mg/dL (H)).   Medical History: Past Medical History:  Diagnosis Date  . Anxiety   . Atrial fibrillation ([place])   . 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 CAD (coronary artery disease)   . CHF (congestive heart failure) ([place])   . Chronic back pain   . Chronic systolic heart failure (HCC) ~2009   EF under 25%  . CKD (chronic kidney disease) stage 3, GFR 30-59 ml/min 2009   stage III  . Dyspnea    with exertion  . Fracture closed, humerus    never hadsurgery, left  . Gout, unspecified   . Hypertension   . Myocardial infarction    pt is unaware of this but doctors say he has  . Other left bundle branch block   . Thrombocytopenia (HCC)     Medications:  Scheduled:  . ALPRAZolam  0.5 mg Oral TID  . bisacodyl  10 mg Rectal Daily  . calcitRIOL  0.25 mcg Oral Daily  . calcium-vitamin D  1 tablet Oral BID  . Chlorhexidine Gluconate Cloth  6 each Topical Q0600  . citalopram  40 mg Oral Daily  . enoxaparin (LOVENOX) injection  1 mg/kg Subcutaneous Q24H  . febuxostat  40 mg Oral QHS  . ferrous sulfate  325 mg Oral BID WC  . mupirocin ointment  1 application Nasal BID  . polyethylene glycol  17 g Oral Daily  . pravastatin  80 mg Oral q1800  . senna-docusate  1  tablet Oral BID  . tamsulosin  0.4 mg Oral Daily  . warfarin  6 mg Oral ONCE-1800  . Warfarin - Pharmacist Dosing Inpatient   Does not apply q1800    Assessment: Pt is a 75 year old male w/ a PMH of Afib on warfarin at home. Pt is s/p left hip surgery. INR is subtherapeutic at 1.5. Pt received a total of 10mg  vit K (5mg  po, 5mg  Iv). Pharmacy consulted to restart warfarin. Patient on Lovenox bridge.  Pt home dose is 5mg  Sun,MWF and 2.5 T,T,sat. Pt last warfarin dose was 10/10  Date  INR  Warfarin Dose 10/11  2.01  0, vit K 5mg  PO 10/12  2.01  0, vit K 5mg  IV 10/13  1.79  none 10/14  1.50   5 mg 10/15  1.44  5 mg 10/16  1.40                 5 mg 10/17            1.36  Not given 10/18  1.42  6mg  10/19  1.31   Goal of Therapy:  INR 2-3  Monitor platelets by anticoagulation protocol: Yes  Plan:  INR slow to increase, effects of vit K are likely dimished now. Patient however missed dose the evening of 10/17. Will order warfarin 6 mg again tonight. Recheck INR in the AM. Continue lovenox brdige 1mg /kg q 12 hrs  Melissa D Maccia 07/13/2016,7:30 AM

## 2016-07-13 NOTE — Progress Notes (Signed)
Physical Therapy Treatment Patient Details Name: Micheal [name] MRN: [id number] DOB: [birth year] Today's Date: 07/13/2016    History of Present Illness Micheal [name] is a 75yo white male who comes [facility] after sustaining a fall at home with Left hip fracture. Pt was held for a few days until medically stable/cleared for surgery by cardiology, nephrology, adn underwent Left hip ORIF/IM nailing on 10/13. Baseline level of funcitonal includes household AMB without AD, min-monA for bathing, and total assist for IADL. Pt does not tolerate long AMB distances. PMH: afib, PPM, CHF, EF: 25%, CKD-4. on POD he transferd from CCU to 1A.     PT Comments    Pt limited to bed exercises only on this date due to temp femoral dialysis catheter per Dr. Holley Raring. Pt able to complete all exercises as instructed with minimal increase in L hip pain. Daughter advised why pt is currently limited to bed level exercises until temp fem cath can be removed. Pt will benefit from skilled PT services to address deficits in strength, balance, and mobility in order to return to full function at home.   Follow Up Recommendations  SNF     Equipment Recommendations       Recommendations for Other Services       Precautions / Restrictions Precautions Precautions: Fall Restrictions Weight Bearing Restrictions: Yes LLE Weight Bearing: Weight bearing as tolerated Other Position/Activity Restrictions: Remote Left UE fracture, non-surgical, with chronic functional hypofunciton.     Mobility  Bed Mobility               General bed mobility comments: Deferred per nephrologist  Transfers                 General transfer comment: Deferred per nephrologist  Ambulation/Gait             General Gait Details: Deferred per nephrologist   Stairs            Wheelchair Mobility    Modified Rankin (Stroke Patients Only)       Balance                                    Cognition  Arousal/Alertness: Lethargic Behavior During Therapy: WFL for tasks assessed/performed;Flat affect                        Exercises General Exercises - Lower Extremity Ankle Circles/Pumps: AROM;Both;15 reps;Supine Quad Sets: Strengthening;Both;15 reps;Supine Gluteal Sets: Strengthening;Both;Supine;15 reps Short Arc Quad: Strengthening;Left;15 reps;Supine Heel Slides: Strengthening;Left;Supine;15 reps Hip ABduction/ADduction: Strengthening;Left;15 reps;Supine Straight Leg Raises: Strengthening;Left;15 reps;Supine    General Comments        Pertinent Vitals/Pain Pain Assessment: 0-10 Pain Score: 2  Pain Location: L hip/thing Pain Descriptors / Indicators: Operative site guarding Pain Intervention(s): Monitored during session    Home Living                      Prior Function            PT Goals (current goals can now be found in the care plan section) Acute Rehab PT Goals Patient Stated Goal: To return home to PLOF. PT Goal Formulation: With patient Time For Goal Achievement: 07/22/16 Potential to Achieve Goals: Fair Progress towards PT goals: Progressing toward goals    Frequency    BID      PT Plan Current plan remains  appropriate    Co-evaluation             End of Session Equipment Utilized During Treatment: Oxygen Activity Tolerance: Patient tolerated treatment well Patient left: with family/visitor present;in chair;with call bell/phone within reach;with chair alarm set     Time: [id number] PT Time Calculation (min) (ACUTE ONLY): 18 min  Charges:  $Therapeutic Exercise: 8-22 mins                    G Codes:      Lyndel Safe Huprich PT, DPT   Huprich,Jason 07/13/2016, 3:54 PM

## 2016-07-13 NOTE — Progress Notes (Signed)
PT Cancellation Note  Patient Details Name: Micheal [name] MRN: [id number] DOB: May 24, 1941   Cancelled Treatment:    Reason Eval/Treat Not Completed: Patient at procedure or test/unavailable. Pt at dialysis and unavailable for therapy. Will attempt PT this afternoon following dialysis.   Lyndel Safe Calene Paradiso PT, DPT   Scheryl Sanborn 07/13/2016, 11:04 AM

## 2016-07-13 NOTE — Progress Notes (Signed)
HD started. 

## 2016-07-13 NOTE — Progress Notes (Signed)
Hemodialysis tx completed. 

## 2016-07-13 NOTE — Telephone Encounter (Signed)
Debbie at [facility] called with patient's INR results.  Patient's INR was 1.3.

## 2016-07-13 NOTE — Progress Notes (Signed)
Post hd tx 

## 2016-07-13 NOTE — Telephone Encounter (Signed)
He is in the hospital I don't know how they would be getting a INR on him from there No action needed--since he is in the hospital

## 2016-07-13 NOTE — Progress Notes (Signed)
Patient is A&O x4, but forgetful. Incont of urine and stool this shift. Tele in place, (pacer) Dialysis today, patient tolerated well. Cath to left groin intact. Appetite poor. Dressing to left hip was changed. On 2L O2 at night. Bed/chair alarm on for safety. Fluids continued. Gave oral pain med x1, with good relief.

## 2016-07-13 NOTE — Progress Notes (Signed)
Uncooperative overnight; catheter intact, w/o complications at this time; bath given; trapeze applied to [facility]; Barbaraann Faster, RN 6:57 AM 07/13/2016

## 2016-07-13 NOTE — Discharge Instructions (Addendum)
Diet: As you were doing prior to hospitalization   Shower:  May shower but keep the wounds dry, use an occlusive plastic wrap, NO SOAKING IN TUB.  If the bandage gets wet, change with a clean dry gauze.  Dressing:  You may change your dressing as needed. Change the dressing with sterile gauze dressing.    Activity:  Increase activity slowly as tolerated, but follow the weight bearing instructions below.  No lifting or driving for 6 weeks.  Weight Bearing:   Weight bearing as tolerated to left lower extremity  To prevent constipation: you may use a stool softener such as -  Colace (over the counter) 100 mg by mouth twice a day  Drink plenty of fluids (prune juice may be helpful) and high fiber foods Miralax (over the counter) for constipation as needed.    Itching:  If you experience itching with your medications, try taking only a single pain pill, or even half a pain pill at a time.  You may take up to 10 pain pills per day, and you can also use benadryl over the counter for itching or also to help with sleep.   Precautions:  If you experience chest pain or shortness of breath - call 911 immediately for transfer to the hospital emergency department!!  If you develop a fever greater that 101 F, purulent drainage from wound, increased redness or drainage from wound, or calf pain-Call [facility]                                              Follow- Up Appointment:  Please call for an appointment to be seen in 6 weeks at [facility] on my medicine - Coumadin   (Warfarin)  Why was Coumadin prescribed for you? Coumadin was prescribed for you because you have a blood clot or a medical condition that can cause an increased risk of forming blood clots. Blood clots can cause serious health problems by blocking the flow of blood to the heart, lung, or brain. Coumadin can prevent harmful blood clots from forming. As a reminder your indication for Coumadin is:   Stroke  Prevention Because Of Atrial Fibrillation  What test will check on my response to Coumadin? While on Coumadin (warfarin) you will need to have an INR test regularly to ensure that your dose is keeping you in the desired range. The INR (international normalized ratio) number is calculated from the result of the laboratory test called prothrombin time (PT).  If an INR APPOINTMENT HAS NOT ALREADY BEEN MADE FOR YOU please schedule an appointment to have this lab work done by your health care provider within 7 days. Your INR goal is usually a number between:  2 to 3 or your provider may give you a more narrow range like 2-2.5.  Ask your health care provider during an office visit what your goal INR is.  What  do you need to  know  About  COUMADIN? Take Coumadin (warfarin) exactly as prescribed by your healthcare provider about the same time each day.  DO NOT stop taking without talking to the doctor who prescribed the medication.  Stopping without other blood clot prevention medication to take the place of Coumadin may increase your risk of developing a new clot or stroke.  Get refills before you run out.  What do you do if  you miss a dose? If you miss a dose, take it as soon as you remember on the same day then continue your regularly scheduled regimen the next day.  Do not take two doses of Coumadin at the same time.  Important Safety Information A possible side effect of Coumadin (Warfarin) is an increased risk of bleeding. You should call your healthcare provider right away if you experience any of the following: ? Bleeding from an injury or your nose that does not stop. ? Unusual colored urine (red or dark brown) or unusual colored stools (red or black). ? Unusual bruising for unknown reasons. ? A serious fall or if you hit your head (even if there is no bleeding).  Some foods or medicines interact with Coumadin (warfarin) and might alter your response to warfarin. To help avoid this: ? Eat a  balanced diet, maintaining a consistent amount of Vitamin K. ? Notify your provider about major diet changes you plan to make. ? Avoid alcohol or limit your intake to 1 drink for women and 2 drinks for men per day. (1 drink is 5 oz. wine, 12 oz. beer, or 1.5 oz. liquor.)  Make sure that ANY health care provider who prescribes medication for you knows that you are taking Coumadin (warfarin).  Also make sure the healthcare provider who is monitoring your Coumadin knows when you have started a new medication including herbals and non-prescription products.  Coumadin (Warfarin)  Major Drug Interactions  Increased Warfarin Effect Decreased Warfarin Effect  Alcohol (large quantities) Antibiotics (esp. Septra/Bactrim, Flagyl, Cipro) Amiodarone (Cordarone) Aspirin (ASA) Cimetidine (Tagamet) Megestrol (Megace) NSAIDs (ibuprofen, naproxen, etc.) Piroxicam (Feldene) Propafenone (Rythmol SR) Propranolol (Inderal) Isoniazid (INH) Posaconazole (Noxafil) Barbiturates (Phenobarbital) Carbamazepine (Tegretol) Chlordiazepoxide (Librium) Cholestyramine (Questran) Griseofulvin Oral Contraceptives Rifampin Sucralfate (Carafate) Vitamin K   Coumadin (Warfarin) Major Herbal Interactions  Increased Warfarin Effect Decreased Warfarin Effect  Garlic Ginseng Ginkgo biloba Coenzyme Q10 Green tea St. Johns wort    Coumadin (Warfarin) FOOD Interactions  Eat a consistent number of servings per week of foods HIGH in Vitamin K (1 serving =  cup)  Collards (cooked, or boiled & drained) Kale (cooked, or boiled & drained) Mustard greens (cooked, or boiled & drained) Parsley *serving size only =  cup Spinach (cooked, or boiled & drained) Swiss chard (cooked, or boiled & drained) Turnip greens (cooked, or boiled & drained)  Eat a consistent number of servings per week of foods MEDIUM-HIGH in Vitamin K (1 serving = 1 cup)  Asparagus (cooked, or boiled & drained) Broccoli (cooked, boiled & drained,  or raw & chopped) Brussel sprouts (cooked, or boiled & drained) *serving size only =  cup Lettuce, raw (green leaf, endive, romaine) Spinach, raw Turnip greens, raw & chopped   These websites have more information on Coumadin (warfarin):  FailFactory.se; VeganReport.com.au;  Heart healthy diet. Discontinue Lovenox if INR is therapeutic.

## 2016-07-13 NOTE — Progress Notes (Signed)
Subjective:  Patient seen and evaluated during third dialysis treatment. He appears to be tolerating this well. Slightly more awake today.  Objective:  Vital signs in last 24 hours:  Temp:  [98.3 F (36.8 C)-98.8 F (37.1 C)] 98.8 F (37.1 C) (10/19 0925) Pulse Rate:  [57-82] 76 (10/19 1100) Resp:  [13-26] 17 (10/19 1100) BP: (104-137)/(52-85) 109/72 (10/19 1100) SpO2:  [88 %-100 %] 100 % (10/19 1100) Weight:  [117.3 kg (258 lb 9.6 oz)] 117.3 kg (258 lb 9.6 oz) (10/18 2201)  Weight change: 0.9 kg (1 lb 15.7 oz) Filed Weights   07/11/16 1950 07/12/16 1925 07/12/16 2201  Weight: 116.5 kg (256 lb 13.4 oz) 117.3 kg (258 lb 9.6 oz) 117.3 kg (258 lb 9.6 oz)    Intake/Output:    Intake/Output Summary (Last 24 hours) at 07/13/16 1129 Last data filed at 07/13/16 1039  Gross per 24 hour  Intake              460 ml  Output             1200 ml  Net             -740 ml     Physical Exam: General: Chronically ill-appearing, laying in bed  HEENT Anicteric, moist mucous membranes  Neck supple  Pulm/lungs Normal breathing effort, decreased breath sounds at bases  CVS/Heart no rub or gallop, paced rhythm  Abdomen:  Soft, nontender, nondistended  Extremities: 1+ pitting edema bilaterally, tight edema  Neurologic: Awake, alert, following commands  Skin: No rashes  Access: Right femoral dialysis catheter       Basic Metabolic Panel:   Recent Labs Lab 07/08/16 0412 07/09/16 0420 07/10/16 0349 07/11/16 0354 07/11/16 1800 07/12/16 0408 07/13/16 1014  NA 137 136 136 137  --  140  --   K 3.9 3.6 3.7 3.8  --  3.6  --   CL 98* 99* 100* 101  --  104  --   CO2 30 29 29 27   --  29  --   GLUCOSE 129* 144* 116* 113*  --  107*  --   BUN 96* 93* 99* 95*  --  68*  --   CREATININE 4.08* 3.86* 3.63* 3.37*  --  2.48*  --   CALCIUM 9.2 8.8* 8.8* 9.4  --  9.6  --   MG  --   --   --   --   --  2.9*  --   PHOS  --   --   --   --  3.4 2.8 1.9*     CBC:  Recent Labs Lab  07/06/16 2042 07/07/16 0427 07/08/16 0412 07/10/16 0349 07/11/16 0354  WBC 17.7* 15.0* 13.4* 7.0 7.6  HGB 9.8* 9.8* 8.7* 8.6* 8.4*  HCT 29.6* 29.0* 26.5* 25.7* 24.6*  MCV 89.2 89.4 90.3 90.5 88.7  PLT 124* 128* 126* 124* 146*      Microbiology:  Recent Results (from the past 720 hour(s))  Urine culture     Status: Abnormal   Collection Time: 07/05/16  7:59 AM  Result Value Ref Range Status   Specimen Description URINE, RANDOM  Final   Special Requests NONE  Final   Culture >=100,000 COLONIES/mL MORGANELLA MORGANII (A)  Final   Report Status 07/08/2016 FINAL  Final   Organism ID, Bacteria MORGANELLA MORGANII (A)  Final      Susceptibility   Morganella morganii - MIC*    AMPICILLIN >=32 RESISTANT Resistant  CEFAZOLIN >=64 RESISTANT Resistant     CEFTRIAXONE <=1 SENSITIVE Sensitive     CIPROFLOXACIN <=0.25 SENSITIVE Sensitive     GENTAMICIN <=1 SENSITIVE Sensitive     IMIPENEM 4 SENSITIVE Sensitive     NITROFURANTOIN 64 RESISTANT Resistant     TRIMETH/SULFA <=20 SENSITIVE Sensitive     AMPICILLIN/SULBACTAM >=32 RESISTANT Resistant     PIP/TAZO <=4 SENSITIVE Sensitive     * >=100,000 COLONIES/mL MORGANELLA MORGANII  Surgical PCR screen     Status: Abnormal   Collection Time: 07/05/16  1:18 PM  Result Value Ref Range Status   MRSA, PCR POSITIVE (A) NEGATIVE Final    Comment: RESULT CALLED TO, READ BACK BY AND VERIFIED WITH: ERIN CARTER ON 07/05/16 AT 1553 [place]    Staphylococcus aureus POSITIVE (A) NEGATIVE Final    Comment:        The Xpert SA Assay (FDA approved for NASAL specimens in patients over 19 years of age), is one component of a comprehensive surveillance program.  Test performance has been validated by [facility] for patients greater than or equal to 43 year old. It is not intended to diagnose infection nor to guide or monitor treatment.     Coagulation Studies:  Recent Labs  07/11/16 0354 07/12/16 0408 07/13/16 0609  LABPROT 16.9* 17.5*  16.4*  INR 1.36 1.42 1.31    Urinalysis: No results for input(s): COLORURINE, LABSPEC, PHURINE, GLUCOSEU, HGBUR, BILIRUBINUR, KETONESUR, PROTEINUR, UROBILINOGEN, NITRITE, LEUKOCYTESUR in the last 72 hours.  Invalid input(s): APPERANCEUR    Imaging: No results found.   Medications:   . sodium chloride 40 mL/hr at 07/12/16 1702   . ALPRAZolam  0.5 mg Oral TID  . bisacodyl  10 mg Rectal Daily  . calcitRIOL  0.25 mcg Oral Daily  . calcium-vitamin D  1 tablet Oral BID  . Chlorhexidine Gluconate Cloth  6 each Topical Q0600  . citalopram  40 mg Oral Daily  . enoxaparin (LOVENOX) injection  1 mg/kg Subcutaneous Q24H  . febuxostat  40 mg Oral QHS  . ferrous sulfate  325 mg Oral BID WC  . mupirocin ointment  1 application Nasal BID  . polyethylene glycol  17 g Oral Daily  . pravastatin  80 mg Oral q1800  . senna-docusate  1 tablet Oral BID  . tamsulosin  0.4 mg Oral Daily  . warfarin  6 mg Oral ONCE-1800  . Warfarin - Pharmacist Dosing Inpatient   Does not apply q1800   sodium chloride, sodium chloride, acetaminophen **OR** acetaminophen, alteplase, alum & mag hydroxide-simeth, cyclobenzaprine, guaiFENesin-dextromethorphan, heparin, HYDROcodone-acetaminophen, HYDROmorphone (DILAUDID) injection, ipratropium-albuterol, lidocaine (PF), lidocaine-prilocaine, magnesium citrate, magnesium hydroxide, menthol-cetylpyridinium **OR** phenol, metoCLOPramide **OR** metoCLOPramide (REGLAN) injection, ondansetron **OR** ondansetron (ZOFRAN) IV, pentafluoroprop-tetrafluoroeth  Assessment/ Plan:  75 y.o.caucasian male with hypertension, morbid obesity, gout, history of nonsteroidal use, chronic atrial fibrillation, hyperlipidemia,diabetes, depression, ICD, congestive heart failure with LVEF 25%, chronic kidney disease now presents with comminuted intertrochanteric fracture of the left femur  1.  Acute renal failure on chronic kidney disease stage IV Cause of ARF not entirely clear but likely secondary  to volume depletion/UTI.  - The patient's dialysis catheter was replaced yesterday. He is seen and evaluated during hemodialysis today. He appears to be tolerating. Patient did not want long-term dialysis. We were treating the acute component of renal failure. Recommend reassessing his mental status and ability to participate in care tomorrow.  2.  Anemia chronic kidney disease.  Hemoglobin 8.4. Recommend continued monitoring. If the patient recovers from hip  fracture we could potentially consider Epogen.  3.  Lower extremity edema -  Appears to be stable at the moment. Patient off diuretic therapy.    LOS: 8 Ebbie Cherry 10/19/201711:29 AM

## 2016-07-13 NOTE — Progress Notes (Signed)
[facility] [place] NAME: Micheal [name]    MR#:  [id number]  DATE OF BIRTH:  [birth year]  SUBJECTIVE:  CHIEF COMPLAINT:   Chief Complaint  Patient presents with  . Fall  . Leg Pain     Came with a fall and hip fracture.   Also have A fib on coumadin , and CKD.    had hypotension after surgery, now stable.    calm and alert this morning, more oriented. Tolerating diet .   Nephro decided to start hemodialysis, today is day #3.  REVIEW OF SYSTEMS:   Constitutional: Positive for malaise/fatigue. Negative for chills, fever and weight loss.  HENT: Negative for ear discharge, ear pain, hearing loss and nosebleeds.   Eyes: Negative for blurred vision, double vision and photophobia.  Respiratory: Negative for cough, hemoptysis, shortness of breath and wheezing.   Cardiovascular: Positive for leg swelling. Negative for chest pain, palpitations and orthopnea.  Gastrointestinal: Negative for abdominal pain, constipation, diarrhea, heartburn, melena, nausea and vomiting.  Genitourinary: Negative for dysuria and urgency.  Musculoskeletal: Positive for falls, joint pain and myalgias. Negative for back pain and neck pain.  Skin: Negative for rash.  Neurological: Negative for dizziness, tingling, sensory change, speech change, focal weakness and headaches.  Endo/Heme/Allergies: Does not bruise/bleed easily.  Psychiatric/Behavioral: Negative for depression.   ROS  DRUG ALLERGIES:  No Known Allergies  VITALS:  Blood pressure 117/69, pulse 76, temperature 97.9 F (36.6 C), temperature source Oral, resp. rate 16, height 5\' 10"  (1.778 m), weight 116.4 kg (256 lb 9.9 oz), SpO2 100 %.  PHYSICAL EXAMINATION:   GENERAL:  75 y.o.-year-old patient lying in the bed with no acute distress. Dissheleved appearing. EYES: Pupils equal, round, reactive to light and accommodation. No scleral icterus. Extraocular muscles intact.  HEENT: Head atraumatic,  normocephalic. Oropharynx and nasopharynx clear.  NECK:  Supple, no jugular venous distention. No thyroid enlargement, no tenderness.  LUNGS: Normal breath sounds bilaterally, no wheezing, rales,rhonchi or crepitation. No use of accessory muscles of respiration. Decreased bibasilar breath sounds. Some coarse coughing during my exam. CARDIOVASCULAR: S1, S2 normal. No  rubs, or gallops. 3/6 systolic murmur in place. ABDOMEN: Soft, nontender, nondistended. Bowel sounds present. No organomegaly or mass.  EXTREMITIES: Left thigh have dressing. Intact neurovascular bundle. No cyanosis, or clubbing. 2+ lower extremity edema with chronic pigmentation changes. Right groin hemodialysis catheter present. NEUROLOGIC: Cranial nerves II through XII are intact. Muscle strength 4/5 in all extremities. Sensation intact. Gait not checked.  PSYCHIATRIC: The patient is alert and oriented X2.  SKIN: No obvious rash, lesion, or ulcer.   Physical Exam LABORATORY PANEL:   CBC  Recent Labs Lab 07/11/16 0354  WBC 7.6  HGB 8.4*  HCT 24.6*  PLT 146*   ------------------------------------------------------------------------------------------------------------------  Chemistries   Recent Labs Lab 07/12/16 0408  NA 140  K 3.6  CL 104  CO2 29  GLUCOSE 107*  BUN 68*  CREATININE 2.48*  CALCIUM 9.6  MG 2.9*   ------------------------------------------------------------------------------------------------------------------  Cardiac Enzymes No results for input(s): TROPONINI in the last 168 hours. ------------------------------------------------------------------------------------------------------------------  RADIOLOGY:  Dg Chest 1 View  Result Date: 07/13/2016 CLINICAL DATA:  Hypoxia. History of atrial fibrillation, CAD, CHF, hypertension, MI, ICD. History of smoking. EXAM: CHEST 1 VIEW COMPARISON:  07/07/2016 FINDINGS: Heart is enlarged. patient's left-sided AICD with leads to the right atrium, right  ventricle, and coronary sinus. There is perihilar pulmonary edema which is increased over prior study. No focal consolidations or  definite pleural effusions. IMPRESSION: Cardiomegaly.  Increased perihilar edema. Electronically Signed   By: Nolon Nations M.D.   On: 07/13/2016 15:13    ASSESSMENT AND PLAN:   Active Problems:   Hip fracture (HCC)  #1 Left Hip fracture- secondary to fall. Orthopedics consulted. - Patient was announced moderate to high risk for surgery - Cardiology consult requested for cardiac clearance. INR is elevated, so held Coumadin. - One dose of oral vitamin K , given one dose [place] vit K .  - Explained risks to family and also discussed with orthopedic surgeon - Pain medication. 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 On Diuril takes at home. - Hold Lasix and metolazone. Nephrology consulted. - Gentle hydration today in follow-up. Renally adjust all medications. - Checked SPEP and UPEP and PTH as calcium also elevated. - appreciated nephrology help. worsening renal func. - much higher BUN than baseline.  Nephrology suggests to monitor in hospital and started hemodialysis on 07/11/16.   nephro continued HD, now may decide further need.  #3 Ac on Chronic systolic CHF- well compensated, EF is 25%   Chronic lower extremity edema. Held Lasix and metolazone   No lasix for now as renal func worsening, and question of how much he will response.  Was  On Gentle IV hydration to help renal func.   IV fluids stopped, stable now.   BP is stable now to have good renal perfusion.   nephro started on HD.  #4 Afib- rate controlled, on coreg-  - hold warfarin, s/p pacemaker  Hold COREG due to borderline BP.  Now on therapeutic lovenox.  re-started coumadin. Pharmacy to help dosing.  #5 Gout-continue home medications  #6 BPH- on  flomax  #7 DVT Prophylaxis- now INR at 2, start after the surgery  #8 meningioma   incindental finding, discussed with his daughter in room.  # 9 hematuria   and UTI   Ur cx- morgenella, sensitive to Rocephin ( started 07/06/16- stop on 07/12/16.)   Monitor CBC. Hb stable.  # Acute post op anemia due to blood loss   Oral iron.  # acute metabolic encephalopathy due to uremia   HD started by nephrology.  All the records are reviewed and case discussed with Care Management/Social Workerr. Management plans discussed with the patient, family and they are in agreement.  CODE STATUS: Full  TOTAL TIME TAKING CARE OF THIS PATIENT: 35 minutes.   POSSIBLE D/C IN 1-2 DAYS, DEPENDING ON CLINICAL CONDITION. Discussed with daughter in room and nephrologist.  Vaughan Basta M.D on 07/13/2016   Between 7am to 6pm - Pager - [phone]  After 6pm go to [email or web address] - password [email or web address] [facility]  Office  [phone]  CC: Primary care physician; [name], MD  Note: This dictation was prepared with Dragon dictation along with smaller phrase technology. Any transcriptional errors that result from this process are unintentional.

## 2016-07-13 NOTE — Progress Notes (Signed)
   Subjective: 6 Days Post-Op Procedure(s) (LRB): INTRAMEDULLARY (IM) NAIL INTERTROCHANTRIC (Left) Patient reports pain as mild with rest, severe with movement  Patient is well, and has had no acute complaints or problems Denies any CP, SOB, ABD pain. We will continue therapy today.  Plan is to go Skilled nursing facility after hospital stay.  Objective: Vital signs in last 24 hours: Temp:  [98.3 F (36.8 C)-99.2 F (37.3 C)] 98.6 F (37 C) (10/19 0519) Pulse Rate:  [75-82] 75 (10/19 0519) Resp:  [13-26] 18 (10/19 0519) BP: (109-137)/(52-85) 123/64 (10/19 0519) SpO2:  [88 %-100 %] 88 % (10/19 0519) Weight:  [117.3 kg (258 lb 9.6 oz)] 117.3 kg (258 lb 9.6 oz) (10/18 2201)  Intake/Output from previous day: 10/18 0701 - 10/19 0700 In: 460 [I.V.:460] Out: 1200 [Urine:1200] Intake/Output this shift: No intake/output data recorded.   Recent Labs  07/11/16 0354  HGB 8.4*    Recent Labs  07/11/16 0354  WBC 7.6  RBC 2.77*  HCT 24.6*  PLT 146*    Recent Labs  07/11/16 0354 07/12/16 0408  NA 137 140  K 3.8 3.6  CL 101 104  CO2 27 29  BUN 95* 68*  CREATININE 3.37* 2.48*  GLUCOSE 113* 107*  CALCIUM 9.4 9.6    Recent Labs  07/12/16 0408 07/13/16 0609  INR 1.42 1.31    EXAM General - Patient is Alert, Appropriate and Oriented Extremity - Neurovascular intact Sensation intact distally Intact pulses distally Dorsiflexion/Plantar flexion intact No cellulitis present Compartment soft no edema  Proximal thigh soft Dressing - dressing C/D/I and scant drainage. No erythema.  Motor Function - intact, moving foot and toes well on exam.   Past Medical History:  Diagnosis Date  . Anxiety   . Atrial fibrillation ([place])   . 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 CAD (coronary artery disease)   . CHF (congestive heart failure) ([place])   . Chronic back pain   . Chronic systolic heart failure (HCC) ~2009   EF under 25%  . CKD  (chronic kidney disease) stage 3, GFR 30-59 ml/min 2009   stage III  . Dyspnea    with exertion  . Fracture closed, humerus    never hadsurgery, left  . Gout, unspecified   . Hypertension   . Myocardial infarction    pt is unaware of this but doctors say he has  . Other left bundle branch block   . Thrombocytopenia (HCC)     Assessment/Plan:   6 Days Post-Op Procedure(s) (LRB): INTRAMEDULLARY (IM) NAIL INTERTROCHANTRIC (Left) Active Problems:   Hip fracture (HCC)  Estimated body mass index is 37.11 kg/m as calculated from the following:   Height as of this encounter: 5\' 10"  (1.778 m).   Weight as of this encounter: 117.3 kg (258 lb 9.6 oz). Advance diet Up with therapy  Incision sites appear well with mild serous drainage, no sign of infections.  Acute post op blood loss anemia - Labs pending Discharge to SNF pending BM and medical clearance Remove staples and apply steri strips on 07/21/16 Follow up with [facility] in 6 weeks   DVT Prophylaxis - Lovenox, Foot Pumps and TED hose Weight-Bearing as tolerated to left leg   T. Rachelle Hora, PA-C [place] 07/13/2016, 8:01 AM

## 2016-07-13 NOTE — Progress Notes (Signed)
Pre-hd tx 

## 2016-07-13 NOTE — Progress Notes (Signed)
[facility] NAME: Adedayo Stoessel    MR#:  [id number]  DATE OF BIRTH:  1941/08/20  SUBJECTIVE:  CHIEF COMPLAINT:   Chief Complaint  Patient presents with  . Fall  . Leg Pain     Came with a fall and hip fracture.   Also have A fib on coumadin , and CKD.    had hypotension after surgery, now stable.    calm and alert this morning, but somewhat confused. Tolerating diet .   Nephro decided to start hemodialysis, today the catheter in his right groin is displaced and pulled out.  REVIEW OF SYSTEMS:   Constitutional: Positive for malaise/fatigue. Negative for chills, fever and weight loss.  HENT: Negative for ear discharge, ear pain, hearing loss and nosebleeds.   Eyes: Negative for blurred vision, double vision and photophobia.  Respiratory: Negative for cough, hemoptysis, shortness of breath and wheezing.   Cardiovascular: Positive for leg swelling. Negative for chest pain, palpitations and orthopnea.  Gastrointestinal: Negative for abdominal pain, constipation, diarrhea, heartburn, melena, nausea and vomiting.  Genitourinary: Negative for dysuria and urgency.  Musculoskeletal: Positive for falls, joint pain and myalgias. Negative for back pain and neck pain.  Skin: Negative for rash.  Neurological: Negative for dizziness, tingling, sensory change, speech change, focal weakness and headaches.  Endo/Heme/Allergies: Does not bruise/bleed easily.  Psychiatric/Behavioral: Negative for depression.   ROS  DRUG ALLERGIES:  No Known Allergies  VITALS:  Blood pressure 117/69, pulse 76, temperature 97.9 F (36.6 C), temperature source Oral, resp. rate 16, height 5\' 10"  (1.778 m), weight 116.4 kg (256 lb 9.9 oz), SpO2 100 %.  PHYSICAL EXAMINATION:   GENERAL:  75 y.o.-year-old patient lying in the bed with no acute distress. Dissheleved appearing. EYES: Pupils equal, round, reactive to light and accommodation. No scleral icterus.  Extraocular muscles intact.  HEENT: Head atraumatic, normocephalic. Oropharynx and nasopharynx clear.  NECK:  Supple, no jugular venous distention. No thyroid enlargement, no tenderness.  LUNGS: Normal breath sounds bilaterally, no wheezing, rales,rhonchi or crepitation. No use of accessory muscles of respiration. Decreased bibasilar breath sounds. Some coarse coughing during my exam. CARDIOVASCULAR: S1, S2 normal. No  rubs, or gallops. 3/6 systolic murmur in place. ABDOMEN: Soft, nontender, nondistended. Bowel sounds present. No organomegaly or mass.  EXTREMITIES: Left thigh have dressing. Intact neurovascular bundle. No cyanosis, or clubbing. 2+ lower extremity edema with chronic pigmentation changes. Right groin hemodialysis catheter present which does not appear properly in position and coiled outside. NEUROLOGIC: Cranial nerves II through XII are intact. Muscle strength 4/5 in all extremities. Sensation intact. Gait not checked.  PSYCHIATRIC: The patient is alert and confused.  SKIN: No obvious rash, lesion, or ulcer.   Physical Exam LABORATORY PANEL:   CBC  Recent Labs Lab 07/11/16 0354  WBC 7.6  HGB 8.4*  HCT 24.6*  PLT 146*   ------------------------------------------------------------------------------------------------------------------  Chemistries   Recent Labs Lab 07/12/16 0408  NA 140  K 3.6  CL 104  CO2 29  GLUCOSE 107*  BUN 68*  CREATININE 2.48*  CALCIUM 9.6  MG 2.9*   ------------------------------------------------------------------------------------------------------------------  Cardiac Enzymes No results for input(s): TROPONINI in the last 168 hours. ------------------------------------------------------------------------------------------------------------------  RADIOLOGY:  Dg Chest 1 View  Result Date: 07/13/2016 CLINICAL DATA:  Hypoxia. History of atrial fibrillation, CAD, CHF, hypertension, MI, ICD. History of smoking. EXAM: CHEST 1 VIEW  COMPARISON:  07/07/2016 FINDINGS: Heart is enlarged. patient's left-sided AICD with leads to the right atrium, right ventricle,  and coronary sinus. There is perihilar pulmonary edema which is increased over prior study. No focal consolidations or definite pleural effusions. IMPRESSION: Cardiomegaly.  Increased perihilar edema. Electronically Signed   By: Nolon Nations M.D.   On: 07/13/2016 15:13    ASSESSMENT AND PLAN:   Active Problems:   Hip fracture (HCC)  #1 Left Hip fracture- secondary to fall. Orthopedics consulted. - Patient was announced moderate to high risk for surgery - Cardiology consult requested for cardiac clearance. INR is elevated, so held Coumadin. - One dose of oral vitamin K , given one dose [place] vit K .  - Explained risks to family and also discussed with orthopedic surgeon - Pain medication. 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 On Diuril takes at home. - Hold Lasix and metolazone. Nephrology consulted. - Gentle hydration today in follow-up. Renally adjust all medications. - Checked SPEP and UPEP and PTH as calcium also elevated. - appreciated nephrology help. worsening renal func. - much higher BUN than baseline.  Nephrology suggests to monitor in hospital and started hemodialysis on 07/11/16.   Today displaced hemodialysis catheters so need to have replaced and then plan is to have one more dialysis session.  #3 Ac on Chronic systolic CHF- well compensated, EF is 25%   Chronic lower extremity edema. Held Lasix and metolazone   No lasix for now as renal func worsening, and question of how much he will response.  Was  On Gentle IV hydration to help renal func.   IV fluids stopped, stable now.   BP is stable now to have good renal perfusion.   Now nephro is thinking about option to start HD.  #4 Afib- rate controlled, on  coreg-  - hold warfarin, s/p pacemaker  Hold COREG due to borderline BP.  Now on therapeutic lovenox.  re-started coumadin. Pharmacy to help dosing.  #5 Gout-continue home medications  #6 BPH- on flomax  #7 DVT Prophylaxis- now INR at 2, start after the surgery  #8 meningioma   incindental finding, discussed with his daughter in room.  # 9 hematuria   and UTI   Ur cx- morgenella, sensitive to Rocephin ( started 07/06/16- stop on 07/12/16.)   Monitor CBC. Hb stable.  # Acute post op anemia due to blood loss   Oral iron.  All the records are reviewed and case discussed with Care Management/Social Workerr. Management plans discussed with the patient, family and they are in agreement.  CODE STATUS: Full  TOTAL TIME TAKING CARE OF THIS PATIENT: 35 minutes.   POSSIBLE D/C IN 1-2 DAYS, DEPENDING ON CLINICAL CONDITION. Discussed with daughter in room and nephrologist.  Vaughan Basta M.D on 07/13/2016   Between 7am to 6pm - Pager - [phone]  After 6pm go to [email or web address] - password [email or web address] [facility]  Office  [phone]  CC: Primary care physician; [name], MD  Note: This dictation was prepared with Dragon dictation along with smaller phrase technology. Any transcriptional errors that result from this process are unintentional.

## 2016-07-14 LAB — BASIC METABOLIC PANEL
Anion gap: 4 — ABNORMAL LOW (ref 5–15)
BUN: 26 mg/dL — AB (ref 6–20)
CHLORIDE: 104 mmol/L (ref 101–111)
CO2: 33 mmol/L — AB (ref 22–32)
CREATININE: 1.49 mg/dL — AB (ref 0.61–1.24)
Calcium: 9.2 mg/dL (ref 8.9–10.3)
GFR calc Af Amer: 52 mL/min — ABNORMAL LOW (ref >60)
GFR calc non Af Amer: 45 mL/min — ABNORMAL LOW (ref >60)
Glucose, Bld: 115 mg/dL — ABNORMAL HIGH (ref 65–99)
POTASSIUM: 3.8 mmol/L (ref 3.5–5.1)
Sodium: 141 mmol/L (ref 135–145)

## 2016-07-14 LAB — PROTIME-INR
INR: 1.39
Prothrombin Time: 17.2 seconds — ABNORMAL HIGH (ref 11.4–15.2)

## 2016-07-14 MED ORDER — DEXTROSE 5 % IV SOLN
20.0000 mmol | Freq: Once | INTRAVENOUS | Status: AC
  Administered 2016-07-14: 20 mmol via INTRAVENOUS
  Filled 2016-07-14: qty 6.67

## 2016-07-14 MED ORDER — WARFARIN SODIUM 5 MG PO TABS
7.5000 mg | ORAL_TABLET | Freq: Once | ORAL | Status: AC
  Administered 2016-07-14: 7.5 mg via ORAL
  Filled 2016-07-14 (×2): qty 1

## 2016-07-14 MED ORDER — NEPRO/CARBSTEADY PO LIQD
237.0000 mL | Freq: Two times a day (BID) | ORAL | Status: DC
  Administered 2016-07-14 – 2016-07-17 (×5): 237 mL via ORAL

## 2016-07-14 MED ORDER — POLYETHYLENE GLYCOL 3350 17 G PO PACK: 17.0000 g | PACK | Freq: Every day | ORAL | Status: DC | PRN

## 2016-07-14 MED ORDER — SENNOSIDES-DOCUSATE SODIUM 8.6-50 MG PO TABS: 1.0000 | ORAL_TABLET | Freq: Every evening | ORAL | Status: DC | PRN

## 2016-07-14 NOTE — Progress Notes (Signed)
Physical Therapy Treatment Patient Details Name: Micheal [name] MRN: [id number] DOB: July 01, 1941 Today's Date: 07/14/2016    History of Present Illness Micheal [name] is a 75yo male who comes to [facility] after sustaining a fall at home with Left hip fracture. Pt was held for a few days until medically stable/cleared for surgery by cardiology, nephrology, and underwent Left hip ORIF/IM nailing on 10/13. Baseline level of funcitonal includes household AMB without AD, min-monA for bathing, and total assist for IADL. Pt does not tolerate long AMB distances. PMH: afib, PPM, CHF, EF: 25%, CKD-4. on POD he transferd from CCU to 1A    PT Comments    Pt is limited with mobility secondary to temporary femoral catheter.  He shows good effort with exercises but does need a few rest breaks and some extra cuing.  Pt with AROM/minimal resistance on a few exercises, but overall shows considerable weakness and fatigue.  Overall pt does show considerable strength limitations, but functional mobility training is deferred.  Follow Up Recommendations  SNF     Equipment Recommendations       Recommendations for Other Services       Precautions / Restrictions Precautions Precautions: Fall Restrictions LLE Weight Bearing: Weight bearing as tolerated    Mobility  Bed Mobility               General bed mobility comments: All mobility deferred per nephrologist/temp fem cath  Transfers                    Ambulation/Gait                 Stairs            Wheelchair Mobility    Modified Rankin (Stroke Patients Only)       Balance                                    Cognition Arousal/Alertness: Awake/alert Behavior During Therapy: WFL for tasks assessed/performed;Flat affect Overall Cognitive Status: Impaired/Different from baseline                      Exercises General Exercises - Lower Extremity Ankle Circles/Pumps: 20  reps;AROM;Strengthening Quad Sets: 15 reps;Strengthening Gluteal Sets: Strengthening;15 reps Short Arc Quad: 20 reps;AROM;Strengthening Heel Slides: AROM;AAROM;15 reps Hip ABduction/ADduction: Strengthening;20 reps Straight Leg Raises: AROM;10 reps;AAROM    General Comments        Pertinent Vitals/Pain Pain Assessment: 0-10 Pain Score:  (minimal pain at rest, increases (6/10) with most activity) Pain Location: L hip    Home Living                      Prior Function            PT Goals (current goals can now be found in the care plan section) Progress towards PT goals: Progressing toward goals    Frequency    BID      PT Plan Current plan remains appropriate    Co-evaluation             End of Session Equipment Utilized During Treatment: Oxygen Activity Tolerance: Patient tolerated treatment well Patient left: with call bell/phone within reach;with bed alarm set     Time: 1341-1406 PT Time Calculation (min) (ACUTE ONLY): 25 min  Charges:  $Therapeutic Exercise: 23-37 mins  G Codes:      Kreg Shropshire, DPT 07/14/2016, 3:38 PM

## 2016-07-14 NOTE — Progress Notes (Signed)
Physical Therapy Treatment Patient Details Name: Micheal [name] MRN: [id number] DOB: [birth year] Today's Date: 07/14/2016    History of Present Illness Micheal [name] is a 76yo white male who comes to [facility] after sustaining a fall at home with Left hip fracture. Pt was held for a few days until medically stable/cleared for surgery by cardiology, nephrology, adn underwent Left hip ORIF/IM nailing on 10/13. Baseline level of funcitonal includes household AMB without AD, min-monA for bathing, and total assist for IADL. Pt does not tolerate long AMB distances. PMH: afib, PPM, CHF, EF: 25%, CKD-4. on POD he transferd from CCU to 1A.     PT Comments    Pt demonstrates ability to complete all supine exercises as instructed. Affect appears to be slightly improved today but still very flat. Daughter present for entire session. All bed mobility, transfers, and ambulation again deferred today per nephrology secondary to temporary R femoral catheter. Will continue to work with patient on bed level exercises and progress once R temp fem cath removed. Pt will benefit from skilled PT services to address deficits in strength, balance, and mobility in order to return to full function at home.   Follow Up Recommendations  SNF     Equipment Recommendations  Other (comment) (TBD at SNF)    Recommendations for Other Services       Precautions / Restrictions Precautions Precautions: Fall Restrictions Weight Bearing Restrictions: Yes LLE Weight Bearing: Weight bearing as tolerated Other Position/Activity Restrictions: Remote Left UE fracture, non-surgical, with chronic functional hypofunciton.     Mobility  Bed Mobility               General bed mobility comments: Deferred per nephrologist  Transfers                 General transfer comment: Deferred per nephrologist  Ambulation/Gait             General Gait Details: Deferred per nephrologist   Stairs             Wheelchair Mobility    Modified Rankin (Stroke Patients Only)       Balance                                    Cognition Arousal/Alertness: Lethargic Behavior During Therapy: WFL for tasks assessed/performed;Flat affect Overall Cognitive Status: Impaired/Different from baseline                      Exercises General Exercises - Lower Extremity Ankle Circles/Pumps: AROM;Both;15 reps;Supine Quad Sets: Strengthening;Both;15 reps;Supine Gluteal Sets: Strengthening;Both;Supine;15 reps Short Arc Quad: Strengthening;Left;15 reps;Supine Hip ABduction/ADduction: Strengthening;Left;15 reps;Supine Straight Leg Raises: Strengthening;Left;15 reps;Supine    General Comments        Pertinent Vitals/Pain Pain Assessment: No/denies pain Pain Location: Denies L hip pain at rest at this time, increases slightly with movement. Does not rate Pain Intervention(s): Monitored during session    Home Living                      Prior Function            PT Goals (current goals can now be found in the care plan section) Acute Rehab PT Goals Patient Stated Goal: To return home to PLOF. PT Goal Formulation: With patient Time For Goal Achievement: 07/22/16 Potential to Achieve Goals: Fair Progress towards PT goals: Progressing toward goals  Frequency    BID      PT Plan Current plan remains appropriate    Co-evaluation             End of Session Equipment Utilized During Treatment: Oxygen Activity Tolerance: Patient tolerated treatment well Patient left: in bed;with call bell/phone within reach;with bed alarm set;with family/visitor present;Other (comment) (MD and RN in room with patient)     Time: 1005-1017 PT Time Calculation (min) (ACUTE ONLY): 12 min  Charges:  $Therapeutic Exercise: 8-22 mins                    G Codes:      Lyndel Safe Noa Constante PT, DPT   Shriyans Kuenzi 07/14/2016, 10:27 AM

## 2016-07-14 NOTE — Progress Notes (Signed)
[facility]  Daily Progress Note   Subjective: 2 Days Post-Op: Exchanged temporary dialysis catheter over a wire same venous access 1 Days Post-Op: Insertion of temporary dialysis catheter right femoral approach.  Patient without complaint this afternoon. No issues with dialysis through catheter.   Objective: Vitals:   07/13/16 2000 07/14/16 0028 07/14/16 0432 07/14/16 0800  BP: (!) 122/51 107/68 125/73 123/60  Pulse: 75 75 75 75  Resp: 16 18 18 20   Temp: 98.7 F (37.1 C) 98.8 F (37.1 C) 98.2 F (36.8 C) 98.8 F (37.1 C)  TempSrc: Oral Oral Oral Oral  SpO2: 98% 100% 100% 100%  Weight:      Height:        Intake/Output Summary (Last 24 hours) at 07/14/16 1343 Last data filed at 07/14/16 1318  Gross per 24 hour  Intake              170 ml  Output              300 ml  Net             -130 ml   Physical Exam: A&Ox3, NAD CV: RRR Pulmonary: CTA Bilaterally Abdomen: Soft, Nontender, Nondistended Right Groin: Dialysis catheter in place. Intact and secure. No signs of infection or swelling noted.  Extremities: Warm, non-tender, minimal edema   Laboratory: CBC    Component Value Date/Time   WBC 7.6 07/11/2016 0354   HGB 8.4 (L) 07/11/2016 0354   HGB 12.0 (L) 09/09/2014 0657   HCT 24.6 (L) 07/11/2016 0354   HCT 37.2 (L) 09/09/2014 0657   PLT 146 (L) 07/11/2016 0354   PLT 152 09/09/2014 0657   BMET    Component Value Date/Time   NA 141 07/14/2016 0418   NA 137 09/10/2014 0653   K 3.8 07/14/2016 0418   K 3.4 (L) 09/10/2014 0653   CL 104 07/14/2016 0418   CL 103 09/10/2014 0653   CO2 33 (H) 07/14/2016 0418   CO2 28 09/10/2014 0653   GLUCOSE 115 (H) 07/14/2016 0418   GLUCOSE 152 (H) 09/10/2014 0653   BUN 26 (H) 07/14/2016 0418   BUN 64 (H) 09/10/2014 0653   CREATININE 1.49 (H) 07/14/2016 0418   CREATININE 2.44 (H) 09/10/2014 0653   CALCIUM 9.2 07/14/2016 0418   CALCIUM 9.2 09/10/2014 0653   GFRNONAA 45 (L) 07/14/2016 0418   GFRNONAA 28  (L) 09/10/2014 0653   GFRNONAA 35 (L) 04/18/2013 0429   GFRAA 52 (L) 07/14/2016 0418   GFRAA 34 (L) 09/10/2014 0653   GFRAA 40 (L) 04/18/2013 0429   Assessment/Planning: 75 year old male s/p 2 Days Post-Op: Exchanged temporary dialysis catheter over a wire same venous access and 1 Days Post-Op: Insertion of temporary dialysis catheter right femoral approach - Catheter working appropriately. 1) Catheter secure and in place working appropriately. 2) Please re-consult if patient needs permcath and placement of fistula / graft.   Marcelle Overlie PA-C 07/14/2016 1:43 PM

## 2016-07-14 NOTE — Progress Notes (Signed)
   Subjective: 7 Days Post-Op Procedure(s) (LRB): INTRAMEDULLARY (IM) NAIL INTERTROCHANTRIC (Left) Patient reports pain as mild with rest.  Patient is well, and has had no acute complaints or problems Denies any CP, SOB, ABD pain. Plan is to go Skilled nursing facility after hospital stay.  Objective: Vital signs in last 24 hours: Temp:  [97.9 F (36.6 C)-98.8 F (37.1 C)] 98.8 F (37.1 C) (10/20 0800) Pulse Rate:  [57-76] 75 (10/20 0800) Resp:  [13-21] 20 (10/20 0800) BP: (104-125)/(51-73) 123/60 (10/20 0800) SpO2:  [92 %-100 %] 100 % (10/20 0800) Weight:  [116.4 kg (256 lb 9.9 oz)] 116.4 kg (256 lb 9.9 oz) (10/19 1237)  Intake/Output from previous day: 10/19 0701 - 10/20 0700 In: 170 [I.V.:170] Out: 500 [Urine:500] Intake/Output this shift: No intake/output data recorded.  No results for input(s): HGB in the last 72 hours. No results for input(s): WBC, RBC, HCT, PLT in the last 72 hours.  Recent Labs  07/12/16 0408 07/14/16 0418  NA 140 141  K 3.6 3.8  CL 104 104  CO2 29 33*  BUN 68* 26*  CREATININE 2.48* 1.49*  GLUCOSE 107* 115*  CALCIUM 9.6 9.2    Recent Labs  07/13/16 0609 07/14/16 0418  INR 1.31 1.39    EXAM General - Patient is Alert, Appropriate and Oriented Extremity - Neurovascular intact Sensation intact distally Intact pulses distally Dorsiflexion/Plantar flexion intact No cellulitis present Compartment soft no edema  Proximal thigh soft Dressing - dressing C/D/I and scant drainage. No erythema. Dressing Changed Motor Function - intact, moving foot and toes well on exam.   Past Medical History:  Diagnosis Date  . Anxiety   . Atrial fibrillation ([place])   . 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 CAD (coronary artery disease)   . CHF (congestive heart failure) ([place])   . Chronic back pain   . Chronic systolic heart failure (HCC) ~2009   EF under 25%  . CKD (chronic kidney disease) stage 3, GFR 30-59 ml/min  2009   stage III  . Dyspnea    with exertion  . Fracture closed, humerus    never hadsurgery, left  . Gout, unspecified   . Hypertension   . Myocardial infarction    pt is unaware of this but doctors say he has  . Other left bundle branch block   . Thrombocytopenia (HCC)     Assessment/Plan:   7 Days Post-Op Procedure(s) (LRB): INTRAMEDULLARY (IM) NAIL INTERTROCHANTRIC (Left) Active Problems:   Hip fracture (HCC)  Estimated body mass index is 36.82 kg/m as calculated from the following:   Height as of this encounter: 5\' 10"  (1.778 m).   Weight as of this encounter: 116.4 kg (256 lb 9.9 oz). Advance diet Up with therapy  Incision sites appear well with mild serous drainage, no erythema or warmth Acute post op blood loss anemia - Labs pending Discharge to SNF pending BM and medical clearance Remove staples and apply steri strips on 07/21/16 Follow up with [place] ortho in 6 weeks   DVT Prophylaxis - Lovenox, Foot Pumps and TED hose Weight-Bearing as tolerated to left leg   T. Rachelle Hora, PA-C [place] 07/14/2016, 8:14 AM

## 2016-07-14 NOTE — Progress Notes (Signed)
[place] [facility] [place] NAME: Micheal [name]    MR#:  [id number]  DATE OF BIRTH:  Dec 11, 1940  SUBJECTIVE:  CHIEF COMPLAINT:   Chief Complaint  Patient presents with  . Fall  . Leg Pain     Came with a fall and hip fracture.   Also have A fib on coumadin , and CKD.    had hypotension after surgery, now stable.    calm and alert this morning, more oriented. Tolerating diet .   Nephro decided to start hemodialysis, received 3 HD sessions.  REVIEW OF SYSTEMS:   Constitutional: Positive for malaise/fatigue. Negative for chills, fever and weight loss.  HENT: Negative for ear discharge, ear pain, hearing loss and nosebleeds.   Eyes: Negative for blurred vision, double vision and photophobia.  Respiratory: Negative for cough, hemoptysis, shortness of breath and wheezing.   Cardiovascular: Positive for leg swelling. Negative for chest pain, palpitations and orthopnea.  Gastrointestinal: Negative for abdominal pain, constipation, diarrhea, heartburn, melena, nausea and vomiting.  Genitourinary: Negative for dysuria and urgency.  Musculoskeletal: Positive for falls, joint pain and myalgias. Negative for back pain and neck pain.  Skin: Negative for rash.  Neurological: Negative for dizziness, tingling, sensory change, speech change, focal weakness and headaches.  Endo/Heme/Allergies: Does not bruise/bleed easily.  Psychiatric/Behavioral: Negative for depression.   ROS  DRUG ALLERGIES:  No Known Allergies  VITALS:  Blood pressure 132/67, pulse 75, temperature 98.8 F (37.1 C), temperature source Oral, resp. rate 20, height 5\' 10"  (1.778 m), weight 116.4 kg (256 lb 9.9 oz), SpO2 100 %.  PHYSICAL EXAMINATION:   GENERAL:  75 y.o.-year-old patient lying in the bed with no acute distress. Dissheleved appearing. EYES: Pupils equal, round, reactive to light and accommodation. No scleral icterus. Extraocular muscles intact.  HEENT: Head atraumatic,  normocephalic. Oropharynx and nasopharynx clear.  NECK:  Supple, no jugular venous distention. No thyroid enlargement, no tenderness.  LUNGS: Normal breath sounds bilaterally, no wheezing, rales,rhonchi or crepitation. No use of accessory muscles of respiration. Decreased bibasilar breath sounds. Some coarse coughing during my exam. CARDIOVASCULAR: S1, S2 normal. No  rubs, or gallops. 3/6 systolic murmur in place. ABDOMEN: Soft, nontender, nondistended. Bowel sounds present. No organomegaly or mass.  EXTREMITIES: Left thigh have dressing. Intact neurovascular bundle. No cyanosis, or clubbing. 2+ lower extremity edema with chronic pigmentation changes. Right groin hemodialysis catheter present. NEUROLOGIC: Cranial nerves II through XII are intact. Muscle strength 4/5 in all extremities. Sensation intact. Gait not checked.  PSYCHIATRIC: The patient is alert and oriented X3.  SKIN: No obvious rash, lesion, or ulcer.   Physical Exam LABORATORY PANEL:   CBC  Recent Labs Lab 07/11/16 0354  WBC 7.6  HGB 8.4*  HCT 24.6*  PLT 146*   ------------------------------------------------------------------------------------------------------------------  Chemistries   Recent Labs Lab 07/12/16 0408 07/14/16 0418  NA 140 141  K 3.6 3.8  CL 104 104  CO2 29 33*  GLUCOSE 107* 115*  BUN 68* 26*  CREATININE 2.48* 1.49*  CALCIUM 9.6 9.2  MG 2.9*  --    ------------------------------------------------------------------------------------------------------------------  Cardiac Enzymes No results for input(s): TROPONINI in the last 168 hours. ------------------------------------------------------------------------------------------------------------------  RADIOLOGY:  Dg Chest 1 View  Result Date: 07/13/2016 CLINICAL DATA:  Hypoxia. History of atrial fibrillation, CAD, CHF, hypertension, MI, ICD. History of smoking. EXAM: CHEST 1 VIEW COMPARISON:  07/07/2016 FINDINGS: Heart is enlarged.  patient's left-sided AICD with leads to the right atrium, right ventricle, and coronary sinus. There is  perihilar pulmonary edema which is increased over prior study. No focal consolidations or definite pleural effusions. IMPRESSION: Cardiomegaly.  Increased perihilar edema. Electronically Signed   By: Nolon Nations M.D.   On: 07/13/2016 15:13    ASSESSMENT AND PLAN:   Active Problems:   Hip fracture (HCC)  #1 Left Hip fracture- secondary to fall. Orthopedics consulted. - Patient was announced moderate to high risk for surgery - Cardiology consult requested for cardiac clearance. INR is elevated, so held Coumadin. - One dose of oral vitamin K , given one dose [place] vit K .  - Explained risks to family and also discussed with orthopedic surgeon - Pain medication. Patient has high tolerance to pain medications as he is on chronic pain medications for his back pain   Done sx 07/07/16   Participating with PT , may have extended recovery due to other medical issues.  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 On Diuril takes at home. - Hold Lasix and metolazone. Nephrology consulted. - Gentle hydration today in follow-up. Renally adjust all medications. - Checked SPEP and UPEP and PTH as calcium also elevated. - appreciated nephrology help. worsening renal func. - much higher BUN than baseline.  Nephrology suggests to monitor in hospital and started hemodialysis on 07/11/16.   nephro continued HD, now may decide further need, if only acute or chronic need for HD.  #3 Ac on Chronic systolic CHF- well compensated, EF is 25%   Chronic lower extremity edema. Held Lasix and metolazone   No lasix for now as renal func worsening, and question of how much he will response.  Was  On Gentle IV hydration to help renal func.   IV fluids stopped, stable now.   BP is stable now to have good renal perfusion.   nephro started on HD.  #4 Afib- rate controlled, on coreg-  - hold warfarin, s/p  pacemaker  Hold COREG due to borderline BP.  Now on therapeutic lovenox.  re-started coumadin. Pharmacy to help dosing.  #5 Gout-continue home medications  #6 BPH- on flomax  #7 DVT Prophylaxis- now INR at 2, start after the surgery  #8 meningioma   incindental finding, discussed with his daughter in room.  # 9 hematuria   and UTI   Ur cx- morgenella, sensitive to Rocephin ( started 07/06/16- stop on 07/12/16.)   Monitor CBC. Hb stable.  # Acute post op anemia due to blood loss   Oral iron.  # acute metabolic encephalopathy due to uremia   HD started by nephrology. Improved.  All the records are reviewed and case discussed with Care Management/Social Workerr. Management plans discussed with the patient, family and they are in agreement.  CODE STATUS: Full  TOTAL TIME TAKING CARE OF THIS PATIENT: 35 minutes.   POSSIBLE D/C IN 2-3 DAYS, DEPENDING ON CLINICAL CONDITION. Discussed with daughter in room and nephrologist.  Vaughan Basta M.D on 07/14/2016   Between 7am to 6pm - Pager - [phone]  After 6pm go to [email or web address] - password [email or web address] [facility]  Office  [phone]  CC: Primary care physician; [name], MD  Note: This dictation was prepared with Dragon dictation along with smaller phrase technology. Any transcriptional errors that result from this process are unintentional.

## 2016-07-14 NOTE — Progress Notes (Signed)
Subjective:  Resting quietly Denies acte c/o Appetite is poor Has not touched lunch tray from today S Cr 1.49/BUN 26 . Last HD 10/19 UOP 500 cc  Objective:  Vital signs in last 24 hours:  Temp:  [98.2 F (36.8 C)-98.8 F (37.1 C)] 98.8 F (37.1 C) (10/20 0800) Pulse Rate:  [75] 75 (10/20 1556) Resp:  [16-20] 20 (10/20 0800) BP: (107-132)/(51-73) 132/67 (10/20 1556) SpO2:  [98 %-100 %] 100 % (10/20 1556)  Weight change: -0.9 kg (-1 lb 15.7 oz) Filed Weights   07/12/16 2201 07/13/16 0925 07/13/16 1237  Weight: 117.3 kg (258 lb 9.6 oz) 116.4 kg (256 lb 9.9 oz) 116.4 kg (256 lb 9.9 oz)    Intake/Output:    Intake/Output Summary (Last 24 hours) at 07/14/16 1734 Last data filed at 07/14/16 1500  Gross per 24 hour  Intake              170 ml  Output              400 ml  Net             -230 ml     Physical Exam: General: Chronically ill-appearing, laying in bed  HEENT Anicteric, moist mucous membranes  Neck supple  Pulm/lungs Normal breathing effort, decreased breath sounds at bases  CVS/Heart no rub or gallop, paced rhythm  Abdomen:  Soft, nontender, nondistended  Extremities: 1+ pitting edema bilaterally, tight edema  Neurologic: Awake, alert, following commands  Skin: No rashes  Access: Right femoral dialysis catheter       Basic Metabolic Panel:   Recent Labs Lab 07/09/16 0420 07/10/16 0349 07/11/16 0354 07/11/16 1800 07/12/16 0408 07/13/16 1014 07/14/16 0418  NA 136 136 137  --  140  --  141  K 3.6 3.7 3.8  --  3.6  --  3.8  CL 99* 100* 101  --  104  --  104  CO2 29 29 27   --  29  --  33*  GLUCOSE 144* 116* 113*  --  107*  --  115*  BUN 93* 99* 95*  --  68*  --  26*  CREATININE 3.86* 3.63* 3.37*  --  2.48*  --  1.49*  CALCIUM 8.8* 8.8* 9.4  --  9.6  --  9.2  MG  --   --   --   --  2.9*  --   --   PHOS  --   --   --  3.4 2.8 1.9*  --      CBC:  Recent Labs Lab 07/08/16 0412 07/10/16 0349 07/11/16 0354  WBC 13.4* 7.0 7.6  HGB 8.7* 8.6*  8.4*  HCT 26.5* 25.7* 24.6*  MCV 90.3 90.5 88.7  PLT 126* 124* 146*      Microbiology:  Recent Results (from the past 720 hour(s))  Urine culture     Status: Abnormal   Collection Time: 07/05/16  7:59 AM  Result Value Ref Range Status   Specimen Description URINE, RANDOM  Final   Special Requests NONE  Final   Culture >=100,000 COLONIES/mL MORGANELLA MORGANII (A)  Final   Report Status 07/08/2016 FINAL  Final   Organism ID, Bacteria MORGANELLA MORGANII (A)  Final      Susceptibility   Morganella morganii - MIC*    AMPICILLIN >=32 RESISTANT Resistant     CEFAZOLIN >=64 RESISTANT Resistant     CEFTRIAXONE <=1 SENSITIVE Sensitive     CIPROFLOXACIN <=0.25 SENSITIVE Sensitive  GENTAMICIN <=1 SENSITIVE Sensitive     IMIPENEM 4 SENSITIVE Sensitive     NITROFURANTOIN 64 RESISTANT Resistant     TRIMETH/SULFA <=20 SENSITIVE Sensitive     AMPICILLIN/SULBACTAM >=32 RESISTANT Resistant     PIP/TAZO <=4 SENSITIVE Sensitive     * >=100,000 COLONIES/mL MORGANELLA MORGANII  Surgical PCR screen     Status: Abnormal   Collection Time: 07/05/16  1:18 PM  Result Value Ref Range Status   MRSA, PCR POSITIVE (A) NEGATIVE Final    Comment: RESULT CALLED TO, READ BACK BY AND VERIFIED WITH: ERIN CARTER ON 07/05/16 AT 1553 [place]    Staphylococcus aureus POSITIVE (A) NEGATIVE Final    Comment:        The Xpert SA Assay (FDA approved for NASAL specimens in patients over 62 years of age), is one component of a comprehensive surveillance program.  Test performance has been validated by [facility] for patients greater than or equal to 41 year old. It is not intended to diagnose infection nor to guide or monitor treatment.     Coagulation Studies:  Recent Labs  07/12/16 0408 07/13/16 0609 07/14/16 0418  LABPROT 17.5* 16.4* 17.2*  INR 1.42 1.31 1.39    Urinalysis: No results for input(s): COLORURINE, LABSPEC, PHURINE, GLUCOSEU, HGBUR, BILIRUBINUR, KETONESUR, PROTEINUR, UROBILINOGEN,  NITRITE, LEUKOCYTESUR in the last 72 hours.  Invalid input(s): APPERANCEUR    Imaging: Dg Chest 1 View  Result Date: 07/13/2016 CLINICAL DATA:  Hypoxia. History of atrial fibrillation, CAD, CHF, hypertension, MI, ICD. History of smoking. EXAM: CHEST 1 VIEW COMPARISON:  07/07/2016 FINDINGS: Heart is enlarged. patient's left-sided AICD with leads to the right atrium, right ventricle, and coronary sinus. There is perihilar pulmonary edema which is increased over prior study. No focal consolidations or definite pleural effusions. IMPRESSION: Cardiomegaly.  Increased perihilar edema. Electronically Signed   By: Nolon Nations M.D.   On: 07/13/2016 15:13     Medications:   . sodium chloride 40 mL/hr at 07/14/16 0938   . ALPRAZolam  0.5 mg Oral TID  . calcitRIOL  0.25 mcg Oral Daily  . calcium-vitamin D  1 tablet Oral BID  . Chlorhexidine Gluconate Cloth  6 each Topical Q0600  . citalopram  40 mg Oral Daily  . enoxaparin (LOVENOX) injection  1 mg/kg Subcutaneous Q24H  . febuxostat  40 mg Oral QHS  . feeding supplement (NEPRO CARB STEADY)  237 mL Oral BID BM  . ferrous sulfate  325 mg Oral BID WC  . mupirocin ointment  1 application Nasal BID  . pravastatin  80 mg Oral q1800  . sodium phosphate  Dextrose 5% IVPB  20 mmol Intravenous Once  . tamsulosin  0.4 mg Oral Daily  . warfarin  7.5 mg Oral ONCE-1800  . Warfarin - Pharmacist Dosing Inpatient   Does not apply q1800   acetaminophen **OR** acetaminophen, alum & mag hydroxide-simeth, cyclobenzaprine, guaiFENesin-dextromethorphan, HYDROcodone-acetaminophen, HYDROmorphone (DILAUDID) injection, ipratropium-albuterol, magnesium citrate, magnesium hydroxide, menthol-cetylpyridinium **OR** phenol, metoCLOPramide **OR** metoCLOPramide (REGLAN) injection, ondansetron **OR** ondansetron (ZOFRAN) IV, polyethylene glycol, senna-docusate  Assessment/ Plan:  75 y.o.caucasian male with hypertension, morbid obesity, gout, history of nonsteroidal use,  chronic atrial fibrillation, hyperlipidemia,diabetes, depression, ICD, congestive heart failure with LVEF 25%, chronic kidney disease now presents with comminuted intertrochanteric fracture of the left femur  1.  Acute renal failure on chronic kidney disease stage IV Cause of ARF not entirely clear but likely secondary to volume depletion/UTI.  - The patient's dialysis catheter was replaced .  His last  HD was Thursday 10/19 Patient did not want long-term dialysis. We were treating the acute component of renal failure. Mental status has improved   2.  Anemia chronic kidney disease.  Hemoglobin 8.4. Recommend continued monitoring. If the patient recovers from hip fracture we could potentially consider Epogen.  3.  Lower extremity edema -  Appears to be stable at the moment. Patient off diuretic therapy.    LOS: 9 Juris Gosnell 10/20/20175:34 PM

## 2016-07-14 NOTE — Consult Note (Signed)
ANTICOAGULATION CONSULT NOTE - Initial Consult  Pharmacy Consult for warfarin Indication: atrial fibrillation  No Known Allergies  Patient Measurements: Height: 5\' 10"  (177.8 cm) Weight: 256 lb 9.9 oz (116.4 kg) IBW/kg (Calculated) : 73 Heparin Dosing Weight:   Vital Signs: Temp: 98.2 F (36.8 C) (10/20 0432) Temp Source: Oral (10/20 0432) BP: 125/73 (10/20 0432) Pulse Rate: 75 (10/20 0432)  Labs:  Recent Labs  07/12/16 0408 07/13/16 0609 07/14/16 0418  LABPROT 17.5* 16.4* 17.2*  INR 1.42 1.31 1.39  CREATININE 2.48*  --  1.49*    Estimated Creatinine Clearance: 56.5 mL/min (by C-G formula based on SCr of 1.49 mg/dL (H)).   Medical History: Past Medical History:  Diagnosis Date  . Anxiety   . Atrial fibrillation ([place])   . 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 CAD (coronary artery disease)   . CHF (congestive heart failure) ([place])   . Chronic back pain   . Chronic systolic heart failure (HCC) ~2009   EF under 25%  . CKD (chronic kidney disease) stage 3, GFR 30-59 ml/min 2009   stage III  . Dyspnea    with exertion  . Fracture closed, humerus    never hadsurgery, left  . Gout, unspecified   . Hypertension   . Myocardial infarction    pt is unaware of this but doctors say he has  . Other left bundle branch block   . Thrombocytopenia (HCC)     Medications:  Scheduled:  . ALPRAZolam  0.5 mg Oral TID  . calcitRIOL  0.25 mcg Oral Daily  . calcium-vitamin D  1 tablet Oral BID  . Chlorhexidine Gluconate Cloth  6 each Topical Q0600  . citalopram  40 mg Oral Daily  . enoxaparin (LOVENOX) injection  1 mg/kg Subcutaneous Q24H  . febuxostat  40 mg Oral QHS  . ferrous sulfate  325 mg Oral BID WC  . mupirocin ointment  1 application Nasal BID  . polyethylene glycol  17 g Oral Daily  . pravastatin  80 mg Oral q1800  . senna-docusate  1 tablet Oral BID  . tamsulosin  0.4 mg Oral Daily  . Warfarin - Pharmacist Dosing Inpatient   Does  not apply q1800    Assessment: Pt is a 75 year old male w/ a PMH of Afib on warfarin at home. Pt is s/p left hip surgery. INR is subtherapeutic at 1.5. Pt received a total of 10mg  vit K (5mg  po, 5mg  Iv). Pharmacy consulted to restart warfarin. Patient on Lovenox bridge.  Pt home dose is 5mg  Sun,MWF and 2.5 T,T,sat. Pt last warfarin dose was 10/10  Date  INR  Warfarin Dose 10/11  2.01  0, vit K 5mg  PO 10/12  2.01  0, vit K 5mg  IV 10/13  1.79  none 10/14  1.50   5 mg 10/15  1.44  5 mg 10/16  1.40                 5 mg 10/17            1.36  Not given 10/18  1.42  6mg  10/19  1.31  6mg  10/20  1.39   Goal of Therapy:  INR 2-3  Monitor platelets by anticoagulation protocol: Yes   Plan:  INR slow to increase, effects of vit K are likely gone by now. Patient however missed dose the evening of 10/17. Will order warfarin 7.5 mg  tonight. Recheck INR in the AM. Continue  lovenox brdige 1mg /kg q 12 hrs  Kyan Giannone D Marilu Rylander 07/14/2016,7:53 AM

## 2016-07-14 NOTE — Progress Notes (Signed)
LCSW following for disposition:  Patient continues to need HD and still not medically cleared.  He has been given bed offers and chosen [facility] however due to unknown HD and plan, bed was given up. If patient on HD at discharge he will not be able to [facility] to [facility].  Patient does have other bed offers that will be given to him once medically cleared and known outcome of HD.  LCSW will continue to follow and assist with discharge plan.  Lane Hacker, MSW Clinical Social Work: [employer]

## 2016-07-15 LAB — BASIC METABOLIC PANEL
ANION GAP: 4 — AB (ref 5–15)
BUN: 31 mg/dL — AB (ref 6–20)
CALCIUM: 9.8 mg/dL (ref 8.9–10.3)
CO2: 32 mmol/L (ref 22–32)
CREATININE: 1.59 mg/dL — AB (ref 0.61–1.24)
Chloride: 104 mmol/L (ref 101–111)
GFR calc Af Amer: 48 mL/min — ABNORMAL LOW (ref >60)
GFR, EST NON AFRICAN AMERICAN: 41 mL/min — AB (ref >60)
GLUCOSE: 111 mg/dL — AB (ref 65–99)
Potassium: 3.8 mmol/L (ref 3.5–5.1)
Sodium: 140 mmol/L (ref 135–145)

## 2016-07-15 LAB — PHOSPHORUS: Phosphorus: 2.7 mg/dL (ref 2.5–4.6)

## 2016-07-15 LAB — CBC
HCT: 27.2 % — ABNORMAL LOW (ref 40.0–52.0)
Hemoglobin: 9.1 g/dL — ABNORMAL LOW (ref 13.0–18.0)
MCH: 30.1 pg (ref 26.0–34.0)
MCHC: 33.4 g/dL (ref 32.0–36.0)
MCV: 90.2 fL (ref 80.0–100.0)
PLATELETS: 190 10*3/uL (ref 150–440)
RBC: 3.01 MIL/uL — AB (ref 4.40–5.90)
RDW: 14.5 % (ref 11.5–14.5)
WBC: 8.1 10*3/uL (ref 3.8–10.6)

## 2016-07-15 LAB — PROTIME-INR
INR: 1.39
PROTHROMBIN TIME: 17.2 s — AB (ref 11.4–15.2)

## 2016-07-15 MED ORDER — ALPRAZOLAM 0.5 MG PO TABS: 0.5000 mg | ORAL_TABLET | Freq: Two times a day (BID) | ORAL | Status: DC

## 2016-07-15 MED ORDER — HYDROXYZINE HCL 25 MG PO TABS
25.0000 mg | ORAL_TABLET | Freq: Three times a day (TID) | ORAL | Status: DC | PRN
  Administered 2016-07-15 – 2016-07-17 (×3): 25 mg via ORAL
  Filled 2016-07-15 (×3): qty 1

## 2016-07-15 MED ORDER — ENOXAPARIN SODIUM 120 MG/0.8ML ~~LOC~~ SOLN
1.0000 mg/kg | Freq: Two times a day (BID) | SUBCUTANEOUS | Status: DC
  Filled 2016-07-15: qty 0.8

## 2016-07-15 MED ORDER — ENOXAPARIN SODIUM 120 MG/0.8ML ~~LOC~~ SOLN
1.0000 mg/kg | Freq: Once | SUBCUTANEOUS | Status: AC
  Administered 2016-07-15: 115 mg via SUBCUTANEOUS
  Filled 2016-07-15: qty 0.8

## 2016-07-15 MED ORDER — WARFARIN SODIUM 5 MG PO TABS
7.5000 mg | ORAL_TABLET | Freq: Once | ORAL | Status: AC
  Administered 2016-07-15: 7.5 mg via ORAL
  Filled 2016-07-15: qty 1

## 2016-07-15 MED ORDER — ALPRAZOLAM 0.5 MG PO TABS
0.5000 mg | ORAL_TABLET | Freq: Three times a day (TID) | ORAL | Status: DC
  Administered 2016-07-15 – 2016-07-17 (×6): 0.5 mg via ORAL
  Filled 2016-07-15 (×6): qty 1

## 2016-07-15 MED ORDER — ENOXAPARIN SODIUM 120 MG/0.8ML ~~LOC~~ SOLN
115.0000 mg | Freq: Two times a day (BID) | SUBCUTANEOUS | Status: DC
  Administered 2016-07-15 – 2016-07-17 (×3): 115 mg via SUBCUTANEOUS
  Filled 2016-07-15 (×5): qty 0.8

## 2016-07-15 NOTE — Plan of Care (Signed)
Problem: Nutrition: Goal: Adequate nutrition will be maintained Outcome: Progressing Pt continues to progress toward d/c for rehab. Family members have been at bedside, pt has remained free of panic attacks.

## 2016-07-15 NOTE — Progress Notes (Signed)
   Subjective: 8 Days Post-Op Procedure(s) (LRB): INTRAMEDULLARY (IM) NAIL INTERTROCHANTRIC (Left) Patient reports pain as mild with rest.  Patient is well, and has had no acute complaints or problems Denies any CP, SOB, ABD pain. Plan is to go Skilled nursing facility after hospital stay.  Objective: Vital signs in last 24 hours: Temp:  [98.5 F (36.9 C)-98.9 F (37.2 C)] 98.9 F (37.2 C) (10/21 0725) Pulse Rate:  [74-75] 75 (10/21 0725) Resp:  [2-20] 18 (10/21 0725) BP: (122-132)/(57-69) 126/58 (10/21 0725) SpO2:  [99 %-100 %] 99 % (10/21 0725) Weight:  [116 kg (255 lb 11.7 oz)] 116 kg (255 lb 11.7 oz) (10/21 0412)  Intake/Output from previous day: 10/20 0701 - 10/21 0700 In: 1048 [I.V.:1048] Out: 550 [Urine:550] Intake/Output this shift: No intake/output data recorded.   Recent Labs  07/15/16 0546  HGB 9.1*    Recent Labs  07/15/16 0546  WBC 8.1  RBC 3.01*  HCT 27.2*  PLT 190    Recent Labs  07/14/16 0418 07/15/16 0546  NA 141 140  K 3.8 3.8  CL 104 104  CO2 33* 32  BUN 26* 31*  CREATININE 1.49* 1.59*  GLUCOSE 115* 111*  CALCIUM 9.2 9.8    Recent Labs  07/14/16 0418 07/15/16 0546  INR 1.39 1.39    EXAM General - Patient is Alert, Appropriate and Oriented Extremity - Neurovascular intact Sensation intact distally Intact pulses distally Dorsiflexion/Plantar flexion intact No cellulitis present Compartment soft no edema  Proximal thigh soft Dressing - dressing C/D/I and scant drainage. No erythema. Motor Function - intact, moving foot and toes well on exam.   Past Medical History:  Diagnosis Date  . Anxiety   . Atrial fibrillation ([place])   . 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 CAD (coronary artery disease)   . CHF (congestive heart failure) ([place])   . Chronic back pain   . Chronic systolic heart failure (HCC) ~2009   EF under 25%  . CKD (chronic kidney disease) stage 3, GFR 30-59 ml/min 2009   stage  III  . Dyspnea    with exertion  . Fracture closed, humerus    never hadsurgery, left  . Gout, unspecified   . Hypertension   . Myocardial infarction    pt is unaware of this but doctors say he has  . Other left bundle branch block   . Thrombocytopenia (HCC)     Assessment/Plan:   8 Days Post-Op Procedure(s) (LRB): INTRAMEDULLARY (IM) NAIL INTERTROCHANTRIC (Left) Active Problems:   Hip fracture (HCC)  Estimated body mass index is 36.69 kg/m as calculated from the following:   Height as of this encounter: 5\' 10"  (1.778 m).   Weight as of this encounter: 116 kg (255 lb 11.7 oz). Advance diet Up with therapy  The patient has had a bowel movement.  Acute post op blood loss anemia With hemoglobin at 9.1. Discharge to SNF when he is medical clearance Remove staples and apply steri strips on 07/21/16 Follow up with [facility] in 6 weeks   DVT Prophylaxis - Lovenox, Foot Pumps and TED hose Weight-Bearing as tolerated to left leg   Reche Dixon, PA-C [place] 07/15/2016, 7:57 AM

## 2016-07-15 NOTE — Progress Notes (Signed)
[name] [name] NAME: Micheal [name]    MR#:  [id number]  DATE OF BIRTH:  Dec 08, 1940  SUBJECTIVE:  CHIEF COMPLAINT:   Chief Complaint  Patient presents with  . Fall  . Leg Pain     Came with a fall and hip fracture.   Also have A fib on coumadin , and CKD.    had hypotension after surgery, now stable.    calm and alert this morning, more oriented. Tolerating diet .   Nephro decided to start hemodialysis, received 3 HD sessions.  Weakness. REVIEW OF SYSTEMS:   Constitutional: Positive for malaise/fatigue. Negative for chills, fever and weight loss.  HENT: Negative for ear discharge, ear pain, hearing loss and nosebleeds.   Eyes: Negative for blurred vision, double vision and photophobia.  Respiratory: Negative for cough, hemoptysis, shortness of breath and wheezing.   Cardiovascular: Negative for chest pain, palpitations and orthopnea.  Gastrointestinal: Negative for abdominal pain, constipation, diarrhea, heartburn, melena, nausea and vomiting.  Genitourinary: Negative for dysuria and urgency.  Musculoskeletal: Negative for back pain and neck pain.  Skin: Negative for rash.  Neurological: Negative for dizziness, tingling, sensory change, speech change, focal weakness and headaches.  Endo/Heme/Allergies: Does not bruise/bleed easily.  Psychiatric/Behavioral: Negative for depression.   ROS  DRUG ALLERGIES:  No Known Allergies  VITALS:  Blood pressure 108/60, pulse 73, temperature 99.2 F (37.3 C), temperature source Oral, resp. rate 17, height 5\' 10"  (1.778 m), weight 255 lb 11.7 oz (116 kg), SpO2 100 %.  PHYSICAL EXAMINATION:   GENERAL:  75 y.o.-year-old patient lying in the bed with no acute distress. Dissheleved appearing. EYES: Pupils equal, round, reactive to light and accommodation. No scleral icterus. Extraocular muscles intact.  HEENT: Head atraumatic, normocephalic. Oropharynx and nasopharynx clear.  NECK:  Supple,  no jugular venous distention. No thyroid enlargement, no tenderness.  LUNGS: Normal breath sounds bilaterally, no wheezing, rales,rhonchi or crepitation. No use of accessory muscles of respiration. Decreased bibasilar breath sounds. Some coarse coughing during my exam. CARDIOVASCULAR: S1, S2 normal. No  rubs, or gallops. 3/6 systolic murmur in place. ABDOMEN: Soft, nontender, nondistended. Bowel sounds present. No organomegaly or mass.  EXTREMITIES: Left thigh have dressing. Intact neurovascular bundle. No cyanosis, or clubbing. 2+ lower extremity edema with chronic pigmentation changes. Right groin hemodialysis catheter present. NEUROLOGIC: Cranial nerves II through XII are intact. Muscle strength 2-3/5 in all extremities. Sensation intact. Gait not checked.  PSYCHIATRIC: The patient is alert and oriented X3.  SKIN: No obvious rash, lesion, or ulcer.   Physical Exam LABORATORY PANEL:   CBC  Recent Labs Lab 07/15/16 0546  WBC 8.1  HGB 9.1*  HCT 27.2*  PLT 190   ------------------------------------------------------------------------------------------------------------------  Chemistries   Recent Labs Lab 07/12/16 0408  07/15/16 0546  NA 140  < > 140  K 3.6  < > 3.8  CL 104  < > 104  CO2 29  < > 32  GLUCOSE 107*  < > 111*  BUN 68*  < > 31*  CREATININE 2.48*  < > 1.59*  CALCIUM 9.6  < > 9.8  MG 2.9*  --   --   < > = values in this interval not displayed. ------------------------------------------------------------------------------------------------------------------  Cardiac Enzymes No results for input(s): TROPONINI in the last 168 hours. ------------------------------------------------------------------------------------------------------------------  RADIOLOGY:  No results found.  ASSESSMENT AND PLAN:   Active Problems:   Hip fracture ([place])  #1 Left Hip fracture- secondary to fall.  S/p  surgery on 07/07/16. Pain control, DVT prophylaxis with lovenox, resumed  coumadin, INR is subtherapeutic. PT: SNF placement.  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 Lasix and metolazone were hold. Per nephrologist, started hemodialysis on 07/11/16, 3 times so far, Cr. Is better, no HD this weekend, f/u renal function to see if need for more HD.  #3 Ac on Chronic systolic CHF- well compensated, EF is 25%   Chronic lower extremity edema. Lasix and metolazone were hold.   No lasix for now as renal func worsening/  Was  On Gentle IV hydration to help renal func.   nephro started on HD.  #4 Afib- rate controlled, on coreg-  Warfarin was hold, s/p pacemaker  Hold COREG due to borderline BP.  Now on therapeutic lovenox.  re-started coumadin. Pharmacy to help dosing.  #5 Gout-continue home medications  #6 BPH- on flomax  #7 DVT Prophylaxis-on lovenox, resumed coumadin.  #8 meningioma   incindental finding.  # 9 hematuria   and UTI   Ur cx- morgenella, sensitive to Rocephin ( started 07/06/16- stopped on 07/12/16.)   Monitor CBC. Hb stable.  # Acute post op anemia due to blood loss   Oral iron.  # acute metabolic encephalopathy due to uremia   HD started by nephrology. Improved.  All the records are reviewed and case discussed with Care Management/Social Workerr. Management plans discussed with the patient, family and they are in agreement.  CODE STATUS: Full  TOTAL TIME TAKING CARE OF THIS PATIENT: 36 minutes.   POSSIBLE D/C IN 2-3 DAYS, DEPENDING ON CLINICAL CONDITION. Discussed with daughter in room and nephrologist.  Micheal [name] M.D on 07/15/2016   Between 7am to 6pm - Pager - [phone]  After 6pm go to [email or web address] - password [email or web address] [name]  Office  [phone]  CC: Primary care physician; Micheal Simpler, MD  Note: This dictation was prepared with Dragon dictation along with smaller phrase technology. Any transcriptional errors that result from this process are unintentional.

## 2016-07-15 NOTE — Progress Notes (Signed)
Shift assessment completed at 0800. Pt asleep at this time, woke briefly. O2 on at [facility], lungs are decreased to bilat bases. Teleboox in place, paced rhythm noted. Abdomen is soft, bs heard. Dialysis catheter is intact to R groin with site free of redness and swelling, no drainage, dressing is dry and intact. PIV #20 intact to R arm with iv ns infusing at 45mls/hr, site is free of redness and swelling.#22 intact to l wrist and saline locked. PPP, dressing to L hip is dry and intact. Pt has mepitel dressing intact to r lateral lower shin, l shin has some skin peeling. Call bell in reach.

## 2016-07-15 NOTE — Consult Note (Addendum)
ANTICOAGULATION CONSULT NOTE - Initial Consult  Pharmacy Consult for warfarin Indication: atrial fibrillation  No Known Allergies  Patient Measurements: Height: 5\' 10"  (177.8 cm) Weight: 255 lb 11.7 oz (116 kg) IBW/kg (Calculated) : 73 Heparin Dosing Weight:   Vital Signs: Temp: 98.9 F (37.2 C) (10/21 0725) Temp Source: Oral (10/21 0725) BP: 126/58 (10/21 0725) Pulse Rate: 75 (10/21 0725)  Labs:  Recent Labs  07/13/16 0609 07/14/16 0418 07/15/16 0546  HGB  --   --  9.1*  HCT  --   --  27.2*  PLT  --   --  190  LABPROT 16.4* 17.2* 17.2*  INR 1.31 1.39 1.39  CREATININE  --  1.49* 1.59*    Estimated Creatinine Clearance: 52.8 mL/min (by C-G formula based on SCr of 1.59 mg/dL (H)).   Medical History: Past Medical History:  Diagnosis Date  . Anxiety   . Atrial fibrillation ([place])   . 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 CAD (coronary artery disease)   . CHF (congestive heart failure) ([place])   . Chronic back pain   . Chronic systolic heart failure (HCC) ~2009   EF under 25%  . CKD (chronic kidney disease) stage 3, GFR 30-59 ml/min 2009   stage III  . Dyspnea    with exertion  . Fracture closed, humerus    never hadsurgery, left  . Gout, unspecified   . Hypertension   . Myocardial infarction    pt is unaware of this but doctors say he has  . Other left bundle branch block   . Thrombocytopenia (HCC)     Medications:  Scheduled:  . ALPRAZolam  0.5 mg Oral TID  . calcitRIOL  0.25 mcg Oral Daily  . calcium-vitamin D  1 tablet Oral BID  . Chlorhexidine Gluconate Cloth  6 each Topical Q0600  . citalopram  40 mg Oral Daily  . enoxaparin (LOVENOX) injection  1 mg/kg Subcutaneous Q24H  . febuxostat  40 mg Oral QHS  . feeding supplement (NEPRO CARB STEADY)  237 mL Oral BID BM  . ferrous sulfate  325 mg Oral BID WC  . mupirocin ointment  1 application Nasal BID  . pravastatin  80 mg Oral q1800  . tamsulosin  0.4 mg Oral Daily  .  Warfarin - Pharmacist Dosing Inpatient   Does not apply q1800    Assessment: Pt is a 75 year old male w/ a PMH of Afib on warfarin at home. Pt is s/p left hip surgery. INR is subtherapeutic at 1.5. Pt received a total of 10mg  vit K (5mg  po, 5mg  Iv). Pharmacy consulted to restart warfarin. Patient on Lovenox bridge.  Pt home dose is 5mg  Sun,MWF and 2.5 T,T,sat. Pt last warfarin dose was 10/10  Date  INR  Warfarin Dose 10/11  2.01  0, vit K 5mg  PO 10/12  2.01  0, vit K 5mg  IV 10/13  1.79  none 10/14  1.50   5 mg 10/15  1.44  5 mg 10/16  1.40                 5 mg 10/17            1.36  Not given 10/18  1.42  6mg  10/19  1.31  6mg  10/20  1.39                 7.5 mg  10/21  1.39                 7.5 mg    Goal of Therapy:  INR 2-3  Monitor platelets by anticoagulation protocol: Yes   Plan:  INR slow to increase, effects of vit K are likely gone by now. Patient however missed dose the evening of 10/17. Will give warfarin 7.5 mg PO x 1 again today. Will recheck PT/INR with am labs.  Continue lovenox brdige 1mg /kg q 12 hrs. Will need to follow renal function closely. If renal function diminishes then will need to speak with MD about starting therapeutic heparin.   Horice Carrero D 07/15/2016,10:54 AM

## 2016-07-15 NOTE — Progress Notes (Signed)
Subjective:  Resting quietly Denies acute c/o Appetite is poor S Cr 1.59/BUN 31 . Last HD 10/19 UOP 550 cc. Uses a urinal  Objective:  Vital signs in last 24 hours:  Temp:  [98.5 F (36.9 C)-99.2 F (37.3 C)] 99.2 F (37.3 C) (10/21 1117) Pulse Rate:  [73-75] 73 (10/21 1117) Resp:  [2-20] 17 (10/21 1117) BP: (108-132)/(57-69) 108/60 (10/21 1117) SpO2:  [99 %-100 %] 100 % (10/21 1117) Weight:  [116 kg (255 lb 11.7 oz)] 116 kg (255 lb 11.7 oz) (10/21 0412)  Weight change: -0.4 kg (-14.1 oz) Filed Weights   07/13/16 0925 07/13/16 1237 07/15/16 0412  Weight: 116.4 kg (256 lb 9.9 oz) 116.4 kg (256 lb 9.9 oz) 116 kg (255 lb 11.7 oz)    Intake/Output:    Intake/Output Summary (Last 24 hours) at 07/15/16 1301 Last data filed at 07/15/16 1108  Gross per 24 hour  Intake             1328 ml  Output              750 ml  Net              578 ml     Physical Exam: General: Chronically ill-appearing, laying in bed  HEENT Anicteric, moist mucous membranes  Neck supple  Pulm/lungs Normal breathing effort, decreased breath sounds at bases  CVS/Heart no rub or gallop, paced rhythm  Abdomen:  Soft, nontender, nondistended  Extremities: 1+ dependent pitting edema bilaterally  Neurologic: Resting quietly  Skin: No rashes  Access: Right femoral dialysis catheter       Basic Metabolic Panel:   Recent Labs Lab 07/10/16 0349 07/11/16 0354 07/11/16 1800 07/12/16 0408 07/13/16 1014 07/14/16 0418 07/15/16 0546  NA 136 137  --  140  --  141 140  K 3.7 3.8  --  3.6  --  3.8 3.8  CL 100* 101  --  104  --  104 104  CO2 29 27  --  29  --  33* 32  GLUCOSE 116* 113*  --  107*  --  115* 111*  BUN 99* 95*  --  68*  --  26* 31*  CREATININE 3.63* 3.37*  --  2.48*  --  1.49* 1.59*  CALCIUM 8.8* 9.4  --  9.6  --  9.2 9.8  MG  --   --   --  2.9*  --   --   --   PHOS  --   --  3.4 2.8 1.9*  --  2.7     CBC:  Recent Labs Lab 07/10/16 0349 07/11/16 0354 07/15/16 0546  WBC 7.0  7.6 8.1  HGB 8.6* 8.4* 9.1*  HCT 25.7* 24.6* 27.2*  MCV 90.5 88.7 90.2  PLT 124* 146* 190      Microbiology:  Recent Results (from the past 720 hour(s))  Urine culture     Status: Abnormal   Collection Time: 07/05/16  7:59 AM  Result Value Ref Range Status   Specimen Description URINE, RANDOM  Final   Special Requests NONE  Final   Culture >=100,000 COLONIES/mL MORGANELLA MORGANII (A)  Final   Report Status 07/08/2016 FINAL  Final   Organism ID, Bacteria MORGANELLA MORGANII (A)  Final      Susceptibility   Morganella morganii - MIC*    AMPICILLIN >=32 RESISTANT Resistant     CEFAZOLIN >=64 RESISTANT Resistant     CEFTRIAXONE <=1 SENSITIVE Sensitive     CIPROFLOXACIN <=  0.25 SENSITIVE Sensitive     GENTAMICIN <=1 SENSITIVE Sensitive     IMIPENEM 4 SENSITIVE Sensitive     NITROFURANTOIN 64 RESISTANT Resistant     TRIMETH/SULFA <=20 SENSITIVE Sensitive     AMPICILLIN/SULBACTAM >=32 RESISTANT Resistant     PIP/TAZO <=4 SENSITIVE Sensitive     * >=100,000 COLONIES/mL MORGANELLA MORGANII  Surgical PCR screen     Status: Abnormal   Collection Time: 07/05/16  1:18 PM  Result Value Ref Range Status   MRSA, PCR POSITIVE (A) NEGATIVE Final    Comment: RESULT CALLED TO, READ BACK BY AND VERIFIED WITH: ERIN CARTER ON 07/05/16 AT 1553 [place]    Staphylococcus aureus POSITIVE (A) NEGATIVE Final    Comment:        The Xpert SA Assay (FDA approved for NASAL specimens in patients over 79 years of age), is one component of a comprehensive surveillance program.  Test performance has been validated by [facility] for patients greater than or equal to 78 year old. It is not intended to diagnose infection nor to guide or monitor treatment.     Coagulation Studies:  Recent Labs  07/13/16 0609 07/14/16 0418 07/15/16 0546  LABPROT 16.4* 17.2* 17.2*  INR 1.31 1.39 1.39    Urinalysis: No results for input(s): COLORURINE, LABSPEC, PHURINE, GLUCOSEU, HGBUR, BILIRUBINUR, KETONESUR,  PROTEINUR, UROBILINOGEN, NITRITE, LEUKOCYTESUR in the last 72 hours.  Invalid input(s): APPERANCEUR    Imaging: Dg Chest 1 View  Result Date: 07/13/2016 CLINICAL DATA:  Hypoxia. History of atrial fibrillation, CAD, CHF, hypertension, MI, ICD. History of smoking. EXAM: CHEST 1 VIEW COMPARISON:  07/07/2016 FINDINGS: Heart is enlarged. patient's left-sided AICD with leads to the right atrium, right ventricle, and coronary sinus. There is perihilar pulmonary edema which is increased over prior study. No focal consolidations or definite pleural effusions. IMPRESSION: Cardiomegaly.  Increased perihilar edema. Electronically Signed   By: Nolon Nations M.D.   On: 07/13/2016 15:13     Medications:     . ALPRAZolam  0.5 mg Oral BID  . calcitRIOL  0.25 mcg Oral Daily  . calcium-vitamin D  1 tablet Oral BID  . Chlorhexidine Gluconate Cloth  6 each Topical Q0600  . citalopram  40 mg Oral Daily  . enoxaparin (LOVENOX) injection  1 mg/kg Subcutaneous Q12H  . febuxostat  40 mg Oral QHS  . feeding supplement (NEPRO CARB STEADY)  237 mL Oral BID BM  . ferrous sulfate  325 mg Oral BID WC  . mupirocin ointment  1 application Nasal BID  . pravastatin  80 mg Oral q1800  . tamsulosin  0.4 mg Oral Daily  . warfarin  7.5 mg Oral ONCE-1800  . Warfarin - Pharmacist Dosing Inpatient   Does not apply q1800   acetaminophen **OR** acetaminophen, alum & mag hydroxide-simeth, cyclobenzaprine, guaiFENesin-dextromethorphan, HYDROcodone-acetaminophen, HYDROmorphone (DILAUDID) injection, ipratropium-albuterol, magnesium citrate, magnesium hydroxide, menthol-cetylpyridinium **OR** phenol, metoCLOPramide **OR** metoCLOPramide (REGLAN) injection, ondansetron **OR** ondansetron (ZOFRAN) IV, polyethylene glycol, senna-docusate  Assessment/ Plan:  75 y.o.caucasian male with hypertension, morbid obesity, gout, history of nonsteroidal use, chronic atrial fibrillation, hyperlipidemia,diabetes, depression, ICD, congestive  heart failure with LVEF 25%, chronic kidney disease now presents with comminuted intertrochanteric fracture of the left femur  1.  Acute renal failure on chronic kidney disease stage IV. baseline creatinine is 2.25/GFR 28 based on labs from July 25 Cause of ARF not entirely clear but likely secondary to volume depletion/UTI.  - The patient's dialysis catheter was replaced .  His last HD was Thursday  10/19 Patient did not want long-term dialysis. We were treating the acute component of renal failure. Mental status has improved Electrolytes and volume status are acceptable.  No acute indication for dialysis today.  Labs over the weekend.  Continue to monitor electrolytes on a daily basis.   2.  Anemia chronic kidney disease.  Hemoglobin 9.1. Recommend continued monitoring. If the patient recovers from hip fracture we could potentially consider Epogen.  3.  Lower extremity edema -  Appears to be stable at the moment. Patient off diuretic therapy.    LOS: 10 Micheal [name] 10/21/20171:01 PM

## 2016-07-15 NOTE — Progress Notes (Signed)
Physical Therapy Treatment Patient Details Name: Micheal [name] MRN: [id number] DOB: 11/28/1940 Today's Date: 07/15/2016    History of Present Illness Micheal [name] is a 75yo male who comes to [facility] after sustaining a fall at home with Left hip fracture. Pt was held for a few days until medically stable/cleared for surgery by cardiology, nephrology, and underwent Left hip ORIF/IM nailing on 10/13. Baseline level of funcitonal includes household AMB without AD, min-monA for bathing, and total assist for IADL. Pt does not tolerate long AMB distances. PMH: afib, PPM, CHF, EF: 25%, CKD-4. on POD he transferd from CCU to 1A    PT Comments    Patient asleep at PT arrival, demonstrating intermittently difficult to understand speech when asked multistep questions. Tolerated exercises well with verbal cues used for breathing. All mobility deferred at this session due to R LE femoral catheter. Plan to continue mobility progressions when medically appropriate.  Follow Up Recommendations  SNF     Equipment Recommendations  Rolling walker with 5" wheels    Recommendations for Other Services       Precautions / Restrictions Precautions Precautions: Fall Restrictions Weight Bearing Restrictions: Yes LLE Weight Bearing: Weight bearing as tolerated Other Position/Activity Restrictions: Remote Left UE fracture, non-surgical, with chronic functional hypofunction.     Mobility  Bed Mobility               General bed mobility comments: Mobility deferred due to R LE femoral catheter  Transfers                 General transfer comment: Transfers deferred due to R LE femoral catheter  Ambulation/Gait                 Stairs            Wheelchair Mobility    Modified Rankin (Stroke Patients Only)       Balance                                    Cognition Arousal/Alertness: Lethargic Behavior During Therapy: Flat affect;WFL for tasks  assessed/performed Overall Cognitive Status: Impaired/Different from baseline Area of Impairment: Attention   Current Attention Level: Alternating           General Comments: Patient's speech intermittently difficult to understand when asked multistep questions.    Exercises General Exercises - Lower Extremity Ankle Circles/Pumps: AROM;20 reps Quad Sets: 15 reps;Strengthening Gluteal Sets: 15 reps;Strengthening Short Arc Quad: 10 reps;Strengthening Heel Slides: AAROM;10 reps Hip ABduction/ADduction: AAROM;10 reps Straight Leg Raises: AAROM;10 reps    General Comments        Pertinent Vitals/Pain Pain Assessment: Faces Faces Pain Scale: Hurts a little bit Pain Location: L hip Pain Intervention(s): Limited activity within patient's tolerance;Monitored during session    Home Living                      Prior Function            PT Goals (current goals can now be found in the care plan section) Acute Rehab PT Goals Patient Stated Goal: To return home to PLOF. PT Goal Formulation: With patient Time For Goal Achievement: 07/22/16 Potential to Achieve Goals: Fair Progress towards PT goals: Progressing toward goals    Frequency    BID      PT Plan Current plan remains appropriate    Co-evaluation  End of Session Equipment Utilized During Treatment: Oxygen Activity Tolerance: Patient tolerated treatment well Patient left: in bed;with call bell/phone within reach;with family/visitor present     Time: 0827-0855 PT Time Calculation (min) (ACUTE ONLY): 28 min  Charges:  $Therapeutic Exercise: 23-37 mins                    G Codes:      Dorice Lamas, PT, DPT 07/15/2016, 10:21 AM

## 2016-07-15 NOTE — Progress Notes (Signed)
Physical Therapy Treatment Patient Details Name: Micheal [name] MRN: [id number] DOB: March 11, 1941 Today's Date: 07/15/2016    History of Present Illness Euell Santorelli is a 75yo male who comes to [facility] after sustaining a fall at home with Left hip fracture. Pt was held for a few days until medically stable/cleared for surgery by cardiology, nephrology, and underwent Left hip ORIF/IM nailing on 10/13. Baseline level of funcitonal includes household AMB without AD, min-monA for bathing, and total assist for IADL. Pt does not tolerate long AMB distances. PMH: afib, PPM, CHF, EF: 25%, CKD-4. on POD he transferd from CCU to 1A    PT Comments    Patient more alert during this afternoon's session, demonstrating improved ability to participate in therapeutic exercises. Required minimal assistance with some exercises but was demonstrating quality effort throughout session. Will benefit from progressive functional strengthening when medically appropriate. Current d/c plan remains appropriate.  Follow Up Recommendations  SNF     Equipment Recommendations  Rolling walker with 5" wheels    Recommendations for Other Services       Precautions / Restrictions Precautions Precautions: Fall Restrictions Weight Bearing Restrictions: Yes LLE Weight Bearing: Weight bearing as tolerated Other Position/Activity Restrictions: Remote Left UE fracture, non-surgical, with chronic functional hypofunction.     Mobility  Bed Mobility               General bed mobility comments: Mobility deferred due to R LE femoral catheter  Transfers                 General transfer comment: Transfers deferred due to R LE femoral catheter  Ambulation/Gait                 Stairs            Wheelchair Mobility    Modified Rankin (Stroke Patients Only)       Balance                                    Cognition Arousal/Alertness: Awake/alert Behavior During Therapy: WFL  for tasks assessed/performed Overall Cognitive Status: Within Functional Limits for tasks assessed                      Exercises General Exercises - Lower Extremity Ankle Circles/Pumps: AROM;20 reps Quad Sets: Strengthening;20 reps Gluteal Sets: Strengthening;20 reps Short Arc Quad: 15 reps;Strengthening Heel Slides: Left;AAROM;15 reps Hip ABduction/ADduction: Strengthening;AAROM;Left;15 reps    General Comments        Pertinent Vitals/Pain Pain Assessment: Faces Faces Pain Scale: Hurts a little bit Pain Location: L hip Pain Intervention(s): Limited activity within patient's tolerance;Monitored during session    Home Living                      Prior Function            PT Goals (current goals can now be found in the care plan section) Acute Rehab PT Goals Patient Stated Goal: To return home to PLOF. PT Goal Formulation: With patient Time For Goal Achievement: 07/22/16 Potential to Achieve Goals: Fair Progress towards PT goals: Progressing toward goals    Frequency    BID      PT Plan Current plan remains appropriate    Co-evaluation             End of Session     Patient left:  in bed;with call bell/phone within reach;with bed alarm set;with family/visitor present     Time: [id number] PT Time Calculation (min) (ACUTE ONLY): 24 min  Charges:  $Therapeutic Exercise: 23-37 mins                    G Codes:      Dorice Lamas, PT, DPT 07/15/2016, 2:07 PM

## 2016-07-16 LAB — PROTIME-INR
INR: 1.56
Prothrombin Time: 18.8 seconds — ABNORMAL HIGH (ref 11.4–15.2)

## 2016-07-16 LAB — CREATININE, SERUM
CREATININE: 1.62 mg/dL — AB (ref 0.61–1.24)
GFR calc non Af Amer: 40 mL/min — ABNORMAL LOW (ref >60)
GFR, EST AFRICAN AMERICAN: 47 mL/min — AB (ref >60)

## 2016-07-16 MED ORDER — WARFARIN SODIUM 5 MG PO TABS
7.5000 mg | ORAL_TABLET | Freq: Once | ORAL | Status: AC
  Administered 2016-07-16: 7.5 mg via ORAL
  Filled 2016-07-16: qty 1

## 2016-07-16 NOTE — Progress Notes (Signed)
Physical Therapy Treatment Patient Details Name: Micheal [name] MRN: [id number] DOB: 23-Mar-1941 Today's Date: 07/16/2016    History of Present Illness 75yo male who comes to [facility] after sustaining a fall at home with Left hip fracture. Pt was held for a few days until medically stable/cleared for surgery by cardiology, nephrology, and underwent Left hip ORIF/IM nailing on 10/13. Baseline level of funcitonal includes household AMB without AD, min-monA for bathing, and total assist for IADL. Pt does not tolerate long AMB distances. PMH: afib, PPM, CHF, EF: 25%, CKD-4. on POD he transferd from CCU to 1A    PT Comments    Pt not needing nearly as many rest breaks as last session with this PT (2 days ago) but is still weak and relatively limited.  He shows good effort with what he is able to do but hip flexion and other activities are still largely not able to go against much resistance and often needs AAROM and increased encouragement.   Follow Up Recommendations  SNF     Equipment Recommendations       Recommendations for Other Services       Precautions / Restrictions Precautions Precautions: Fall (R temporary femoral catheter for HD) Restrictions LLE Weight Bearing: Weight bearing as tolerated    Mobility  Bed Mobility               General bed mobility comments: Mobility deferred due to R LE femoral catheter  Transfers                    Ambulation/Gait                 Stairs            Wheelchair Mobility    Modified Rankin (Stroke Patients Only)       Balance                                    Cognition Arousal/Alertness: Awake/alert Behavior During Therapy: WFL for tasks assessed/performed Overall Cognitive Status: Within Functional Limits for tasks assessed                      Exercises General Exercises - Lower Extremity Ankle Circles/Pumps: AROM;20 reps Quad Sets: Strengthening;20 reps Gluteal Sets:  Strengthening;20 reps Short Arc Quad: 15 reps;Strengthening Heel Slides: AAROM;15 reps Hip ABduction/ADduction: Strengthening;15 reps    General Comments        Pertinent Vitals/Pain Pain Assessment: 0-10 Pain Score: 4     Home Living                      Prior Function            PT Goals (current goals can now be found in the care plan section) Progress towards PT goals: Progressing toward goals    Frequency    BID      PT Plan Current plan remains appropriate    Co-evaluation             End of Session Equipment Utilized During Treatment: Oxygen Activity Tolerance: Patient tolerated treatment well Patient left: in bed;with call bell/phone within reach;with bed alarm set;with family/visitor present     Time: [id number] PT Time Calculation (min) (ACUTE ONLY): 16 min  Charges:  $Therapeutic Exercise: 8-22 mins  G Codes:      Kreg Shropshire, DPT 07/16/2016, 12:10 PM

## 2016-07-16 NOTE — Progress Notes (Addendum)
Subjective: 9 Days Post-Op Procedure(s) (LRB): INTRAMEDULLARY (IM) NAIL INTERTROCHANTRIC (Left) Patient reports pain as mild with rest. He is awake and alert this morning and doing much better. Patient is well, and has had no acute complaints or problems Denies any CP, SOB, ABD pain. Plan is to go Skilled nursing facility after hospital stay.  Objective: Vital signs in last 24 hours: Temp:  [98.2 F (36.8 C)-99.2 F (37.3 C)] 98.2 F (36.8 C) (10/22 0414) Pulse Rate:  [73-75] 74 (10/22 0414) Resp:  [16-20] 18 (10/22 0414) BP: (108-134)/(58-71) 134/68 (10/22 0414) SpO2:  [96 %-100 %] 96 % (10/22 0414)  Intake/Output from previous day: 10/21 0701 - 10/22 0700 In: 280 [P.O.:120; I.V.:160] Out: 650 [Urine:650] Intake/Output this shift: Total I/O In: -  Out: 225 [Urine:225]   Recent Labs  07/15/16 0546  HGB 9.1*    Recent Labs  07/15/16 0546  WBC 8.1  RBC 3.01*  HCT 27.2*  PLT 190    Recent Labs  07/14/16 0418 07/15/16 0546 07/16/16 0446  NA 141 140  --   K 3.8 3.8  --   CL 104 104  --   CO2 33* 32  --   BUN 26* 31*  --   CREATININE 1.49* 1.59* 1.62*  GLUCOSE 115* 111*  --   CALCIUM 9.2 9.8  --     Recent Labs  07/15/16 0546 07/16/16 0446  INR 1.39 1.56    EXAM General - Patient is Alert, Appropriate and Oriented Extremity - Neurovascular intact Sensation intact distally Intact pulses distally Dorsiflexion/Plantar flexion intact No cellulitis present Compartment soft no edema  Proximal thigh softWith no edema. Dressing - dressing C/D/I and scant drainage. No erythema. Motor Function - intact, moving foot and toes well on exam.   Past Medical History:  Diagnosis Date  . Anxiety   . Atrial fibrillation ([place])   . 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 CAD (coronary artery disease)   . CHF (congestive heart failure) ([place])   . Chronic back pain   . Chronic systolic heart failure (HCC) ~2009   EF under 25%   . CKD (chronic kidney disease) stage 3, GFR 30-59 ml/min 2009   stage III  . Dyspnea    with exertion  . Fracture closed, humerus    never hadsurgery, left  . Gout, unspecified   . Hypertension   . Myocardial infarction    pt is unaware of this but doctors say he has  . Other left bundle branch block   . Thrombocytopenia (HCC)     Assessment/Plan:   9 Days Post-Op Procedure(s) (LRB): INTRAMEDULLARY (IM) NAIL INTERTROCHANTRIC (Left) Active Problems:   Hip fracture (HCC)  Estimated body mass index is 36.69 kg/m as calculated from the following:   Height as of this encounter: 5\' 10"  (1.778 m).   Weight as of this encounter: 116 kg (255 lb 11.7 oz). Advance diet Up with therapy  The patient has had a bowel movement.  Acute post op blood loss anemia With hemoglobin at 9.1. Discharge to SNF when he is medical clearance Remove staples and apply steri strips on 07/21/16 Follow up with [place] [facility] in 6 weeks   DVT Prophylaxis - Lovenox, Foot Pumps and TED hose Weight-Bearing as tolerated to left leg   Reche Dixon, PA-C [place] 07/16/2016, 6:50 AM   Addendum: Discharge planning is apparently dependent upon the possible need for hemodialysis after he leaves the hospital. (Initial SND  bed offer was for [facility], but that facility is unable to accommodate hemodialysis.)physical therapy Physical therapy has apparently been limited to therapeutic exercises due to the right femoral catheter.  James P. Holley Bouche M.D.

## 2016-07-16 NOTE — Progress Notes (Signed)
Subjective:  Resting quietly; daughter in the room today Denies acute c/o Appetite is poor but able to drink NEPRO supplements S Cr 1.62/GFR 40 BUN 31 yesterday . Last HD 10/19 UOP 650 cc. Uses a urinal; c/o delayed response from staff for assistance with bathroom  Objective:  Vital signs in last 24 hours:  Temp:  [98.2 F (36.8 C)-98.7 F (37.1 C)] 98.4 F (36.9 C) (10/22 1126) Pulse Rate:  [73-75] 74 (10/22 1126) Resp:  [16-20] 17 (10/22 1126) BP: (114-134)/(59-71) 114/59 (10/22 1126) SpO2:  [96 %-100 %] 99 % (10/22 1126)  Weight change:  Filed Weights   07/13/16 0925 07/13/16 1237 07/15/16 0412  Weight: 116.4 kg (256 lb 9.9 oz) 116.4 kg (256 lb 9.9 oz) 116 kg (255 lb 11.7 oz)    Intake/Output:    Intake/Output Summary (Last 24 hours) at 07/16/16 1127 Last data filed at 07/16/16 0414  Gross per 24 hour  Intake                0 ml  Output              450 ml  Net             -450 ml     Physical Exam: General: Chronically ill-appearing, laying in bed  HEENT Anicteric, moist mucous membranes  Neck supple  Pulm/lungs Normal breathing effort, decreased breath sounds at bases  CVS/Heart no rub or gallop, paced rhythm  Abdomen:  Soft, nontender, nondistended  Extremities: 1+ dependent pitting edema bilaterally-thighs and lower abdomen  Neurologic: Resting quietly  Skin: No rashes  Access: Right femoral dialysis catheter       Basic Metabolic Panel:   Recent Labs Lab 07/10/16 0349 07/11/16 0354 07/11/16 1800 07/12/16 0408 07/13/16 1014 07/14/16 0418 07/15/16 0546 07/16/16 0446  NA 136 137  --  140  --  141 140  --   K 3.7 3.8  --  3.6  --  3.8 3.8  --   CL 100* 101  --  104  --  104 104  --   CO2 29 27  --  29  --  33* 32  --   GLUCOSE 116* 113*  --  107*  --  115* 111*  --   BUN 99* 95*  --  68*  --  26* 31*  --   CREATININE 3.63* 3.37*  --  2.48*  --  1.49* 1.59* 1.62*  CALCIUM 8.8* 9.4  --  9.6  --  9.2 9.8  --   MG  --   --   --  2.9*  --   --    --   --   PHOS  --   --  3.4 2.8 1.9*  --  2.7  --      CBC:  Recent Labs Lab 07/10/16 0349 07/11/16 0354 07/15/16 0546  WBC 7.0 7.6 8.1  HGB 8.6* 8.4* 9.1*  HCT 25.7* 24.6* 27.2*  MCV 90.5 88.7 90.2  PLT 124* 146* 190      Microbiology:  Recent Results (from the past 720 hour(s))  Urine culture     Status: Abnormal   Collection Time: 07/05/16  7:59 AM  Result Value Ref Range Status   Specimen Description URINE, RANDOM  Final   Special Requests NONE  Final   Culture >=100,000 COLONIES/mL MORGANELLA MORGANII (A)  Final   Report Status 07/08/2016 FINAL  Final   Organism ID, Bacteria MORGANELLA MORGANII (A)  Final  Susceptibility   Morganella morganii - MIC*    AMPICILLIN >=32 RESISTANT Resistant     CEFAZOLIN >=64 RESISTANT Resistant     CEFTRIAXONE <=1 SENSITIVE Sensitive     CIPROFLOXACIN <=0.25 SENSITIVE Sensitive     GENTAMICIN <=1 SENSITIVE Sensitive     IMIPENEM 4 SENSITIVE Sensitive     NITROFURANTOIN 64 RESISTANT Resistant     TRIMETH/SULFA <=20 SENSITIVE Sensitive     AMPICILLIN/SULBACTAM >=32 RESISTANT Resistant     PIP/TAZO <=4 SENSITIVE Sensitive     * >=100,000 COLONIES/mL MORGANELLA MORGANII  Surgical PCR screen     Status: Abnormal   Collection Time: 07/05/16  1:18 PM  Result Value Ref Range Status   MRSA, PCR POSITIVE (A) NEGATIVE Final    Comment: RESULT CALLED TO, READ BACK BY AND VERIFIED WITH: ERIN CARTER ON 07/05/16 AT 1553 [place]    Staphylococcus aureus POSITIVE (A) NEGATIVE Final    Comment:        The Xpert SA Assay (FDA approved for NASAL specimens in patients over 73 years of age), is one component of a comprehensive surveillance program.  Test performance has been validated by [facility] for patients greater than or equal to 20 year old. It is not intended to diagnose infection nor to guide or monitor treatment.     Coagulation Studies:  Recent Labs  07/14/16 0418 07/15/16 0546 07/16/16 0446  LABPROT 17.2* 17.2*  18.8*  INR 1.39 1.39 1.56    Urinalysis: No results for input(s): COLORURINE, LABSPEC, PHURINE, GLUCOSEU, HGBUR, BILIRUBINUR, KETONESUR, PROTEINUR, UROBILINOGEN, NITRITE, LEUKOCYTESUR in the last 72 hours.  Invalid input(s): APPERANCEUR    Imaging: No results found.   Medications:     . ALPRAZolam  0.5 mg Oral TID  . calcitRIOL  0.25 mcg Oral Daily  . calcium-vitamin D  1 tablet Oral BID  . Chlorhexidine Gluconate Cloth  6 each Topical Q0600  . citalopram  40 mg Oral Daily  . enoxaparin (LOVENOX) injection  115 mg Subcutaneous Q12H  . febuxostat  40 mg Oral QHS  . feeding supplement (NEPRO CARB STEADY)  237 mL Oral BID BM  . ferrous sulfate  325 mg Oral BID WC  . mupirocin ointment  1 application Nasal BID  . pravastatin  80 mg Oral q1800  . tamsulosin  0.4 mg Oral Daily  . warfarin  7.5 mg Oral ONCE-1800  . Warfarin - Pharmacist Dosing Inpatient   Does not apply q1800   acetaminophen **OR** acetaminophen, alum & mag hydroxide-simeth, cyclobenzaprine, guaiFENesin-dextromethorphan, HYDROcodone-acetaminophen, HYDROmorphone (DILAUDID) injection, hydrOXYzine, ipratropium-albuterol, magnesium citrate, magnesium hydroxide, menthol-cetylpyridinium **OR** phenol, metoCLOPramide **OR** metoCLOPramide (REGLAN) injection, ondansetron **OR** ondansetron (ZOFRAN) IV, polyethylene glycol, senna-docusate  Assessment/ Plan:  75 y.o.caucasian male with hypertension, morbid obesity, gout, history of nonsteroidal use, chronic atrial fibrillation, hyperlipidemia,diabetes, depression, ICD, congestive heart failure with LVEF 25%, chronic kidney disease now presents with comminuted intertrochanteric fracture of the left femur  1.  Acute renal failure on chronic kidney disease stage IV. baseline creatinine is 2.25/GFR 28 based on labs from July 25 Cause of ARF likely secondary to volume depletion/UTI.   His last HD was Thursday 10/19 Patient did not want long-term dialysis. We were treating the  acute component of renal failure. Mental status has improved Electrolytes and volume status are acceptable.  No acute indication for dialysis today.  Monitor labs and observe over the weekend.  Continue to monitor electrolytes on a daily basis. May remove dialysis cathter in next 1-2 days if no further need  for HD    2.  Anemia chronic kidney disease.  Hemoglobin 9.1. Recommend continued monitoring. If the patient recovers from hip fracture we could potentially consider Epogen.  3.  Lower extremity edema -  Appears to be stable at the moment. Patient off diuretic therapy.    LOS: 11 Aero [place]mond[place] 10/22/201711:27 AM

## 2016-07-16 NOTE — Progress Notes (Signed)
[place] [facility] [place] NAME: Micheal [name]    MR#:  [id number]  DATE OF BIRTH:  1940-12-24  SUBJECTIVE:  CHIEF COMPLAINT:   Chief Complaint  Patient presents with  . Fall  . Leg Pain     Came with a fall and hip fracture.   Also have A fib on coumadin , and CKD.    had hypotension after surgery, now stable.    calm and alert this morning, more oriented. Tolerating diet .   Nephro decided to start hemodialysis, received 3 HD sessions.  Weakness and panic attack last night.  On O2 [place] 2L. REVIEW OF SYSTEMS:   Constitutional: Positive for malaise/fatigue. Negative for chills, fever and weight loss.  HENT: Negative for ear discharge, ear pain, hearing loss and nosebleeds.   Eyes: Negative for blurred vision, double vision and photophobia.  Respiratory: Negative for cough, hemoptysis, shortness of breath and wheezing.   Cardiovascular: Negative for chest pain, palpitations and orthopnea.  Gastrointestinal: Negative for abdominal pain, constipation, diarrhea, heartburn, melena, nausea and vomiting.  Genitourinary: Negative for dysuria and urgency.  Musculoskeletal: Negative for back pain and neck pain.  Skin: Negative for rash.  Neurological: Negative for dizziness, tingling, sensory change, speech change, focal weakness and headaches.  Endo/Heme/Allergies: Does not bruise/bleed easily.  Psychiatric/Behavioral: Negative for depression. But has panic attack and anxiety.  ROS  DRUG ALLERGIES:  No Known Allergies  VITALS:  Blood pressure (!) 114/59, pulse 74, temperature 98.4 F (36.9 C), temperature source Oral, resp. rate 17, height 5\' 10"  (1.778 m), weight 255 lb 11.7 oz (116 kg), SpO2 99 %.  PHYSICAL EXAMINATION:   GENERAL:  75 y.o.-year-old patient lying in the bed with no acute distress. Dissheleved appearing. EYES: Pupils equal, round, reactive to light and accommodation. No scleral icterus. Extraocular muscles intact.  HEENT: Head  atraumatic, normocephalic. Oropharynx and nasopharynx clear.  NECK:  Supple, no jugular venous distention. No thyroid enlargement, no tenderness.  LUNGS: Normal breath sounds bilaterally, no wheezing, rales,rhonchi or crepitation. No use of accessory muscles of respiration. Decreased bibasilar breath sounds. Some coarse coughing during my exam. CARDIOVASCULAR: S1, S2 normal. No  rubs, or gallops. 3/6 systolic murmur in place. ABDOMEN: Soft, nontender, nondistended. Bowel sounds present. No organomegaly or mass.  EXTREMITIES: Left thigh have dressing. Intact neurovascular bundle. No cyanosis, or clubbing. 2+ lower extremity edema with chronic pigmentation changes. Right groin hemodialysis catheter present. NEUROLOGIC: Cranial nerves II through XII are intact. Muscle strength 2-3/5 in all extremities. Sensation intact. Gait not checked.  PSYCHIATRIC: The patient is alert and oriented X3.  SKIN: No obvious rash, lesion, or ulcer.   Physical Exam LABORATORY PANEL:   CBC  Recent Labs Lab 07/15/16 0546  WBC 8.1  HGB 9.1*  HCT 27.2*  PLT 190   ------------------------------------------------------------------------------------------------------------------  Chemistries   Recent Labs Lab 07/12/16 0408  07/15/16 0546 07/16/16 0446  NA 140  < > 140  --   K 3.6  < > 3.8  --   CL 104  < > 104  --   CO2 29  < > 32  --   GLUCOSE 107*  < > 111*  --   BUN 68*  < > 31*  --   CREATININE 2.48*  < > 1.59* 1.62*  CALCIUM 9.6  < > 9.8  --   MG 2.9*  --   --   --   < > = values in this interval not displayed. ------------------------------------------------------------------------------------------------------------------  Cardiac Enzymes No results for input(s): TROPONINI in the last 168 hours. ------------------------------------------------------------------------------------------------------------------  RADIOLOGY:  No results found.  ASSESSMENT AND PLAN:   Active Problems:   Hip  fracture ([place])  #1 Left Hip fracture- secondary to fall.  S/p surgery on 07/07/16. Pain control, DVT prophylaxis with lovenox, resumed coumadin, INR is subtherapeutic. PT: SNF placement.  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 Lasix and metolazone were hold. Per nephrologist, started hemodialysis on 07/11/16, 3 times so far, Cr. Is better and stable, no need for more HD today per Dr. Candiss [name].   #3 Ac on Chronic systolic CHF- well compensated, EF is 25%   Chronic lower extremity edema. Lasix and metolazone were hold.   No lasix for now as renal func worsening/  Was  On Gentle IV hydration to help renal func.   He was on HD.  May resume lasix 40 mg po daily tomorrow per Dr. Candiss [name].  #4 Afib- rate controlled, on coreg-  Warfarin was hold, s/p pacemaker  Hold COREG due to borderline BP.  Now on therapeutic lovenox.  re-started coumadin. Pharmacy to help dosing.  #5 Gout-continue home medications  #6 BPH- on flomax  #7 DVT Prophylaxis-on lovenox, resumed coumadin.  #8 meningioma   incindental finding.  # 9 hematuria   and UTI   Ur cx- Micheal [name], sensitive to Rocephin ( started 07/06/16- stopped on 07/12/16.)   Monitor CBC. Hb stable.  # Acute post op anemia due to blood loss   Oral iron.  # acute metabolic encephalopathy due to uremia   HD started by nephrology. Improved.   Anxiety. Continue xanax, atarax and celexa  Discussed with Dr. Candiss [name]. All the records are reviewed and case discussed with Care Management/Social Workerr. Management plans discussed with the patient, family and they are in agreement.  CODE STATUS: Full  TOTAL TIME TAKING CARE OF THIS PATIENT: 32 minutes.   POSSIBLE D/C IN 2 DAYS, DEPENDING ON CLINICAL CONDITION. Discussed with daughter in room and nephrologist.  Micheal [name] M.D on 07/16/2016   Between 7am to 6pm - Pager - [phone]  After 6pm go to [email or web address] - password [email or web address] [facility]  Office   [phone]  CC: Primary care physician; Micheal Simpler, MD  Note: This dictation was prepared with Dragon dictation along with smaller phrase technology. Any transcriptional errors that result from this process are unintentional.

## 2016-07-16 NOTE — Care Management Important Message (Signed)
Important Message  Patient Details  Name: JOELL PROHASKA MRN: [id number] Date of Birth: September 03, 1941   Medicare Important Message Given:  Yes    Carles Collet, RN 07/16/2016, 9:35 AM

## 2016-07-16 NOTE — Consult Note (Signed)
ANTICOAGULATION CONSULT NOTE - Initial Consult  Pharmacy Consult for warfarin Indication: atrial fibrillation  No Known Allergies  Patient Measurements: Height: 5\' 10"  (177.8 cm) Weight: 255 lb 11.7 oz (116 kg) IBW/kg (Calculated) : 73 Heparin Dosing Weight:   Vital Signs: Temp: 98.7 F (37.1 C) (10/22 0721) Temp Source: Oral (10/22 0721) BP: 122/65 (10/22 0721) Pulse Rate: 73 (10/22 0721)  Labs:  Recent Labs  07/14/16 0418 07/15/16 0546 07/16/16 0446  HGB  --  9.1*  --   HCT  --  27.2*  --   PLT  --  190  --   LABPROT 17.2* 17.2* 18.8*  INR 1.39 1.39 1.56  CREATININE 1.49* 1.59* 1.62*    Estimated Creatinine Clearance: 51.8 mL/min (by C-G formula based on SCr of 1.62 mg/dL (H)).   Medical History: Past Medical History:  Diagnosis Date  . Anxiety   . Atrial fibrillation ([place])   . 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 CAD (coronary artery disease)   . CHF (congestive heart failure) ([place])   . Chronic back pain   . Chronic systolic heart failure (HCC) ~2009   EF under 25%  . CKD (chronic kidney disease) stage 3, GFR 30-59 ml/min 2009   stage III  . Dyspnea    with exertion  . Fracture closed, humerus    never hadsurgery, left  . Gout, unspecified   . Hypertension   . Myocardial infarction    pt is unaware of this but doctors say he has  . Other left bundle branch block   . Thrombocytopenia (HCC)     Medications:  Scheduled:  . ALPRAZolam  0.5 mg Oral TID  . calcitRIOL  0.25 mcg Oral Daily  . calcium-vitamin D  1 tablet Oral BID  . Chlorhexidine Gluconate Cloth  6 each Topical Q0600  . citalopram  40 mg Oral Daily  . enoxaparin (LOVENOX) injection  115 mg Subcutaneous Q12H  . febuxostat  40 mg Oral QHS  . feeding supplement (NEPRO CARB STEADY)  237 mL Oral BID BM  . ferrous sulfate  325 mg Oral BID WC  . mupirocin ointment  1 application Nasal BID  . pravastatin  80 mg Oral q1800  . tamsulosin  0.4 mg Oral Daily  .  Warfarin - Pharmacist Dosing Inpatient   Does not apply q1800    Assessment: Pt is a 75 year old male w/ a PMH of Afib on warfarin at home. Pt is s/p left hip surgery. INR is subtherapeutic at 1.5. Pt received a total of 10mg  vit K (5mg  po, 5mg  Iv). Pharmacy consulted to restart warfarin. Patient on Lovenox bridge.  Pt home dose is 5mg  Sun,MWF and 2.5 T,T,sat. Pt last warfarin dose was 10/10  Date  INR  Warfarin Dose 10/11  2.01  0, vit K 5mg  PO 10/12  2.01  0, vit K 5mg  IV 10/13  1.79  none 10/14  1.50   5 mg 10/15  1.44  5 mg 10/16  1.40                 5 mg 10/17            1.36  Not given 10/18  1.42  6mg  10/19  1.31  6mg  10/20  1.39                 7.5 mg  10/21  1.39                 7.5 mg  10/22               1.56                 7.5 mg    Goal of Therapy:  INR 2-3  Monitor platelets by anticoagulation protocol: Yes   Plan:  INR slow to increase, effects of vit K are likely gone by now. Patient however missed dose the evening of 10/17. Will give warfarin 7.5 mg PO x 1 again today. Will recheck PT/INR with am labs.  Continue lovenox brdige 1mg /kg q 12 hrs. Will need to follow renal function closely. If renal function diminishes then will need to speak with MD about starting therapeutic heparin.   Janaye Corp D 07/16/2016,9:57 AM

## 2016-07-17 DIAGNOSIS — S7292XA Unspecified fracture of left femur, initial encounter for closed fracture: Secondary | ICD-10-CM | POA: Diagnosis not present

## 2016-07-17 DIAGNOSIS — I4891 Unspecified atrial fibrillation: Secondary | ICD-10-CM | POA: Diagnosis not present

## 2016-07-17 DIAGNOSIS — N185 Chronic kidney disease, stage 5: Secondary | ICD-10-CM | POA: Diagnosis not present

## 2016-07-17 DIAGNOSIS — R809 Proteinuria, unspecified: Secondary | ICD-10-CM | POA: Diagnosis not present

## 2016-07-17 DIAGNOSIS — Z96642 Presence of left artificial hip joint: Secondary | ICD-10-CM | POA: Diagnosis not present

## 2016-07-17 DIAGNOSIS — Z9989 Dependence on other enabling machines and devices: Secondary | ICD-10-CM | POA: Diagnosis not present

## 2016-07-17 DIAGNOSIS — I509 Heart failure, unspecified: Secondary | ICD-10-CM | POA: Diagnosis not present

## 2016-07-17 DIAGNOSIS — N390 Urinary tract infection, site not specified: Secondary | ICD-10-CM | POA: Diagnosis not present

## 2016-07-17 DIAGNOSIS — N189 Chronic kidney disease, unspecified: Secondary | ICD-10-CM | POA: Diagnosis not present

## 2016-07-17 DIAGNOSIS — R262 Difficulty in walking, not elsewhere classified: Secondary | ICD-10-CM | POA: Diagnosis not present

## 2016-07-17 DIAGNOSIS — S72009A Fracture of unspecified part of neck of unspecified femur, initial encounter for closed fracture: Secondary | ICD-10-CM | POA: Diagnosis not present

## 2016-07-17 DIAGNOSIS — N179 Acute kidney failure, unspecified: Secondary | ICD-10-CM | POA: Diagnosis not present

## 2016-07-17 DIAGNOSIS — D631 Anemia in chronic kidney disease: Secondary | ICD-10-CM | POA: Diagnosis not present

## 2016-07-17 DIAGNOSIS — Z471 Aftercare following joint replacement surgery: Secondary | ICD-10-CM | POA: Diagnosis not present

## 2016-07-17 DIAGNOSIS — I5022 Chronic systolic (congestive) heart failure: Secondary | ICD-10-CM | POA: Diagnosis not present

## 2016-07-17 DIAGNOSIS — M6281 Muscle weakness (generalized): Secondary | ICD-10-CM | POA: Diagnosis not present

## 2016-07-17 DIAGNOSIS — R296 Repeated falls: Secondary | ICD-10-CM | POA: Diagnosis not present

## 2016-07-17 DIAGNOSIS — I482 Chronic atrial fibrillation: Secondary | ICD-10-CM | POA: Diagnosis not present

## 2016-07-17 DIAGNOSIS — I5023 Acute on chronic systolic (congestive) heart failure: Secondary | ICD-10-CM | POA: Diagnosis not present

## 2016-07-17 DIAGNOSIS — J9611 Chronic respiratory failure with hypoxia: Secondary | ICD-10-CM | POA: Diagnosis not present

## 2016-07-17 DIAGNOSIS — M25552 Pain in left hip: Secondary | ICD-10-CM | POA: Diagnosis not present

## 2016-07-17 DIAGNOSIS — N2581 Secondary hyperparathyroidism of renal origin: Secondary | ICD-10-CM | POA: Diagnosis not present

## 2016-07-17 DIAGNOSIS — N184 Chronic kidney disease, stage 4 (severe): Secondary | ICD-10-CM | POA: Diagnosis not present

## 2016-07-17 LAB — BASIC METABOLIC PANEL
ANION GAP: 3 — AB (ref 5–15)
BUN: 36 mg/dL — ABNORMAL HIGH (ref 6–20)
CO2: 31 mmol/L (ref 22–32)
Calcium: 10.5 mg/dL — ABNORMAL HIGH (ref 8.9–10.3)
Chloride: 104 mmol/L (ref 101–111)
Creatinine, Ser: 1.62 mg/dL — ABNORMAL HIGH (ref 0.61–1.24)
GFR calc Af Amer: 47 mL/min — ABNORMAL LOW (ref >60)
GFR calc non Af Amer: 40 mL/min — ABNORMAL LOW (ref >60)
GLUCOSE: 100 mg/dL — AB (ref 65–99)
Potassium: 4.3 mmol/L (ref 3.5–5.1)
Sodium: 138 mmol/L (ref 135–145)

## 2016-07-17 LAB — PROTIME-INR
INR: 1.55
PROTHROMBIN TIME: 18.7 s — AB (ref 11.4–15.2)

## 2016-07-17 MED ORDER — ENOXAPARIN SODIUM 100 MG/ML ~~LOC~~ SOLN: 115.0000 mg | Freq: Two times a day (BID) | SUBCUTANEOUS | Status: DC

## 2016-07-17 MED ORDER — CALCIUM CARBONATE-VITAMIN D 500-200 MG-UNIT PO TABS: 1.0000 | ORAL_TABLET | Freq: Two times a day (BID) | ORAL | Status: DC

## 2016-07-17 MED ORDER — FUROSEMIDE 40 MG PO TABS
40.0000 mg | ORAL_TABLET | Freq: Every day | ORAL | Status: DC
Start: 2016-07-17 — End: 2016-07-27

## 2016-07-17 MED ORDER — FUROSEMIDE 40 MG PO TABS
40.0000 mg | ORAL_TABLET | Freq: Every day | ORAL | Status: DC
  Administered 2016-07-17: 40 mg via ORAL
  Filled 2016-07-17: qty 1

## 2016-07-17 MED ORDER — FERROUS SULFATE 325 (65 FE) MG PO TABS: 325.0000 mg | ORAL_TABLET | Freq: Two times a day (BID) | ORAL | 3 refills | Status: AC

## 2016-07-17 MED ORDER — IPRATROPIUM-ALBUTEROL 0.5-2.5 (3) MG/3ML IN SOLN: 3.0000 mL | RESPIRATORY_TRACT | Status: DC | PRN

## 2016-07-17 MED ORDER — HYDROCODONE-ACETAMINOPHEN 10-325 MG PO TABS: 1.0000 | ORAL_TABLET | Freq: Four times a day (QID) | ORAL | 0 refills | Status: DC | PRN

## 2016-07-17 MED ORDER — SENNOSIDES-DOCUSATE SODIUM 8.6-50 MG PO TABS: 1.0000 | ORAL_TABLET | Freq: Every evening | ORAL | Status: DC | PRN

## 2016-07-17 MED ORDER — ALUM & MAG HYDROXIDE-SIMETH 200-200-20 MG/5ML PO SUSP: 30.0000 mL | ORAL | 0 refills | Status: AC | PRN

## 2016-07-17 MED ORDER — ALPRAZOLAM 0.5 MG PO TABS: 0.5000 mg | ORAL_TABLET | Freq: Three times a day (TID) | ORAL | 0 refills | Status: DC

## 2016-07-17 NOTE — Progress Notes (Signed)
Physical Therapy Treatment Patient Details Name: Micheal [name] MRN: [id number] DOB: [birth year] Today's Date: 07/17/2016    History of Present Illness 75yo male who comes to [facility] after sustaining a fall at home with Left hip fracture. Pt was held for a few days until medically stable/cleared for surgery by cardiology, nephrology, and underwent Left hip ORIF/IM nailing on 10/13. Baseline level of funcitonal includes household AMB without AD, min-monA for bathing, and total assist for IADL. Pt does not tolerate long AMB distances. PMH: afib, PPM, CHF, EF: 25%, CKD-4. on POD he transferd from CCU to 1A    PT Comments    Per hospital protocol pt only able to complete bed level exercises and limited R LE exercises secondary to temporary R femoral catheter. Pt continues to progress with therapeutic exercises though requiring increased time to complete all exercise with slow initiation. Pt requires demonstration for correct techniques and min vc's to complete through full range. Pt requiring assistance with heel slides and SAQ secondary to fatigue. Pt will benefit from progressing therapeutic exercise and functional mobility when medically appropriate.    Follow Up Recommendations  SNF     Equipment Recommendations  Rolling walker with 5" wheels    Recommendations for Other Services       Precautions / Restrictions Precautions Precautions: Fall Precaution Comments: R temporary femoral catheter for HD  Restrictions Weight Bearing Restrictions: Yes LLE Weight Bearing: Weight bearing as tolerated    Mobility  Bed Mobility               General bed mobility comments: All bed mobility/OOB activity deferred due to R LE femoral catheter  Transfers                    Ambulation/Gait                 Stairs            Wheelchair Mobility    Modified Rankin (Stroke Patients Only)       Balance                                    Cognition  Arousal/Alertness: Awake/alert Behavior During Therapy: WFL for tasks assessed/performed Overall Cognitive Status: Within Functional Limits for tasks assessed                 General Comments: Patient's speech intermittently difficult to understand    Exercises General Exercises - Lower Extremity Ankle Circles/Pumps: AROM;Strengthening;Both;20 reps;Supine (demonstration for correct technique; increased time to complete) Quad Sets: AROM;Strengthening;Both;Supine;20 reps (increased time to complete) Short Arc Quad: AROM;Strengthening;Left;15 reps;Supine (requiring AAROM for last 5 reps secondary to fatigue) Heel Slides: Strengthening;Left;15 reps;AAROM;Supine (Pt able to complete 1st 5 reps AROM through limited range; repetitive muscle contractions with exercise; last 10 reps AAROM'; increased time to complete) Hip ABduction/ADduction: AROM;Strengthening;Left;15 reps;Supine (min vc's for to complete through full range)    General Comments General comments (skin integrity, edema, etc.): Pt is agreeable to PT session.      Pertinent Vitals/Pain Pain Assessment: 0-10 Pain Score: 1  Pain Location: L knee and below Pain Descriptors / Indicators: Sore Pain Intervention(s): Monitored during session;Limited activity within patient's tolerance;Repositioned  O2 97% or greater on 2 L nasal cannula throughout session. Pt HR 75-76 bpm throughout session.     Home Living  Prior Function            PT Goals (current goals can now be found in the care plan section) Acute Rehab PT Goals Patient Stated Goal: To leave the hospital soon and begin to be more mobile PT Goal Formulation: With patient Time For Goal Achievement: 07/22/16 Potential to Achieve Goals: Fair Progress towards PT goals: Progressing toward goals    Frequency    BID      PT Plan Current plan remains appropriate    Co-evaluation             End of Session Equipment Utilized  During Treatment: Oxygen Activity Tolerance: Patient tolerated treatment well Patient left: in bed;with call bell/phone within reach;with bed alarm set (B heels suspended)     Time: [id number] PT Time Calculation (min) (ACUTE ONLY): 43 min  Charges:                       G CodesRiley Nearing, SPT 07/17/2016, 10:47 AM

## 2016-07-17 NOTE — Progress Notes (Signed)
Initial Nutrition Assessment  DOCUMENTATION CODES:   Obesity unspecified  INTERVENTION:  -Nepro Shake po BID, each supplement provides 425 kcal and 19 grams protein -Encourage PO intake, liberalize patient's diet possibly? He complained about the food the entire time  NUTRITION DIAGNOSIS:   Inadequate oral intake related to poor appetite as evidenced by per patient/family report.  GOAL:   Patient will meet greater than or equal to 90% of their needs  MONITOR:   PO intake, I & O's, Labs, Weight trends, Skin  REASON FOR ASSESSMENT:   LOS    ASSESSMENT:   Micheal [name]  is a 75 y.o. 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  Spoke with Micheal [name] recently. He denies poor PO intake PTA, but complains about food provided during stay. His meal completion has been documented at 0-25% but has not been documented since the morning of 10/21 He had a tray at bedside, but ate nothing bedsides a banana. Pt was unsure of weight loss PTA, per chart his weight is trending upwards, likely with edema, still demonstrates significant ascites. He was on HD for a few days here. Received NePro BID and was consuming them but states "they aren't the best." Overall, he seemed agitated. D/C today, summary in.  Nutrition-Focused physical exam completed. Findings are no fat depletion, no muscle depletion, and mild edema.  Labs and medications reviewed: Ca-Vit D, Iron  Diet Order:  Diet Heart Room service appropriate? Yes; Fluid consistency: Thin Diet - low sodium heart healthy  Skin:  Reviewed, no issues  Last BM:  10/21  Height:   Ht Readings from Last 1 Encounters:  07/07/16 5\' 10"  (1.778 m)     Weight:   Wt Readings from Last 1 Encounters:  07/15/16 255 lb 11.7 oz (116 kg)    Ideal Body Weight:  75.45 kg  BMI:  Body mass index is 36.69 kg/m.  Estimated Nutritional Needs:   Kcal:  1700-2100 calories  Protein:  115-130 gm  Fluid:  >/= 1.7L  EDUCATION NEEDS:   No education needs identified at this time  Micheal [name]. Micheal Provencal, MS, RD LDN Inpatient Clinical Dietitian Pager [phone]

## 2016-07-17 NOTE — Discharge Summary (Addendum)
[place] [facility] [place] NAME: Micheal [name]    MR#:  [id number]  DATE OF BIRTH:  [birth year]  DATE OF ADMISSION:  07/05/2016   ADMITTING PHYSICIAN: Gladstone Lighter, MD  DATE OF DISCHARGE: 07/17/2016 PRIMARY CARE PHYSICIAN: [name], MD   ADMISSION DIAGNOSIS:  Fall LEFT HIP FRACTURE DISCHARGE DIAGNOSIS:  Active Problems:   Hip fracture ([place]) ARF on CKD stage 4 Ac on Chronic systolic CHF EF 123456 Acute post op anemia due to blood loss UTI SECONDARY DIAGNOSIS:   Past Medical History:  Diagnosis Date  . Anxiety   . Atrial fibrillation ([place])   . 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 CAD (coronary artery disease)   . CHF (congestive heart failure) ([place])   . Chronic back pain   . Chronic systolic heart failure (HCC) ~2009   EF under 73%  . CKD (chronic kidney disease) stage 3, GFR 30-59 ml/min 2009   stage III  . Dyspnea    with exertion  . Fracture closed, humerus    never hadsurgery, left  . Gout, unspecified   . Hypertension   . Myocardial infarction    pt is unaware of this but doctors say he has  . Other left bundle branch block   . Thrombocytopenia [facility])    HOSPITAL COURSE:  #1 Left Hip fracture- secondary to fall.  S/p surgery on 07/07/16. Pain control, DVT prophylaxis with lovenox, resumed coumadin, INR is subtherapeutic. PT: SNF placement.  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 Lasix, lisinopril and metolazone were hold. Per nephrologist, started hemodialysis on 07/11/16, 3 times so far, Cr. Is better and stable, no need for more HD today per Dr. Juleen China.   #3 Ac on Chronic systolic CHF- well compensated, EF is 25%   Chronic lower extremity edema. Lasix and metolazone were hold.   No lasix for now as renal func worsening  Was  On Gentle IV hydration to help renal func.   He was on HD. Cr. Is stable. Resume lasix 40 mg po daily today Dr. Juleen China. Hold lisinopril  due to ARF on CKD. Follow up BMP as outpatient.  #4 Afib- rate controlled, on coreg-  Warfarin was hold, s/p pacemaker  Hold COREG due to borderline BP.  Now on therapeutic lovenox.  re-started coumadin. Pharmacy to help dosing. Resume coreg.  #5 Gout-continue home medications  #6 BPH- on flomax  #7 DVT Prophylaxis-on lovenox, resumed coumadin.  #8 meningioma   incindental finding.  # 9 hematuria   and UTI   Ur cx- morgenella, sensitive to Rocephin ( started 07/06/16- stopped on 07/12/16.)   Monitor CBC. Hb stable.  # Acute post op anemia due to blood loss   Oral iron.  # acute metabolic encephalopathy due to uremia Improved after HD.   Anxiety. Continue xanax, atarax and celexa  Discussed with Dr. Juleen China.   DISCHARGE CONDITIONS:  Stable, discharge to SNF today. CONSULTS OBTAINED:  Treatment Team:  Hessie Knows, MD Yolonda Kida, MD Murlean Iba, MD Katha Cabal, MD DRUG ALLERGIES:  No Known Allergies DISCHARGE MEDICATIONS:     Medication List    STOP taking these medications   calcitRIOL 0.25 MCG capsule Commonly known as:  ROCALTROL   lisinopril 5 MG tablet Commonly known as:  PRINIVIL,ZESTRIL     TAKE these medications   ALPRAZolam 0.5 MG tablet Commonly known as:  XANAX Take 1 tablet (0.5 mg  total) by mouth 3 (three) times daily. What changed:  See the new instructions.   alum & mag hydroxide-simeth 200-200-20 MG/5ML suspension Commonly known as:  MAALOX/MYLANTA Take 30 mLs by mouth every 4 (four) hours as needed for indigestion.   calcium-vitamin D 500-200 MG-UNIT tablet Commonly known as:  OSCAL WITH D Take 1 tablet by mouth 2 (two) times daily.   carvedilol 3.125 MG tablet Commonly known as:  COREG TAKE ONE TABLET BY MOUTH TWICE DAILY WITH A MEAL   citalopram 40 MG tablet Commonly known as:  CELEXA TAKE ONE TABLET BY MOUTH ONCE DAILY   cyclobenzaprine 5 MG tablet Commonly known as:  FLEXERIL Take 1 tablet (5  mg total) by mouth 2 (two) times daily as needed for muscle spasms.   enoxaparin 100 MG/ML injection Commonly known as:  LOVENOX Inject 1.15 mLs (115 mg total) into the skin every 12 (twelve) hours.   febuxostat 40 MG tablet Commonly known as:  ULORIC Take 2 tablets (80 mg total) by mouth daily.   ferrous sulfate 325 (65 FE) MG tablet Take 1 tablet (325 mg total) by mouth 2 (two) times daily with a meal.   furosemide 40 MG tablet Commonly known as:  LASIX Take 1 tablet (40 mg total) by mouth daily. What changed:  See the new instructions.   HYDROcodone-acetaminophen 10-325 MG tablet Commonly known as:  NORCO Take 1 tablet by mouth every 6 (six) hours as needed.   hydrOXYzine 50 MG tablet Commonly known as:  ATARAX/VISTARIL TAKE ONE TABLET BY MOUTH THREE TIMES DAILY AS NEEDED   ipratropium-albuterol 0.5-2.5 (3) MG/3ML Soln Commonly known as:  DUONEB Take 3 mLs by nebulization every 4 (four) hours as needed.   metolazone 5 MG tablet Commonly known as:  ZAROXOLYN Take by mouth.   polyethylene glycol packet Commonly known as:  MIRALAX / GLYCOLAX Take 17 g by mouth daily.   potassium chloride 10 MEQ tablet Commonly known as:  K-DUR TAKE ONE TABLET BY MOUTH ONCE DAILY   pravastatin 80 MG tablet Commonly known as:  PRAVACHOL TAKE ONE TABLET BY MOUTH ONCE DAILY   senna-docusate 8.6-50 MG tablet Commonly known as:  Senokot-S Take 1 tablet by mouth at bedtime as needed for mild constipation.   tamsulosin 0.4 MG Caps capsule Commonly known as:  FLOMAX Take 0.4 mg by mouth.   warfarin 5 MG tablet Commonly known as:  COUMADIN Take 5 mg by mouth daily. 5mg  sunday,monday,wednesday,friday 2.5 Tuesday,thursday,saturday Last home dose taken tuesday   warfarin 5 MG tablet Commonly known as:  COUMADIN TAKE AS DIRECTED BY  ANTI-COAGULATION  CLINIC        DISCHARGE INSTRUCTIONS:   DIET:  Heart healthy diet DISCHARGE CONDITION:  Stable. ACTIVITY:  As  tolerated. OXYGEN:  Home Oxygen: 2 L [place]  If you experience worsening of your admission symptoms, develop shortness of breath, life threatening emergency, suicidal or homicidal thoughts you must seek medical attention immediately by calling 911 or calling your MD immediately  if symptoms less severe.  You Must read complete instructions/literature along with all the possible adverse reactions/side effects for all the Medicines you take and that have been prescribed to you. Take any new Medicines after you have completely understood and accpet all the possible adverse reactions/side effects.   Please note  You were cared for by a hospitalist during your hospital stay. If you have any questions about your discharge medications or the care you received while you were in the hospital after you  are discharged, you can call the unit and asked to speak with the hospitalist on call if the hospitalist that took care of you is not available. Once you are discharged, your primary care physician will handle any further medical issues. Please note that NO REFILLS for any discharge medications will be authorized once you are discharged, as it is imperative that you return to your primary care physician (or establish a relationship with a primary care physician if you do not have one) for your aftercare needs so that they can reassess your need for medications and monitor your lab values.    On the day of Discharge:  VITAL SIGNS:  Blood pressure 135/67, pulse 75, temperature 98 F (36.7 C), temperature source Oral, resp. rate 20, height 5\' 10"  (1.778 m), weight 255 lb 11.7 oz (116 kg), SpO2 100 %. PHYSICAL EXAMINATION:  GENERAL:  75 y.o.-year-old patient lying in the bed with no acute distress.  EYES: Pupils equal, round, reactive to light and accommodation. No scleral icterus. Extraocular muscles intact.  HEENT: Head atraumatic, normocephalic. Oropharynx and nasopharynx clear.  NECK:  Supple, no jugular  venous distention. No thyroid enlargement, no tenderness.  LUNGS: Normal breath sounds bilaterally, no wheezing, rales,rhonchi or crepitation. No use of accessory muscles of respiration.  CARDIOVASCULAR: S1, S2 normal. No murmurs, rubs, or gallops.  ABDOMEN: Soft, non-tender, non-distended. Bowel sounds present. No organomegaly or mass.  EXTREMITIES: No pedal edema, cyanosis, or clubbing.  NEUROLOGIC: Cranial nerves II through XII are intact. Muscle strength 2-3/5 in all extremities. Sensation intact. Gait not checked.  PSYCHIATRIC: The patient is alert and oriented x 3.  SKIN: No obvious rash, lesion, or ulcer.  DATA REVIEW:   CBC  Recent Labs Lab 07/15/16 0546  WBC 8.1  HGB 9.1*  HCT 27.2*  PLT 190    Chemistries   Recent Labs Lab 07/12/16 0408  07/17/16 0748  NA 140  < > 138  K 3.6  < > 4.3  CL 104  < > 104  CO2 29  < > 31  GLUCOSE 107*  < > 100*  BUN 68*  < > 36*  CREATININE 2.48*  < > 1.62*  CALCIUM 9.6  < > 10.5*  MG 2.9*  --   --   < > = values in this interval not displayed.   Microbiology Results  Results for orders placed or performed during the hospital encounter of 07/05/16  Urine culture     Status: Abnormal   Collection Time: 07/05/16  7:59 AM  Result Value Ref Range Status   Specimen Description URINE, RANDOM  Final   Special Requests NONE  Final   Culture >=100,000 COLONIES/mL MORGANELLA MORGANII (A)  Final   Report Status 07/08/2016 FINAL  Final   Organism ID, Bacteria MORGANELLA MORGANII (A)  Final      Susceptibility   Morganella morganii - MIC*    AMPICILLIN >=32 RESISTANT Resistant     CEFAZOLIN >=64 RESISTANT Resistant     CEFTRIAXONE <=1 SENSITIVE Sensitive     CIPROFLOXACIN <=0.25 SENSITIVE Sensitive     GENTAMICIN <=1 SENSITIVE Sensitive     IMIPENEM 4 SENSITIVE Sensitive     NITROFURANTOIN 64 RESISTANT Resistant     TRIMETH/SULFA <=20 SENSITIVE Sensitive     AMPICILLIN/SULBACTAM >=32 RESISTANT Resistant     PIP/TAZO <=4 SENSITIVE  Sensitive     * >=100,000 COLONIES/mL MORGANELLA MORGANII  Surgical PCR screen     Status: Abnormal   Collection Time: 07/05/16  1:18  PM  Result Value Ref Range Status   MRSA, PCR POSITIVE (A) NEGATIVE Final    Comment: RESULT CALLED TO, READ BACK BY AND VERIFIED WITH: ERIN CARTER ON 07/05/16 AT 1553 [place]    Staphylococcus aureus POSITIVE (A) NEGATIVE Final    Comment:        The Xpert SA Assay (FDA approved for NASAL specimens in patients over 32 years of age), is one component of a comprehensive surveillance program.  Test performance has been validated by [facility] for patients greater than or equal to 93 year old. It is not intended to diagnose infection nor to guide or monitor treatment.     RADIOLOGY:  No results found.   Management plans discussed with the patient, family and they are in agreement.  CODE STATUS:     Code Status Orders        Start     Ordered   07/05/16 1219  Full code  Continuous     07/05/16 1218    Code Status History    Date Active Date Inactive Code Status Order ID Comments User Context   This patient has a current code status but no historical code status.    Advance Directive Documentation   Flowsheet Row Most Recent Value  Type of Advance Directive  Healthcare Power of Attorney, Living will  Pre-existing out of facility DNR order (yellow form or pink MOST form)  No data  "MOST" Form in Place?  No data      TOTAL TIME TAKING CARE OF THIS PATIENT: 37 minutes.    [name] M.D on 07/17/2016 at 11:17 AM  Between 7am to 6pm - Pager - [phone]  After 6pm go to [email or web address] - Proofreader  [facility] [place]  Office  [phone]  CC: Primary care physician; [name], MD   Note: This dictation was prepared with Dragon dictation along with smaller phrase technology. Any transcriptional errors that result from this process are unintentional.

## 2016-07-17 NOTE — Progress Notes (Signed)
   Subjective: 10 Days Post-Op Procedure(s) (LRB): INTRAMEDULLARY (IM) NAIL INTERTROCHANTRIC (Left) Patient reports pain as mild with rest.  Patient is well, and has had no acute complaints or problems Denies any CP, SOB, ABD pain. Plan is to go Skilled nursing facility after hospital stay.  Objective: Vital signs in last 24 hours: Temp:  [98 F (36.7 C)-99.4 F (37.4 C)] 98 F (36.7 C) (10/23 0746) Pulse Rate:  [74-75] 75 (10/23 0746) Resp:  [17-20] 20 (10/23 0746) BP: (114-141)/(59-75) 135/67 (10/23 0746) SpO2:  [94 %-100 %] 100 % (10/23 0746)  Intake/Output from previous day: 10/22 0701 - 10/23 0700 In: -  Out: 325 [Urine:325] Intake/Output this shift: No intake/output data recorded.   Recent Labs  07/15/16 0546  HGB 9.1*    Recent Labs  07/15/16 0546  WBC 8.1  RBC 3.01*  HCT 27.2*  PLT 190    Recent Labs  07/15/16 0546 07/16/16 0446 07/17/16 0748  NA 140  --  138  K 3.8  --  4.3  CL 104  --  104  CO2 32  --  31  BUN 31*  --  36*  CREATININE 1.59* 1.62* 1.62*  GLUCOSE 111*  --  100*  CALCIUM 9.8  --  10.5*    Recent Labs  07/16/16 0446 07/17/16 0355  INR 1.56 1.55    EXAM General - Patient is Alert, Appropriate and Oriented Extremity - Neurovascular intact Sensation intact distally Intact pulses distally Dorsiflexion/Plantar flexion intact No cellulitis present Compartment soft no edema  Proximal thigh softWith no edema. Dressing - dressing C/D/I and scant drainage. No erythema. Motor Function - intact, moving foot and toes well on exam.   Past Medical History:  Diagnosis Date  . Anxiety   . Atrial fibrillation ([place])   . 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 CAD (coronary artery disease)   . CHF (congestive heart failure) ([place])   . Chronic back pain   . Chronic systolic heart failure (HCC) ~2009   EF under 25%  . CKD (chronic kidney disease) stage 3, GFR 30-59 ml/min 2009   stage III  . Dyspnea     with exertion  . Fracture closed, humerus    never hadsurgery, left  . Gout, unspecified   . Hypertension   . Myocardial infarction    pt is unaware of this but doctors say he has  . Other left bundle branch block   . Thrombocytopenia (HCC)     Assessment/Plan:   10 Days Post-Op Procedure(s) (LRB): INTRAMEDULLARY (IM) NAIL INTERTROCHANTRIC (Left) Active Problems:   Hip fracture (HCC)  Estimated body mass index is 36.69 kg/m as calculated from the following:   Height as of this encounter: 5\' 10"  (1.778 m).   Weight as of this encounter: 116 kg (255 lb 11.7 oz).  Acute post op blood loss anemia - stable 9.1.  PT limited by right femoral catheter. Discharge to SNF when he is cleared by medicine.  Remove staples and apply steri strips on 07/21/16 Follow up with [place] [facility] in 6 weeks   DVT Prophylaxis - Lovenox, Foot Pumps and TED hose Weight-Bearing as tolerated to left leg   Duanne Guess PA-C [place] 07/17/2016, 8:24 AM

## 2016-07-17 NOTE — Clinical Social Work Placement (Signed)
   CLINICAL SOCIAL WORK PLACEMENT  NOTE  Date:  07/17/2016  Patient Details  Name: Micheal [name] MRN: [id number] Date of Birth: 1941/03/18  Clinical Social Work is seeking post-discharge placement for this patient at the [place] level of care (*CSW will initial, date and re-position this form in  chart as items are completed):  Yes   Patient/family provided with [facility] Work Department's list of facilities offering this level of care within the geographic area requested by the patient (or if unable, by the patient's family).  Yes   Patient/family informed of their freedom to choose among providers that offer the needed level of care, that participate in Medicare, Medicaid or managed care program needed by the patient, have an available bed and are willing to accept the patient.  Yes   Patient/family informed of [facility]'s ownership interest in [facility] and [facility], as well as of the fact that they are under no obligation to receive care at these facilities.  PASRR submitted to EDS on       PASRR number received on       Existing PASRR number confirmed on 07/05/16     FL2 transmitted to all facilities in geographic area requested by pt/family on 07/05/16     FL2 transmitted to all facilities within larger geographic area on       Patient informed that his/her managed care company has contracts with or will negotiate with certain facilities, including the following:        Yes   Patient/family informed of bed offers received.  Patient chooses bed at  [facility] )     Physician recommends and patient chooses bed at      Patient to be transferred to  [facility] ) on 07/17/16.  Patient to be transferred to facility by  [facility] )     Patient family notified on 07/17/16 of transfer.  Name of family member notified:   (Patient's daughters Dalene Seltzer and [name] are aware of D/C today. )     PHYSICIAN        Additional Comment:    _______________________________________________ Tejah Brekke, Veronia Beets, LCSW 07/17/2016, 12:40 PM

## 2016-07-17 NOTE — Progress Notes (Signed)
Subjective:   Laying in bed. No complaints.  Creatinine 1.62 Ca 10.5 - calcitriol  Objective:  Vital signs in last 24 hours:  Temp:  [98 F (36.7 C)-99.4 F (37.4 C)] 98 F (36.7 C) (10/23 1146) Pulse Rate:  [74-75] 74 (10/23 1146) Resp:  [18-20] 18 (10/23 1146) BP: (131-141)/(65-75) 133/65 (10/23 1146) SpO2:  [94 %-100 %] 97 % (10/23 1146)  Weight change:  Filed Weights   07/13/16 0925 07/13/16 1237 07/15/16 0412  Weight: 116.4 kg (256 lb 9.9 oz) 116.4 kg (256 lb 9.9 oz) 116 kg (255 lb 11.7 oz)    Intake/Output:    Intake/Output Summary (Last 24 hours) at 07/17/16 1200 Last data filed at 07/17/16 0630  Gross per 24 hour  Intake                0 ml  Output              325 ml  Net             -325 ml     Physical Exam: General: Chronically ill-appearing, laying in bed  HEENT Anicteric, moist mucous membranes  Neck supple  Pulm/lungs Normal breathing effort, decreased breath sounds at bases  CVS/Heart no rub or gallop, paced rhythm  Abdomen:  Soft, nontender, nondistended  Extremities: 1+ dependent pitting edema   Neurologic: Alert and oriented  Skin: No rashes  Access: Right femoral dialysis catheter       Basic Metabolic Panel:   Recent Labs Lab 07/11/16 0354 07/11/16 1800 07/12/16 0408 07/13/16 1014 07/14/16 0418 07/15/16 0546 07/16/16 0446 07/17/16 0748  NA 137  --  140  --  141 140  --  138  K 3.8  --  3.6  --  3.8 3.8  --  4.3  CL 101  --  104  --  104 104  --  104  CO2 27  --  29  --  33* 32  --  31  GLUCOSE 113*  --  107*  --  115* 111*  --  100*  BUN 95*  --  68*  --  26* 31*  --  36*  CREATININE 3.37*  --  2.48*  --  1.49* 1.59* 1.62* 1.62*  CALCIUM 9.4  --  9.6  --  9.2 9.8  --  10.5*  MG  --   --  2.9*  --   --   --   --   --   PHOS  --  3.4 2.8 1.9*  --  2.7  --   --      CBC:  Recent Labs Lab 07/11/16 0354 07/15/16 0546  WBC 7.6 8.1  HGB 8.4* 9.1*  HCT 24.6* 27.2*  MCV 88.7 90.2  PLT 146* 190       Microbiology:  Recent Results (from the past 720 hour(s))  Urine culture     Status: Abnormal   Collection Time: 07/05/16  7:59 AM  Result Value Ref Range Status   Specimen Description URINE, RANDOM  Final   Special Requests NONE  Final   Culture >=100,000 COLONIES/mL MORGANELLA MORGANII (A)  Final   Report Status 07/08/2016 FINAL  Final   Organism ID, Bacteria MORGANELLA MORGANII (A)  Final      Susceptibility   Morganella morganii - MIC*    AMPICILLIN >=32 RESISTANT Resistant     CEFAZOLIN >=64 RESISTANT Resistant     CEFTRIAXONE <=1 SENSITIVE Sensitive     CIPROFLOXACIN <=0.25 SENSITIVE  Sensitive     GENTAMICIN <=1 SENSITIVE Sensitive     IMIPENEM 4 SENSITIVE Sensitive     NITROFURANTOIN 64 RESISTANT Resistant     TRIMETH/SULFA <=20 SENSITIVE Sensitive     AMPICILLIN/SULBACTAM >=32 RESISTANT Resistant     PIP/TAZO <=4 SENSITIVE Sensitive     * >=100,000 COLONIES/mL MORGANELLA MORGANII  Surgical PCR screen     Status: Abnormal   Collection Time: 07/05/16  1:18 PM  Result Value Ref Range Status   MRSA, PCR POSITIVE (A) NEGATIVE Final    Comment: RESULT CALLED TO, READ BACK BY AND VERIFIED WITH: ERIN CARTER ON 07/05/16 AT 1553 [place]    Staphylococcus aureus POSITIVE (A) NEGATIVE Final    Comment:        The Xpert SA Assay (FDA approved for NASAL specimens in patients over 31 years of age), is one component of a comprehensive surveillance program.  Test performance has been validated by [facility] for patients greater than or equal to 58 year old. It is not intended to diagnose infection nor to guide or monitor treatment.     Coagulation Studies:  Recent Labs  07/15/16 0546 07/16/16 0446 07/17/16 0355  LABPROT 17.2* 18.8* 18.7*  INR 1.39 1.56 1.55    Urinalysis: No results for input(s): COLORURINE, LABSPEC, PHURINE, GLUCOSEU, HGBUR, BILIRUBINUR, KETONESUR, PROTEINUR, UROBILINOGEN, NITRITE, LEUKOCYTESUR in the last 72 hours.  Invalid input(s):  APPERANCEUR    Imaging: No results found.   Medications:     . ALPRAZolam  0.5 mg Oral TID  . calcitRIOL  0.25 mcg Oral Daily  . calcium-vitamin D  1 tablet Oral BID  . citalopram  40 mg Oral Daily  . enoxaparin (LOVENOX) injection  115 mg Subcutaneous Q12H  . febuxostat  40 mg Oral QHS  . feeding supplement (NEPRO CARB STEADY)  237 mL Oral BID BM  . ferrous sulfate  325 mg Oral BID WC  . furosemide  40 mg Oral Daily  . pravastatin  80 mg Oral q1800  . tamsulosin  0.4 mg Oral Daily  . Warfarin - Pharmacist Dosing Inpatient   Does not apply q1800   acetaminophen **OR** acetaminophen, alum & mag hydroxide-simeth, cyclobenzaprine, guaiFENesin-dextromethorphan, HYDROcodone-acetaminophen, HYDROmorphone (DILAUDID) injection, hydrOXYzine, ipratropium-albuterol, magnesium citrate, magnesium hydroxide, menthol-cetylpyridinium **OR** phenol, metoCLOPramide **OR** metoCLOPramide (REGLAN) injection, ondansetron **OR** ondansetron (ZOFRAN) IV, polyethylene glycol, senna-docusate  Assessment/ Plan:  75 y.o. white male with hypertension, morbid obesity, gout, history of nonsteroidal use, chronic atrial fibrillation, hyperlipidemia,diabetes, depression, AICD, congestive heart failure with LVEF 25%, chronic kidney disease now presents with comminuted intertrochanteric fracture of the left femur  1.  Acute renal failure on chronic kidney disease stage IV with proteinuria. baseline creatinine is 2.25/GFR 28 based on labs from July 25 Cause of ARF likely secondary to volume depletion/UTI.  Required hemodialysis. Last treatment was 10/19, Thursday. No acute indication for further dialysis - please remove dialysis catheter.   2.  Anemia chronic kidney disease.  Hemoglobin 9.1.  - continue to monitor.  - Fe sulfate  3. Secondary Hyperparathyroidism with hypercalcemia: PTH low at 54.  - discontinue calcitriol for now  4.  Lower extremity edema -  Appears to be stable at the moment. Patient off  diuretic therapy.  Follow up with   LOS: [place], [place] 10/23/201712:00 PM

## 2016-07-17 NOTE — Progress Notes (Signed)
Patient is being discharged to [facility] via EMS. IV removed. Dialysis catheter in groin removed and intact. Daughter called and updated.

## 2016-07-17 NOTE — Progress Notes (Signed)
Patient is medically stable for D/C to [facility] today. Per MD patient will not require dialysis. Per Seth Bake admissions coordinator at [facility] patient will go to room 329. RN will call report at [phone] and arrange EMS for transport. Clinical Education officer, museum (CSW) sent D/C orders to [facility] [employer] via [employer]. Patient is aware of above. CSW contacted patient's daughters [name] and Dalene Seltzer and made them aware of above. Please reconsult if future social work needs arise. CSW signing off.   [employer], LCSW [phone]

## 2016-07-18 DIAGNOSIS — F39 Unspecified mood [affective] disorder: Secondary | ICD-10-CM

## 2016-07-18 DIAGNOSIS — S7292XA Unspecified fracture of left femur, initial encounter for closed fracture: Secondary | ICD-10-CM

## 2016-07-18 DIAGNOSIS — J9611 Chronic respiratory failure with hypoxia: Secondary | ICD-10-CM

## 2016-07-18 DIAGNOSIS — I5022 Chronic systolic (congestive) heart failure: Secondary | ICD-10-CM

## 2016-07-18 DIAGNOSIS — N185 Chronic kidney disease, stage 5: Secondary | ICD-10-CM

## 2016-07-18 DIAGNOSIS — I482 Chronic atrial fibrillation: Secondary | ICD-10-CM

## 2016-07-21 DIAGNOSIS — I482 Chronic atrial fibrillation: Secondary | ICD-10-CM | POA: Diagnosis not present

## 2016-07-21 LAB — PROTIME-INR

## 2016-07-24 ENCOUNTER — Encounter: Payer: Self-pay | Admitting: Internal Medicine

## 2016-07-25 DIAGNOSIS — I4891 Unspecified atrial fibrillation: Secondary | ICD-10-CM | POA: Diagnosis not present

## 2016-07-25 DIAGNOSIS — I482 Chronic atrial fibrillation: Secondary | ICD-10-CM | POA: Diagnosis not present

## 2016-07-25 LAB — PROTIME-INR

## 2016-07-26 ENCOUNTER — Telehealth: Payer: Self-pay

## 2016-07-26 ENCOUNTER — Observation Stay
Admission: EM | Admit: 2016-07-26 | Discharge: 2016-07-27 | Disposition: A | Discharge status: Skilled Nursing Facility | Payer: Medicare Other | Attending: Internal Medicine | Admitting: Internal Medicine

## 2016-07-26 ENCOUNTER — Encounter: Payer: Self-pay | Admitting: Emergency Medicine

## 2016-07-26 ENCOUNTER — Encounter: Payer: Self-pay | Admitting: Internal Medicine

## 2016-07-26 DIAGNOSIS — S71012A Laceration without foreign body, left hip, initial encounter: Secondary | ICD-10-CM | POA: Diagnosis not present

## 2016-07-26 DIAGNOSIS — Z833 Family history of diabetes mellitus: Secondary | ICD-10-CM | POA: Insufficient documentation

## 2016-07-26 DIAGNOSIS — L7632 Postprocedural hematoma of skin and subcutaneous tissue following other procedure: Principal | ICD-10-CM | POA: Insufficient documentation

## 2016-07-26 DIAGNOSIS — M549 Dorsalgia, unspecified: Secondary | ICD-10-CM | POA: Insufficient documentation

## 2016-07-26 DIAGNOSIS — D649 Anemia, unspecified: Secondary | ICD-10-CM | POA: Insufficient documentation

## 2016-07-26 DIAGNOSIS — I447 Left bundle-branch block, unspecified: Secondary | ICD-10-CM | POA: Diagnosis not present

## 2016-07-26 DIAGNOSIS — Z87891 Personal history of nicotine dependence: Secondary | ICD-10-CM | POA: Diagnosis not present

## 2016-07-26 DIAGNOSIS — N183 Chronic kidney disease, stage 3 (moderate): Secondary | ICD-10-CM | POA: Insufficient documentation

## 2016-07-26 DIAGNOSIS — J9611 Chronic respiratory failure with hypoxia: Secondary | ICD-10-CM | POA: Insufficient documentation

## 2016-07-26 DIAGNOSIS — D696 Thrombocytopenia, unspecified: Secondary | ICD-10-CM | POA: Insufficient documentation

## 2016-07-26 DIAGNOSIS — F419 Anxiety disorder, unspecified: Secondary | ICD-10-CM | POA: Insufficient documentation

## 2016-07-26 DIAGNOSIS — Z7901 Long term (current) use of anticoagulants: Secondary | ICD-10-CM | POA: Insufficient documentation

## 2016-07-26 DIAGNOSIS — S71002A Unspecified open wound, left hip, initial encounter: Secondary | ICD-10-CM | POA: Diagnosis not present

## 2016-07-26 DIAGNOSIS — N179 Acute kidney failure, unspecified: Secondary | ICD-10-CM | POA: Insufficient documentation

## 2016-07-26 DIAGNOSIS — M109 Gout, unspecified: Secondary | ICD-10-CM | POA: Diagnosis not present

## 2016-07-26 DIAGNOSIS — Y838 Other surgical procedures as the cause of abnormal reaction of the patient, or of later complication, without mention of misadventure at the time of the procedure: Secondary | ICD-10-CM | POA: Insufficient documentation

## 2016-07-26 DIAGNOSIS — Z95810 Presence of automatic (implantable) cardiac defibrillator: Secondary | ICD-10-CM | POA: Diagnosis not present

## 2016-07-26 DIAGNOSIS — F329 Major depressive disorder, single episode, unspecified: Secondary | ICD-10-CM | POA: Diagnosis not present

## 2016-07-26 DIAGNOSIS — I2510 Atherosclerotic heart disease of native coronary artery without angina pectoris: Secondary | ICD-10-CM | POA: Diagnosis not present

## 2016-07-26 DIAGNOSIS — T8130XA Disruption of wound, unspecified, initial encounter: Secondary | ICD-10-CM

## 2016-07-26 DIAGNOSIS — I482 Chronic atrial fibrillation: Secondary | ICD-10-CM | POA: Insufficient documentation

## 2016-07-26 DIAGNOSIS — E1122 Type 2 diabetes mellitus with diabetic chronic kidney disease: Secondary | ICD-10-CM | POA: Diagnosis not present

## 2016-07-26 DIAGNOSIS — R0600 Dyspnea, unspecified: Secondary | ICD-10-CM | POA: Diagnosis not present

## 2016-07-26 DIAGNOSIS — E785 Hyperlipidemia, unspecified: Secondary | ICD-10-CM | POA: Insufficient documentation

## 2016-07-26 DIAGNOSIS — R58 Hemorrhage, not elsewhere classified: Secondary | ICD-10-CM

## 2016-07-26 DIAGNOSIS — I252 Old myocardial infarction: Secondary | ICD-10-CM | POA: Diagnosis not present

## 2016-07-26 DIAGNOSIS — E860 Dehydration: Secondary | ICD-10-CM | POA: Diagnosis not present

## 2016-07-26 DIAGNOSIS — I5022 Chronic systolic (congestive) heart failure: Secondary | ICD-10-CM | POA: Diagnosis not present

## 2016-07-26 DIAGNOSIS — S7002XA Contusion of left hip, initial encounter: Secondary | ICD-10-CM | POA: Diagnosis not present

## 2016-07-26 DIAGNOSIS — Z8249 Family history of ischemic heart disease and other diseases of the circulatory system: Secondary | ICD-10-CM | POA: Insufficient documentation

## 2016-07-26 DIAGNOSIS — I129 Hypertensive chronic kidney disease with stage 1 through stage 4 chronic kidney disease, or unspecified chronic kidney disease: Secondary | ICD-10-CM | POA: Diagnosis not present

## 2016-07-26 DIAGNOSIS — N189 Chronic kidney disease, unspecified: Secondary | ICD-10-CM

## 2016-07-26 DIAGNOSIS — G8929 Other chronic pain: Secondary | ICD-10-CM | POA: Insufficient documentation

## 2016-07-26 DIAGNOSIS — I130 Hypertensive heart and chronic kidney disease with heart failure and stage 1 through stage 4 chronic kidney disease, or unspecified chronic kidney disease: Secondary | ICD-10-CM | POA: Diagnosis not present

## 2016-07-26 DIAGNOSIS — N400 Enlarged prostate without lower urinary tract symptoms: Secondary | ICD-10-CM | POA: Insufficient documentation

## 2016-07-26 DIAGNOSIS — Z992 Dependence on renal dialysis: Secondary | ICD-10-CM | POA: Insufficient documentation

## 2016-07-26 DIAGNOSIS — T148XXA Other injury of unspecified body region, initial encounter: Secondary | ICD-10-CM

## 2016-07-26 DIAGNOSIS — Z79899 Other long term (current) drug therapy: Secondary | ICD-10-CM | POA: Insufficient documentation

## 2016-07-26 LAB — APTT: APTT: 39 s — AB (ref 24–36)

## 2016-07-26 LAB — CBC WITH DIFFERENTIAL/PLATELET
BASOS ABS: 0.1 10*3/uL (ref 0–0.1)
BASOS PCT: 1 %
Eosinophils Absolute: 0.2 10*3/uL (ref 0–0.7)
Eosinophils Relative: 2 %
HEMATOCRIT: 34.2 % — AB (ref 40.0–52.0)
Hemoglobin: 11.1 g/dL — ABNORMAL LOW (ref 13.0–18.0)
LYMPHS PCT: 14 %
Lymphs Abs: 1.1 10*3/uL (ref 1.0–3.6)
MCH: 30.4 pg (ref 26.0–34.0)
MCHC: 32.6 g/dL (ref 32.0–36.0)
MCV: 93.2 fL (ref 80.0–100.0)
MONO ABS: 1.1 10*3/uL — AB (ref 0.2–1.0)
Monocytes Relative: 14 %
NEUTROS ABS: 5.6 10*3/uL (ref 1.4–6.5)
Neutrophils Relative %: 69 %
PLATELETS: 322 10*3/uL (ref 150–440)
RBC: 3.67 MIL/uL — AB (ref 4.40–5.90)
RDW: 17.6 % — AB (ref 11.5–14.5)
WBC: 8.2 10*3/uL (ref 3.8–10.6)

## 2016-07-26 LAB — BASIC METABOLIC PANEL
ANION GAP: 8 (ref 5–15)
Anion gap: 7 (ref 5–15)
BUN: 53 mg/dL — AB (ref 6–20)
BUN: 51 mg/dL — AB (ref 6–20)
CALCIUM: 10 mg/dL (ref 8.9–10.3)
CALCIUM: 9.9 mg/dL (ref 8.9–10.3)
CO2: 34 mmol/L — ABNORMAL HIGH (ref 22–32)
CO2: 32 mmol/L (ref 22–32)
Chloride: 97 mmol/L — ABNORMAL LOW (ref 101–111)
Chloride: 100 mmol/L — ABNORMAL LOW (ref 101–111)
Creatinine, Ser: 2.16 mg/dL — ABNORMAL HIGH (ref 0.61–1.24)
Creatinine, Ser: 2.28 mg/dL — ABNORMAL HIGH (ref 0.61–1.24)
GFR calc Af Amer: 33 mL/min — ABNORMAL LOW (ref >60)
GFR calc Af Amer: 31 mL/min — ABNORMAL LOW (ref >60)
GFR, EST NON AFRICAN AMERICAN: 29 mL/min — AB (ref >60)
GFR, EST NON AFRICAN AMERICAN: 27 mL/min — AB (ref >60)
GLUCOSE: 128 mg/dL — AB (ref 65–99)
GLUCOSE: 134 mg/dL — AB (ref 65–99)
Potassium: 3.6 mmol/L (ref 3.5–5.1)
Potassium: 3.9 mmol/L (ref 3.5–5.1)
SODIUM: 139 mmol/L (ref 135–145)
SODIUM: 139 mmol/L (ref 135–145)

## 2016-07-26 LAB — HEMOGLOBIN
HEMOGLOBIN: 11 g/dL — AB (ref 13.0–18.0)
Hemoglobin: 10.5 g/dL — ABNORMAL LOW (ref 13.0–18.0)
Hemoglobin: 10.4 g/dL — ABNORMAL LOW (ref 13.0–18.0)

## 2016-07-26 LAB — TROPONIN I
Troponin I: 0.07 ng/mL (ref <0.03)
Troponin I: 0.06 ng/mL (ref <0.03)

## 2016-07-26 LAB — PROTIME-INR
INR: 1.46
Prothrombin Time: 17.9 seconds — ABNORMAL HIGH (ref 11.4–15.2)

## 2016-07-26 MED ORDER — ALPRAZOLAM 0.5 MG PO TABS
0.5000 mg | ORAL_TABLET | Freq: Three times a day (TID) | ORAL | Status: DC
  Administered 2016-07-26 – 2016-07-27 (×4): 0.5 mg via ORAL
  Filled 2016-07-26 (×4): qty 1

## 2016-07-26 MED ORDER — IPRATROPIUM-ALBUTEROL 0.5-2.5 (3) MG/3ML IN SOLN: 3.0000 mL | RESPIRATORY_TRACT | Status: DC | PRN

## 2016-07-26 MED ORDER — TAMSULOSIN HCL 0.4 MG PO CAPS
0.4000 mg | ORAL_CAPSULE | Freq: Every day | ORAL | Status: DC
  Administered 2016-07-26 – 2016-07-27 (×2): 0.4 mg via ORAL
  Filled 2016-07-26 (×2): qty 1

## 2016-07-26 MED ORDER — SODIUM CHLORIDE 0.9 % IV SOLN
250.0000 mL | INTRAVENOUS | Status: DC | PRN
Start: 2016-07-26 — End: 2016-07-27

## 2016-07-26 MED ORDER — FERROUS SULFATE 325 (65 FE) MG PO TABS
325.0000 mg | ORAL_TABLET | Freq: Two times a day (BID) | ORAL | Status: DC
  Administered 2016-07-26 – 2016-07-27 (×2): 325 mg via ORAL
  Filled 2016-07-26 (×2): qty 1

## 2016-07-26 MED ORDER — WARFARIN - PHYSICIAN DOSING INPATIENT
Freq: Every day | Status: DC
  Administered 2016-07-26: 1

## 2016-07-26 MED ORDER — ONDANSETRON HCL 4 MG/2ML IJ SOLN: 4.0000 mg | Freq: Four times a day (QID) | INTRAMUSCULAR | Status: DC | PRN

## 2016-07-26 MED ORDER — ACETAMINOPHEN 325 MG PO TABS: 650.0000 mg | ORAL_TABLET | Freq: Four times a day (QID) | ORAL | Status: DC | PRN

## 2016-07-26 MED ORDER — SODIUM CHLORIDE 0.9 % IV BOLUS (SEPSIS)
500.0000 mL | Freq: Once | INTRAVENOUS | Status: AC
  Administered 2016-07-26: 500 mL via INTRAVENOUS

## 2016-07-26 MED ORDER — LIDOCAINE-EPINEPHRINE (PF) 1 %-1:200000 IJ SOLN
INTRAMUSCULAR | Status: AC
  Administered 2016-07-26: 10 mL
  Filled 2016-07-26: qty 30

## 2016-07-26 MED ORDER — SODIUM CHLORIDE 0.9% FLUSH: 3.0000 mL | INTRAVENOUS | Status: DC | PRN

## 2016-07-26 MED ORDER — HYDROXYZINE HCL 25 MG PO TABS
50.0000 mg | ORAL_TABLET | Freq: Three times a day (TID) | ORAL | Status: DC | PRN
  Administered 2016-07-26: 50 mg via ORAL
  Filled 2016-07-26: qty 2

## 2016-07-26 MED ORDER — WARFARIN SODIUM 2.5 MG PO TABS: 2.5000 mg | ORAL_TABLET | ORAL | Status: DC

## 2016-07-26 MED ORDER — CALCITRIOL 0.25 MCG PO CAPS
0.2500 ug | ORAL_CAPSULE | Freq: Every day | ORAL | Status: DC
  Administered 2016-07-26 – 2016-07-27 (×2): 0.25 ug via ORAL
  Filled 2016-07-26 (×2): qty 1

## 2016-07-26 MED ORDER — CITALOPRAM HYDROBROMIDE 20 MG PO TABS
40.0000 mg | ORAL_TABLET | Freq: Every day | ORAL | Status: DC
  Administered 2016-07-26: 40 mg via ORAL
  Filled 2016-07-26 (×2): qty 2

## 2016-07-26 MED ORDER — DOCUSATE SODIUM 100 MG PO CAPS
100.0000 mg | ORAL_CAPSULE | Freq: Two times a day (BID) | ORAL | Status: DC
  Administered 2016-07-26 – 2016-07-27 (×3): 100 mg via ORAL
  Filled 2016-07-26 (×3): qty 1

## 2016-07-26 MED ORDER — ATORVASTATIN CALCIUM 20 MG PO TABS
20.0000 mg | ORAL_TABLET | Freq: Every day | ORAL | Status: DC
  Administered 2016-07-26: 20 mg via ORAL
  Filled 2016-07-26: qty 1

## 2016-07-26 MED ORDER — CITALOPRAM HYDROBROMIDE 20 MG PO TABS
20.0000 mg | ORAL_TABLET | Freq: Every day | ORAL | Status: DC
  Administered 2016-07-27: 20 mg via ORAL
  Filled 2016-07-26: qty 1

## 2016-07-26 MED ORDER — ALUM & MAG HYDROXIDE-SIMETH 200-200-20 MG/5ML PO SUSP: 30.0000 mL | ORAL | Status: DC | PRN

## 2016-07-26 MED ORDER — ONDANSETRON HCL 4 MG PO TABS: 4.0000 mg | ORAL_TABLET | Freq: Four times a day (QID) | ORAL | Status: DC | PRN

## 2016-07-26 MED ORDER — FEBUXOSTAT 40 MG PO TABS
80.0000 mg | ORAL_TABLET | Freq: Every day | ORAL | Status: DC
  Administered 2016-07-26 – 2016-07-27 (×2): 80 mg via ORAL
  Filled 2016-07-26 (×2): qty 2

## 2016-07-26 MED ORDER — ASPIRIN EC 81 MG PO TBEC
81.0000 mg | DELAYED_RELEASE_TABLET | Freq: Once | ORAL | Status: AC
  Administered 2016-07-26: 81 mg via ORAL
  Filled 2016-07-26: qty 1

## 2016-07-26 MED ORDER — CARVEDILOL 3.125 MG PO TABS
3.1250 mg | ORAL_TABLET | Freq: Two times a day (BID) | ORAL | Status: DC
  Administered 2016-07-26 – 2016-07-27 (×3): 3.125 mg via ORAL
  Filled 2016-07-26 (×3): qty 1

## 2016-07-26 MED ORDER — SENNA 8.6 MG PO TABS: 1.0000 | ORAL_TABLET | Freq: Every evening | ORAL | Status: DC | PRN

## 2016-07-26 MED ORDER — SODIUM CHLORIDE 0.9 % IV SOLN
INTRAVENOUS | Status: AC
  Administered 2016-07-26 – 2016-07-27 (×2): via INTRAVENOUS

## 2016-07-26 MED ORDER — WARFARIN SODIUM 5 MG PO TABS
5.0000 mg | ORAL_TABLET | ORAL | Status: DC
  Administered 2016-07-26: 5 mg via ORAL
  Filled 2016-07-26: qty 1

## 2016-07-26 MED ORDER — POLYETHYLENE GLYCOL 3350 17 G PO PACK
17.0000 g | PACK | Freq: Every day | ORAL | Status: DC
  Administered 2016-07-26: 17 g via ORAL
  Filled 2016-07-26: qty 1

## 2016-07-26 MED ORDER — NEPRO/CARBSTEADY PO LIQD
237.0000 mL | Freq: Three times a day (TID) | ORAL | Status: DC
  Administered 2016-07-26 – 2016-07-27 (×4): 237 mL via ORAL

## 2016-07-26 MED ORDER — HYDROCODONE-ACETAMINOPHEN 10-325 MG PO TABS
1.0000 | ORAL_TABLET | Freq: Four times a day (QID) | ORAL | Status: DC | PRN
  Administered 2016-07-26 – 2016-07-27 (×2): 1 via ORAL
  Filled 2016-07-26 (×2): qty 1

## 2016-07-26 MED ORDER — SODIUM CHLORIDE 0.9% FLUSH
3.0000 mL | Freq: Two times a day (BID) | INTRAVENOUS | Status: DC
  Administered 2016-07-26 (×2): 3 mL via INTRAVENOUS

## 2016-07-26 NOTE — NC FL2 (Signed)
  [place] LEVEL OF CARE SCREENING TOOL     IDENTIFICATION  Patient Name: Micheal [name] Birthdate: 01-19-1941 Sex: male Admission Date (Current Location): 07/26/2016  [place] and [place] Number:  Engineering geologist and Address:  [facility], [address], [place],  [address]      Provider Number: [id number]  Attending Physician Name and Address:  Theodoro Grist, MD  Relative Name and Phone Number:       Current Level of Care: Hospital Recommended Level of Care: [place] Prior Approval Number:    Date Approved/Denied:   PASRR Number:  ( [id number] )  Discharge Plan: SNF    Current Diagnoses: Patient Active Problem List   Diagnosis Date Noted  . Hematoma 07/26/2016  . Acute on chronic renal failure ([place]) 07/26/2016  . Hip fracture ([place]) 07/05/2016  . Thrombocytopenia ([place])   . Preventative health care 04/15/2015  . Chronic hypoxemic respiratory failure ([place]) 04/15/2015  . BPH (benign prostatic hypertrophy) 09/22/2014  . Encounter for therapeutic drug monitoring 12/17/2013  . Chronic back pain   . Long term (current) use of anticoagulants 11/05/2012  . Hypertension   . Mood disorder ([place])   . Chronic kidney disease, stage IV (severe) ([place])   . Gout, unspecified   . LBBB 03/04/2009  . Atrial fibrillation ([place]) 03/04/2009  . Chronic systolic heart failure ([place]) 03/04/2009  . ICD - IN SITU 03/04/2009    Orientation RESPIRATION BLADDER Height & Weight     Self, Time, Situation, Place  O2 (2 Liters Oxygen ) Incontinent Weight: 255 lb (115.7 kg) Height:  5\' 10"  (177.8 cm)  BEHAVIORAL SYMPTOMS/MOOD NEUROLOGICAL BOWEL NUTRITION STATUS   (none)  (none) Continent Diet (Regular Diet )  AMBULATORY STATUS COMMUNICATION OF NEEDS Skin   Extensive Assist Verbally Surgical wounds (07/07/16: Incision Left Hip. )                       Personal Care Assistance Level of Assistance  Bathing, Feeding,  Dressing Bathing Assistance: Limited assistance Feeding assistance: Independent Dressing Assistance: Limited assistance     Functional Limitations Info  Sight, Hearing, Speech Sight Info: Adequate Hearing Info: Adequate Speech Info: Adequate    SPECIAL CARE FACTORS FREQUENCY  PT (By licensed PT), OT (By licensed OT)     PT Frequency:  (5) OT Frequency:  (5)            Contractures      Additional Factors Info  Code Status, Allergies, Isolation Precautions Code Status Info:  (Full Code. ) Allergies Info:  (No Known Allergies. )     Isolation Precautions Info:  (MRSA Nasal Swab. )     Current Medications (07/26/2016):  This is the current hospital active medication list Current Facility-Administered Medications  Medication Dose Route Frequency Provider Last Rate Last Dose  . 0.9 %  sodium chloride infusion   Intravenous Continuous Theodoro Grist, MD      . feeding supplement (NEPRO CARB STEADY) liquid 237 mL  237 mL Oral TID BM Theodoro Grist, MD         Discharge Medications: Please see discharge summary for a list of discharge medications.  Relevant Imaging Results:  Relevant Lab Results:   Additional Information  (SSN: [id number])  Dyamond Tolosa, [place] Beets, LCSW

## 2016-07-26 NOTE — Telephone Encounter (Signed)
Per chart review pt is at [facility] ED. 

## 2016-07-26 NOTE — Telephone Encounter (Signed)
We will not address  This now as patient is in hospital.

## 2016-07-26 NOTE — H&P (Signed)
[place] at [place] NAME: Micheal [name]    MR#:  [id number]  DATE OF BIRTH:  July 23, 1941  DATE OF ADMISSION:  07/26/2016  PRIMARY CARE PHYSICIAN: [name], MD   REQUESTING/REFERRING PHYSICIAN:   CHIEF COMPLAINT:   Chief Complaint  Patient presents with  . Post-op Problem    HISTORY OF PRESENT ILLNESS: Micheal [name]  is a 75 y.o. male with a known history of Multiple medical problems including recent comminuted intratrochanteric hip fracture of left femur, status post intramedullary nail placement 10/13/ 2017 by Dr. Rudene Christians, chronic atrial fibrillation, hyperlipidemia, diabetes, depression, ICD, congestive heart failure with ejection fraction of 25%, acute on chronic renal failure requiring hemodialysis during his stay in the hospital in October postoperatively, was discharged to skilled nursing facility for rehabilitation comes back from admit from nursing facility today due to bleeding from incision site. Apparently patient had some blood-tinged discharge from left hip surgery site since discharge from the hospital, but doesn't drainage was not significant. Yesterday he was noted to have significant bleeding. Staples have been taken out by nursing home staff recently. On arrival to emergency room,  physician stitched's surgical incision site due to recurrent bleeding. Hospitalist services were contacted for admission. Since the patient's creatinine was also noted to be worsening. He admits of poor oral intake , mainly because he does not like the food. He denies any chest pain, shortness of breath, abdominal pain or any other discomfort. Denies any urinary retention. Not able to get full review of systems due to somnolence  PAST MEDICAL HISTORY:   Past Medical History:  Diagnosis Date  . Anxiety   . Atrial fibrillation ([place])   . 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 CAD (coronary artery disease)   . CHF  (congestive heart failure) ([place])   . Chronic back pain   . Chronic systolic heart failure (HCC) ~2009   EF under 25%  . CKD (chronic kidney disease) stage 3, GFR 30-59 ml/min 2009   stage III  . Dyspnea    with exertion  . Fracture closed, humerus    never hadsurgery, left  . Gout, unspecified   . Hypertension   . Myocardial infarction    pt is unaware of this but doctors say he has  . Other left bundle branch block   . Thrombocytopenia ([place])     PAST SURGICAL HISTORY: Past Surgical History:  Procedure Laterality Date  . Fracture of left humerus     Rebroken 12/13. never had surgery  . ICD implant     St. Jude Durata; 12/27/07-CHF, afib, LBB, 3 vessel disease  . INTRAMEDULLARY (IM) NAIL INTERTROCHANTERIC Left 07/07/2016   Procedure: INTRAMEDULLARY (IM) NAIL INTERTROCHANTRIC;  Surgeon: Hessie Knows, MD;  Location: [facility] ORS;  Service: Orthopedics;  Laterality: Left;    SOCIAL HISTORY:  Social History  Substance Use Topics  . Smoking status: Former Smoker    Quit date: 09/25/1993  . Smokeless tobacco: Never Used     Comment: quit 15 years ago  . Alcohol use No     Comment: past use but not currently    FAMILY HISTORY:  Family History  Problem Relation Age of Onset  . Heart failure Mother   . CAD Father   . Diabetes Mellitus II Father     DRUG ALLERGIES: No Known Allergies  Review of Systems  Unable to perform ROS: Mental acuity    MEDICATIONS AT HOME:  Prior to Admission medications   Medication Sig Start Date End Date Taking? Authorizing Provider  ALPRAZolam Duanne Moron) 0.5 MG tablet Take 1 tablet (0.5 mg total) by mouth 3 (three) times daily. 07/17/16  Yes [name], MD  alum & mag hydroxide-simeth (MAALOX/MYLANTA) 200-200-20 MG/5ML suspension Take 30 mLs by mouth every 4 (four) hours as needed for indigestion. 07/17/16  Yes [name], MD  atorvastatin (LIPITOR) 20 MG tablet Take 20 mg by mouth daily.   Yes Historical Provider, MD  calcitRIOL (ROCALTROL) 0.25 MCG  capsule Take 0.25 mcg by mouth daily.   Yes Historical Provider, MD  carvedilol (COREG) 3.125 MG tablet TAKE ONE TABLET BY MOUTH TWICE DAILY WITH A MEAL 06/26/16  Yes Venia Carbon, MD  citalopram (CELEXA) 40 MG tablet TAKE ONE TABLET BY MOUTH ONCE DAILY 05/01/16  Yes Venia Carbon, MD  enoxaparin (LOVENOX) 120 MG/0.8ML injection Inject 115 mg into the skin every 12 (twelve) hours. 0.76 mL q12h until INR is greater than 2.0, then discontinue   Yes Historical Provider, MD  febuxostat (ULORIC) 40 MG tablet Take 2 tablets (80 mg total) by mouth daily. 01/26/14  Yes Venia Carbon, MD  ferrous sulfate 325 (65 FE) MG tablet Take 1 tablet (325 mg total) by mouth 2 (two) times daily with a meal. 07/17/16  Yes [name], MD  furosemide (LASIX) 40 MG tablet Take 1 tablet (40 mg total) by mouth daily. Patient taking differently: Take 80 mg by mouth daily.  07/17/16  Yes [name], MD  HYDROcodone-acetaminophen [facility]) 10-325 MG tablet Take 1 tablet by mouth every 6 (six) hours as needed. 07/17/16  Yes [name], MD  hydrOXYzine (ATARAX/VISTARIL) 50 MG tablet TAKE ONE TABLET BY MOUTH THREE TIMES DAILY AS NEEDED Patient taking differently: TAKE ONE TABLET BY MOUTH TWO TIMES DAILY AS NEEDED 04/24/16  Yes Venia Carbon, MD  ipratropium-albuterol (DUONEB) 0.5-2.5 (3) MG/3ML SOLN Take 3 mLs by nebulization every 4 (four) hours as needed. 07/17/16  Yes [name], MD  polyethylene glycol [facility] / GLYCOLAX) packet Take 17 g by mouth daily.   Yes Historical Provider, MD  potassium chloride (K-DUR) 10 MEQ tablet TAKE ONE TABLET BY MOUTH ONCE DAILY 03/27/16  Yes Venia Carbon, MD  senna (SENOKOT) 8.6 MG TABS tablet Take 1 tablet by mouth at bedtime as needed for mild constipation.   Yes Historical Provider, MD  tamsulosin (FLOMAX) 0.4 MG CAPS capsule Take 0.4 mg by mouth.   Yes Historical Provider, MD  warfarin (COUMADIN) 2.5 MG tablet Take 2.5 mg by mouth daily. Take 2.5mg  every Tuesday, Thursday, Saturday   Yes  Historical Provider, MD  warfarin (COUMADIN) 5 MG tablet Take 5 mg by mouth daily. 5mg  sunday,monday,wednesday,friday 2.5 Tuesday,thursday,saturday Last home dose taken tuesday   Yes Historical Provider, MD      PHYSICAL EXAMINATION:   VITAL SIGNS: Blood pressure 123/60, pulse 73, temperature 97.8 F (36.6 C), temperature source Oral, resp. rate 20, height 5\' 10"  (1.778 m), weight 115.7 kg (255 lb), SpO2 97 %.  GENERAL:  75 y.o.-year-old patient lying in the bed with no acute distress, Somnolent, difficult to awaken, opens his eyes and briefly converses into his back to sleep. Intermittent muscle myoclonus all over his body.  EYES: Pupils equal, round, reactive to light and accommodation. No scleral icterus. Extraocular muscles intact.  HEENT: Head atraumatic, normocephalic. Oropharynx and nasopharynx clear.  NECK:  Supple, no jugular venous distention. No thyroid enlargement, no tenderness.  LUNGS: Normal breath sounds bilaterally,  no wheezing, rales,rhonchi or crepitation. No use of accessory muscles of respiration.  CARDIOVASCULAR: S1, S2 normal. No murmurs, rubs, or gallops.  ABDOMEN: Soft, nontender, nondistended. Bowel sounds present. No organomegaly or mass.  EXTREMITIES: No pedal edema, cyanosis, or clubbing. Left lower extremity hip area. Incision site is sutured. No active bleeding noted NEUROLOGIC: Cranial nerves II through XII are intact. Muscle strength 5/5 in all extremities. Sensation intact. Gait not checked. Diffuse myoclonus PSYCHIATRIC: The patient is alert and oriented x 3.  SKIN: No obvious rash, lesion, or ulcer.   LABORATORY PANEL:   CBC  Recent Labs Lab 07/26/16 0513  WBC 8.2  HGB 11.1*  HCT 34.2*  PLT 322  MCV 93.2  MCH 30.4  MCHC 32.6  RDW 17.6*  LYMPHSABS 1.1  MONOABS 1.1*  EOSABS 0.2  BASOSABS 0.1   ------------------------------------------------------------------------------------------------------------------  Chemistries   Recent  Labs Lab 07/26/16 0646  NA 139  K 3.9  CL 100*  CO2 32  GLUCOSE 134*  BUN 51*  CREATININE 2.28*  CALCIUM 9.9   ------------------------------------------------------------------------------------------------------------------  Cardiac Enzymes No results for input(s): TROPONINI in the last 168 hours. ------------------------------------------------------------------------------------------------------------------  RADIOLOGY: No results found.  EKG: Orders placed or performed during the hospital encounter of 07/05/16  . ED EKG  . ED EKG    IMPRESSION AND PLAN:  Active Problems:   Hematoma   Acute on chronic renal failure (HCC)  #1. Bleeding surgical incision site, suspend Lovenox, get orthopedic surgery to see patient in consultation and follow along #2. Dehydration, suspend Lasix, initiate IV fluids, start patient on Nepro again #3. Acute on chronic renal failure, suspend Lasix, initiate low rate IV fluids #4. Anemia, get Hemoccult, follow hgb level closely for posthemorrhagic anemia and transfuse patient as needed  All the records are reviewed and case discussed with ED provider. Management plans discussed with the patient, family and they are in agreement.  CODE STATUS: Code Status History    Date Active Date Inactive Code Status Order ID Comments User Context   07/05/2016 12:18 PM 07/17/2016  5:38 PM Full Code [id number]  Gladstone Lighter, MD Inpatient       TOTAL TIME TAKING CARE OF THIS PATIENT: 50 minutes.    Theodoro Grist M.D on 07/26/2016 at 8:22 AM  Between 7am to 6pm - Pager - [phone] After 6pm go to [email or web address] - password [email or web address] [place] [facility]  Office  [phone]  CC: Primary care physician; [name], MD

## 2016-07-26 NOTE — ED Notes (Signed)
Pt resting with daughter at bedside. Awaiting ready bed.

## 2016-07-26 NOTE — ED Notes (Signed)
Pt reports he feels like he has to have a BM, pt placed on bed pan.

## 2016-07-26 NOTE — Clinical Social Work Note (Signed)
Clinical Social Work Assessment  Patient Details  Name: Micheal [name] MRN: [id number] Date of Birth: [birth year]  Date of referral:  07/26/16               Reason for consult:  Other (Comment Required) (From [facility] )                Permission sought to share information with:  Chartered certified accountant granted to share information::  Yes, Verbal Permission Granted  Name::      Chief Executive Officer::   [place]   Relationship::     Contact Information:     Housing/Transportation Living arrangements for the past 2 months:  [place], [place] of Information:  Patient, Power of [place], Adult Children Patient Interpreter Needed:  None Criminal Activity/Legal Involvement Pertinent to Current Situation/Hospitalization:  No - Comment as needed Significant Relationships:  Adult Children Lives with:  Facility Resident, Self Do you feel safe going back to the place where you live?  Yes Need for family participation in patient care:  Yes (Comment)  Care giving concerns:  Patient came into [facility] today from [facility] where he is at for short term rehab.    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 CSW met with patient and his daughter/ HPOA Micheal [name] [phone] was at bedside. CSW is familiar with patient and his 2 daughters from previous admission. Per Micheal [name] she lives in [place] and has Malin and her sister Micheal [name] lives in [place]. Per Micheal [name] if she cannot be reached [place] can act as HPOA. Per Micheal [name] patient has been doing well at [facility] and the plan is to return there to continue short term rehab. Per daughter patient was admitted today because his incision was bleeding more them [facility] felt comfortable with. Per daughter patient may go back to [facility] today if he is stable. Per daughter patient was living alone  in [place] prior to rehab at [facility].   [facility] admissions coordinator at [facility] is aware of above. FL2 complete and faxed out. CSW will continue to follow and assist as needed.   Employment status:  Retired Forensic scientist:  Medicare PT Recommendations:  Not assessed at this time [place] / Referral to community resources:  [place]  Patient/Family's Response to care:  Patient and daughter are agreeable for patient to return to [facility].   Patient/Family's Understanding of and Emotional Response to Diagnosis, Current Treatment, and Prognosis:  Patient reported that rehab is hard. CSW provided emotional support and encouragement.   Emotional Assessment Appearance:  Appears stated age Attitude/Demeanor/Rapport:    Affect (typically observed):  Accepting, Pleasant, Adaptable Orientation:  Oriented to Self, Oriented to Place, Oriented to  Time, Oriented to Situation Alcohol / Substance use:  Not Applicable Psych involvement (Current and /or in the community):  No (Comment)  Discharge Needs  Concerns to be addressed:  Discharge Planning Concerns Readmission within the last 30 days:  Yes Current discharge risk:  Dependent with Mobility Barriers to Discharge:  Continued Medical Work up   [employer], Micheal Beets, LCSW 07/26/2016, 10:44 AM

## 2016-07-26 NOTE — Progress Notes (Signed)
Bladder scan 1st reading 350 ml 2nd reading 254 ml

## 2016-07-26 NOTE — Telephone Encounter (Signed)
MD INR left v/m with called INR report; on 07/25/16 INR was 1.4. Per chart review pt is currently admitted to [facility]. Dr Silvio Pate out of office.

## 2016-07-26 NOTE — ED Notes (Signed)
Orders for HGB to be recollected at [facility] 07/26/16.

## 2016-07-26 NOTE — ED Provider Notes (Signed)
[facility] Emergency Department Provider Note    First MD Initiated Contact with Patient 07/26/16 [phone]     (approximate)  I have reviewed the triage vital signs and the nursing notes.   HISTORY  Chief Complaint Post-op Problem    HPI Micheal [name] is a 75 y.o. male on Coumadin and Lovenox with history of A. fib and multiple chronic medical illnesses as listed below presents due to bleeding from left hip incision. Patient had left IM nail performed roughly 13 days ago. Sutures removed yesterday at rehabilitation facility. Since then he had a steady ooze from that site that over the course of the day soaked through a truck bed. He remained hemodynamically stable but the facility was unable to get control the bleeding with frequent dressing changes. Presents to ER with no other complaints at this time. There's been no complaint of fevers. He does admit to decreased appetite. While in the hospital he was placed on dialysis due to renal failure.   Past Medical History:  Diagnosis Date  . Anxiety   . Atrial fibrillation ([place])   . 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 CAD (coronary artery disease)   . CHF (congestive heart failure) ([place])   . Chronic back pain   . Chronic systolic heart failure (HCC) ~2009   EF under 25%  . CKD (chronic kidney disease) stage 3, GFR 30-59 ml/min 2009   stage III  . Dyspnea    with exertion  . Fracture closed, humerus    never hadsurgery, left  . Gout, unspecified   . Hypertension   . Myocardial infarction    pt is unaware of this but doctors say he has  . Other left bundle branch block   . Thrombocytopenia [facility])     Patient Active Problem List   Diagnosis Date Noted  . Hip fracture ([place]) 07/05/2016  . Thrombocytopenia ([place])   . Preventative health care 04/15/2015  . Chronic hypoxemic respiratory failure ([place]) 04/15/2015  . BPH (benign prostatic hypertrophy) 09/22/2014  . Encounter  for therapeutic drug monitoring 12/17/2013  . Chronic back pain   . Long term (current) use of anticoagulants 11/05/2012  . Hypertension   . Mood disorder ([place])   . Chronic kidney disease, stage IV (severe) ([place])   . Gout, unspecified   . LBBB 03/04/2009  . Atrial fibrillation ([place]) 03/04/2009  . Chronic systolic heart failure ([place]) 03/04/2009  . ICD - IN SITU 03/04/2009    Past Surgical History:  Procedure Laterality Date  . Fracture of left humerus     Rebroken 12/13. never had surgery  . ICD implant     St. Jude [place]ata; 12/27/07-CHF, afib, LBB, 3 vessel disease  . INTRAMEDULLARY (IM) NAIL INTERTROCHANTERIC Left 07/07/2016   Procedure: INTRAMEDULLARY (IM) NAIL INTERTROCHANTRIC;  Surgeon: Hessie Knows, MD;  Location: [facility] ORS;  Service: Orthopedics;  Laterality: Left;    Prior to Admission medications   Medication Sig Start Date End Date Taking? Authorizing Provider  ALPRAZolam Duanne Moron) 0.5 MG tablet Take 1 tablet (0.5 mg total) by mouth 3 (three) times daily. 07/17/16   [name], MD  alum & mag hydroxide-simeth (MAALOX/MYLANTA) 200-200-20 MG/5ML suspension Take 30 mLs by mouth every 4 (four) hours as needed for indigestion. 07/17/16   [name], MD  calcium-vitamin D (OSCAL WITH D) 500-200 MG-UNIT tablet Take 1 tablet by mouth 2 (two) times daily. 07/17/16   [name], MD  carvedilol (COREG) 3.125  MG tablet TAKE ONE TABLET BY MOUTH TWICE DAILY WITH A MEAL 06/26/16   Venia Carbon, MD  citalopram (CELEXA) 40 MG tablet TAKE ONE TABLET BY MOUTH ONCE DAILY 05/01/16   Venia Carbon, MD  cyclobenzaprine (FLEXERIL) 5 MG tablet Take 1 tablet (5 mg total) by mouth 2 (two) times daily as needed for muscle spasms. 05/08/13   Venia Carbon, MD  enoxaparin (LOVENOX) 100 MG/ML injection Inject 1.15 mLs (115 mg total) into the skin every 12 (twelve) hours. 07/17/16   [name], MD  febuxostat (ULORIC) 40 MG tablet Take 2 tablets (80 mg total) by mouth daily. 01/26/14   Venia Carbon, MD    ferrous sulfate 325 (65 FE) MG tablet Take 1 tablet (325 mg total) by mouth 2 (two) times daily with a meal. 07/17/16   [name], MD  furosemide (LASIX) 40 MG tablet Take 1 tablet (40 mg total) by mouth daily. 07/17/16   [name], MD  HYDROcodone-acetaminophen (NORCO) 10-325 MG tablet Take 1 tablet by mouth every 6 (six) hours as needed. 07/17/16   [name], MD  hydrOXYzine (ATARAX/VISTARIL) 50 MG tablet TAKE ONE TABLET BY MOUTH THREE TIMES DAILY AS NEEDED 04/24/16   Venia Carbon, MD  ipratropium-albuterol (DUONEB) 0.5-2.5 (3) MG/3ML SOLN Take 3 mLs by nebulization every 4 (four) hours as needed. 07/17/16   [name], MD  metolazone (ZAROXOLYN) 5 MG tablet Take by mouth. 06/02/13   Historical Provider, MD  polyethylene glycol (MIRALAX / GLYCOLAX) packet Take 17 g by mouth daily.    Historical Provider, MD  potassium chloride (K-DUR) 10 MEQ tablet TAKE ONE TABLET BY MOUTH ONCE DAILY 03/27/16   Venia Carbon, MD  pravastatin (PRAVACHOL) 80 MG tablet TAKE ONE TABLET BY MOUTH ONCE DAILY 04/24/16   Venia Carbon, MD  senna-docusate (SENOKOT-S) 8.6-50 MG tablet Take 1 tablet by mouth at bedtime as needed for mild constipation. 07/17/16   [name], MD  tamsulosin (FLOMAX) 0.4 MG CAPS capsule Take 0.4 mg by mouth.    Historical Provider, MD  warfarin (COUMADIN) 5 MG tablet TAKE AS DIRECTED BY  ANTI-COAGULATION  CLINIC 05/01/16   Venia Carbon, MD  warfarin (COUMADIN) 5 MG tablet Take 5 mg by mouth daily. 5mg  sunday,monday,wednesday,friday 2.5 Tuesday,thursday,saturday Last home dose taken tuesday    Historical Provider, MD    Allergies Review of patient's allergies indicates no known allergies.  Family History  Problem Relation Age of Onset  . Heart failure Mother   . CAD Father   . Diabetes Mellitus II Father     Social History Social History  Substance Use Topics  . Smoking status: Former Smoker    Quit date: 09/25/1993  . Smokeless tobacco: Never Used     Comment: quit 15 years  ago  . Alcohol use No     Comment: past use but not currently    Review of Systems Patient denies headaches, rhinorrhea, blurry vision, numbness, shortness of breath, chest pain, edema, cough, abdominal pain, nausea, vomiting, diarrhea, dysuria, fevers, rashes or hallucinations unless otherwise stated above in HPI. ____________________________________________   PHYSICAL EXAM:  VITAL SIGNS: Vitals:   07/26/16 0530 07/26/16 0600  BP: 115/72 103/65  Pulse: 74 75  Resp:    Temp:      Constitutional: Alert and oriented. Chronically ill appearing in NAD Eyes: Conjunctivae are normal. PERRL. EOMI. Head: Atraumatic. Nose: No congestion/rhinnorhea. Mouth/Throat: Mucous membranes are dry.  Oropharynx non-erythematous. Neck: No stridor. Painless ROM. No  cervical spine tenderness to palpation Hematological/Lymphatic/Immunilogical: No cervical lymphadenopathy. Cardiovascular: Normal rate, regular rhythm. Grossly normal heart sounds.  Good peripheral circulation. Respiratory: Normal respiratory effort on 2L .  No retractions. Lungs with bibasilar crackles. Gastrointestinal: Soft and nontender. No distention. No abdominal bruits. No CVA tenderness. Musculoskeletal: No lower extremity tenderness nor edema.  Left hip incision with 1cm open draining wound from removal of staples.  No pulsatile bleeding.  No purulent drainage,  Small area of underlying hematoma  No joint effusions. Skin:  Skin is warm, dry and intact. No rash noted.   ____________________________________________   LABS (all labs ordered are listed, but only abnormal results are displayed)  Results for orders placed or performed during the hospital encounter of 07/26/16 (from the past 24 hour(s))  CBC with Differential     Status: Abnormal   Collection Time: 07/26/16  5:13 AM  Result Value Ref Range   WBC 8.2 3.8 - 10.6 K/uL   RBC 3.67 (L) 4.40 - 5.90 MIL/uL   Hemoglobin 11.1 (L) 13.0 - 18.0 g/dL   HCT 34.2 (L) 40.0 -  52.0 %   MCV 93.2 80.0 - 100.0 fL   MCH 30.4 26.0 - 34.0 pg   MCHC 32.6 32.0 - 36.0 g/dL   RDW 17.6 (H) 11.5 - 14.5 %   Platelets 322 150 - 440 K/uL   Neutrophils Relative % 69 %   Neutro Abs 5.6 1.4 - 6.5 K/uL   Lymphocytes Relative 14 %   Lymphs Abs 1.1 1.0 - 3.6 K/uL   Monocytes Relative 14 %   Monocytes Absolute 1.1 (H) 0.2 - 1.0 K/uL   Eosinophils Relative 2 %   Eosinophils Absolute 0.2 0 - 0.7 K/uL   Basophils Relative 1 %   Basophils Absolute 0.1 0 - 0.1 K/uL  Protime-INR     Status: Abnormal   Collection Time: 07/26/16  5:13 AM  Result Value Ref Range   Prothrombin Time 17.9 (H) 11.4 - 15.2 seconds   INR 1.46   APTT     Status: Abnormal   Collection Time: 07/26/16  5:13 AM  Result Value Ref Range   aPTT 39 (H) 24 - 36 seconds  Basic metabolic panel     Status: Abnormal   Collection Time: 07/26/16  5:13 AM  Result Value Ref Range   Sodium 139 135 - 145 mmol/L   Potassium 3.6 3.5 - 5.1 mmol/L   Chloride 97 (L) 101 - 111 mmol/L   CO2 34 (H) 22 - 32 mmol/L   Glucose, Bld 128 (H) 65 - 99 mg/dL   BUN 53 (H) 6 - 20 mg/dL   Creatinine, Ser 2.16 (H) 0.61 - 1.24 mg/dL   Calcium 10.0 8.9 - 10.3 mg/dL   GFR calc non Af Amer 29 (L) >60 mL/min   GFR calc Af Amer 33 (L) >60 mL/min   Anion gap 8 5 - 15   ____________________________________________  EKG_______________________________  RADIOLOGY  ___________________________________________   PROCEDURES  Procedure(s) performed: yes .Marland KitchenLaceration Repair Date/Time: 07/26/2016 8:05 AM Performed by: Merlyn Lot Authorized by: Merlyn Lot   Consent:    Consent obtained:  Verbal   Consent given by:  Patient Anesthesia (see MAR for exact dosages):    Anesthesia method:  Local infiltration   Local anesthetic:  Lidocaine 1% WITH epi Laceration details:    Location:  Leg   Leg location:  L hip   Length (cm):  2   Depth (mm):  10 Repair type:  Repair type:  Simple Pre-procedure details:    Preparation:   Patient was prepped and draped in usual sterile fashion Treatment:    Area cleansed with:  Betadine   Amount of cleaning:  Standard Skin repair:    Repair method:  Sutures   Suture size:  5-0   Suture material:  Nylon   Suture technique:  Horizontal mattress   Number of sutures:  1 Approximation:    Approximation:  Close Post-procedure details:    Dressing:  Open (no dressing)   Patient tolerance of procedure:  Tolerated well, no immediate complications       Critical Care performed: no ____________________________________________   INITIAL IMPRESSION / ASSESSMENT AND PLAN / ED COURSE  Pertinent labs & imaging results that were available during my care of the patient were reviewed by me and considered in my medical decision making (see chart for details).  DDX: laceration, dehiscence, acute blood loss anemia, aki, supratherapeutic anticoagulation  DEAMONTAE ROUSER is a 75 y.o. who presents to the ED with bleeding from left hip incision after removal of sutures at nursing facility. Hemostasis obtained with horizontal mattress suture. There is evidence of small underlying hematoma but patient hemodynamic stable. Thigh compartments otherwise soft. No evidence of surrounding infection. Laboratory evaluation ordered due to multiple comorbidities and evaluate for supratherapeutic anticoagulation. Patient remains subtherapeutic on his INR, has evidence of a KI and is likely supratherapeutic on Lovenox which would explain the persistent oozing and bleeding from the wound. Spoke with orthopedics on-call and based on patient's comorbidities likely benefit from observation and further evaluation and management and conjunction with internal medicine regarding proper anticoagulation going forward. Patient would also benefit from serial hemoglobins to evaluate for any evidence of acute blood loss anemia. He remains hemodynamic stable and in no acute distress.  Have discussed with the patient and  available family all diagnostics and treatments performed thus far and all questions were answered to the best of my ability. The patient demonstrates understanding and agreement with plan.   Clinical Course     ____________________________________________   FINAL CLINICAL IMPRESSION(S) / ED DIAGNOSES  Final diagnoses:  Hematoma of hip, left, initial encounter  Current use of long term anticoagulation  Hemorrhage  Acute kidney injury superimposed on chronic kidney disease ([place])      NEW MEDICATIONS STARTED DURING THIS VISIT:  New Prescriptions   No medications on file     Note:  This document was prepared using Dragon voice recognition software and may include unintentional dictation errors.    Merlyn Lot, MD 07/26/16 [phone]

## 2016-07-26 NOTE — Care Management Obs Status (Signed)
[facility] NOTIFICATION   Patient Details  Name: Micheal [name] MRN: [id number] Date of Birth: November 05, 1940   Medicare Observation Status Notification Given:  Yes    Beau Fanny, RN 07/26/2016, 8:11 AM

## 2016-07-26 NOTE — Telephone Encounter (Signed)
PLEASE NOTE: All timestamps contained within this report are represented as [place] Standard Time. CONFIDENTIALTY NOTICE: This fax transmission is intended only for the addressee. It contains information that is legally privileged, confidential or otherwise protected from use or disclosure. If you are not the intended recipient, you are strictly prohibited from reviewing, disclosing, copying using or disseminating any of this information or taking any action in reliance on or regarding this information. If you have received this fax in error, please notify us immediately by telephone so that we can arrange for its return to [place]. Phone: [phone], Toll-Free: [phone], Fax: [phone] Page: 1 of 1 Call Id: [id number] [place] Night - Client Nonclinical Telephone Record [place] Night - Client Client Site [place] Physician [name] - MD Contact Type Call Who Is Calling Physician / Provider / Hospital Call Type Provider Call [facility] Page Now Reason for Call Request to speak to Physician Initial Comment Caller is Lelan Pons from [facility] @ [phone]. Pt is bleeding heavily from his left hip incision site. Additional Comment Patient Name Micheal [name] Patient DOB 1941-04-25 Requesting Provider [facility] Physician Number [phone] Facility Name [place] Phone DateTime Result/Outcome Message Type Notes Kathlene November - MD [id number] 07/26/2016 3:10:14 AM Paged On Call Back to Call Center Doctor Paged Please call Amy w/ [facility] @ [phone] Kathlene November - MD 07/26/2016 3:15:14 AM Spoke with On Call - General Message Result On call returned page. Information provided and transferred to caller. Call Closed By: [place] [facility] Transaction Date/Time: 07/26/2016 3:04:06 AM (ET)

## 2016-07-26 NOTE — Plan of Care (Signed)
Informed Dr. Concerning Critical value Troponin 0.07.

## 2016-07-26 NOTE — ED Triage Notes (Signed)
Pt arrived via EMS from [facility]. Pt reports having recent left hip surgery (based on notes it was at this facility). Reports having staples removed yesterday and tonight woke up to bleeding from site. Upon assessment it appears as though the proximal area of the wound may still be open and may possibly have packing in it, but patient is unsure. Dressing has blood on it, but there is no active bleeding at this time. Pt has no complaints at time of triage.

## 2016-07-26 NOTE — Progress Notes (Signed)
Per Dr. Rudene Christians patient will need a wound vac. [facility] admissions coordinator at [facility] is aware of above. Clinical Education officer, museum (CSW) sent wound vac orders to [employer]   [employer], LCSW [phone]

## 2016-07-26 NOTE — Telephone Encounter (Signed)
noted 

## 2016-07-27 DIAGNOSIS — N179 Acute kidney failure, unspecified: Secondary | ICD-10-CM | POA: Diagnosis not present

## 2016-07-27 DIAGNOSIS — T8131XD Disruption of external operation (surgical) wound, not elsewhere classified, subsequent encounter: Secondary | ICD-10-CM | POA: Diagnosis not present

## 2016-07-27 DIAGNOSIS — Z96642 Presence of left artificial hip joint: Secondary | ICD-10-CM | POA: Diagnosis not present

## 2016-07-27 DIAGNOSIS — J9611 Chronic respiratory failure with hypoxia: Secondary | ICD-10-CM | POA: Diagnosis not present

## 2016-07-27 DIAGNOSIS — Z87891 Personal history of nicotine dependence: Secondary | ICD-10-CM | POA: Diagnosis not present

## 2016-07-27 DIAGNOSIS — Z79899 Other long term (current) drug therapy: Secondary | ICD-10-CM | POA: Diagnosis not present

## 2016-07-27 DIAGNOSIS — S72012A Unspecified intracapsular fracture of left femur, initial encounter for closed fracture: Secondary | ICD-10-CM | POA: Diagnosis not present

## 2016-07-27 DIAGNOSIS — N184 Chronic kidney disease, stage 4 (severe): Secondary | ICD-10-CM | POA: Diagnosis not present

## 2016-07-27 DIAGNOSIS — E860 Dehydration: Secondary | ICD-10-CM | POA: Diagnosis not present

## 2016-07-27 DIAGNOSIS — I4891 Unspecified atrial fibrillation: Secondary | ICD-10-CM | POA: Diagnosis not present

## 2016-07-27 DIAGNOSIS — N183 Chronic kidney disease, stage 3 (moderate): Secondary | ICD-10-CM | POA: Diagnosis not present

## 2016-07-27 DIAGNOSIS — R5383 Other fatigue: Secondary | ICD-10-CM | POA: Diagnosis present

## 2016-07-27 DIAGNOSIS — T8130XA Disruption of wound, unspecified, initial encounter: Secondary | ICD-10-CM

## 2016-07-27 DIAGNOSIS — Z7901 Long term (current) use of anticoagulants: Secondary | ICD-10-CM | POA: Diagnosis not present

## 2016-07-27 DIAGNOSIS — D649 Anemia, unspecified: Secondary | ICD-10-CM | POA: Diagnosis not present

## 2016-07-27 DIAGNOSIS — R262 Difficulty in walking, not elsewhere classified: Secondary | ICD-10-CM | POA: Diagnosis not present

## 2016-07-27 DIAGNOSIS — R635 Abnormal weight gain: Secondary | ICD-10-CM | POA: Diagnosis not present

## 2016-07-27 DIAGNOSIS — I509 Heart failure, unspecified: Secondary | ICD-10-CM | POA: Diagnosis not present

## 2016-07-27 DIAGNOSIS — I130 Hypertensive heart and chronic kidney disease with heart failure and stage 1 through stage 4 chronic kidney disease, or unspecified chronic kidney disease: Secondary | ICD-10-CM | POA: Diagnosis not present

## 2016-07-27 DIAGNOSIS — E1122 Type 2 diabetes mellitus with diabetic chronic kidney disease: Secondary | ICD-10-CM | POA: Diagnosis not present

## 2016-07-27 DIAGNOSIS — R6889 Other general symptoms and signs: Secondary | ICD-10-CM | POA: Diagnosis not present

## 2016-07-27 DIAGNOSIS — M6281 Muscle weakness (generalized): Secondary | ICD-10-CM | POA: Diagnosis not present

## 2016-07-27 DIAGNOSIS — I2510 Atherosclerotic heart disease of native coronary artery without angina pectoris: Secondary | ICD-10-CM | POA: Diagnosis not present

## 2016-07-27 DIAGNOSIS — J189 Pneumonia, unspecified organism: Secondary | ICD-10-CM | POA: Diagnosis not present

## 2016-07-27 DIAGNOSIS — L7632 Postprocedural hematoma of skin and subcutaneous tissue following other procedure: Secondary | ICD-10-CM | POA: Diagnosis not present

## 2016-07-27 DIAGNOSIS — N186 End stage renal disease: Secondary | ICD-10-CM | POA: Diagnosis not present

## 2016-07-27 DIAGNOSIS — S71002A Unspecified open wound, left hip, initial encounter: Secondary | ICD-10-CM | POA: Diagnosis not present

## 2016-07-27 DIAGNOSIS — I252 Old myocardial infarction: Secondary | ICD-10-CM | POA: Diagnosis not present

## 2016-07-27 DIAGNOSIS — R4189 Other symptoms and signs involving cognitive functions and awareness: Secondary | ICD-10-CM | POA: Diagnosis not present

## 2016-07-27 DIAGNOSIS — R0602 Shortness of breath: Secondary | ICD-10-CM | POA: Diagnosis not present

## 2016-07-27 DIAGNOSIS — I5022 Chronic systolic (congestive) heart failure: Secondary | ICD-10-CM | POA: Diagnosis not present

## 2016-07-27 DIAGNOSIS — Z7401 Bed confinement status: Secondary | ICD-10-CM | POA: Diagnosis not present

## 2016-07-27 LAB — CBC
HCT: 29.3 % — ABNORMAL LOW (ref 40.0–52.0)
Hemoglobin: 9.8 g/dL — ABNORMAL LOW (ref 13.0–18.0)
MCH: 30.8 pg (ref 26.0–34.0)
MCHC: 33.6 g/dL (ref 32.0–36.0)
MCV: 91.7 fL (ref 80.0–100.0)
PLATELETS: 271 10*3/uL (ref 150–440)
RBC: 3.19 MIL/uL — ABNORMAL LOW (ref 4.40–5.90)
RDW: 17.2 % — AB (ref 11.5–14.5)
WBC: 8.2 10*3/uL (ref 3.8–10.6)

## 2016-07-27 LAB — BASIC METABOLIC PANEL
Anion gap: 4 — ABNORMAL LOW (ref 5–15)
BUN: 47 mg/dL — AB (ref 6–20)
CALCIUM: 9.6 mg/dL (ref 8.9–10.3)
CO2: 33 mmol/L — ABNORMAL HIGH (ref 22–32)
CREATININE: 1.89 mg/dL — AB (ref 0.61–1.24)
Chloride: 103 mmol/L (ref 101–111)
GFR calc Af Amer: 39 mL/min — ABNORMAL LOW (ref >60)
GFR, EST NON AFRICAN AMERICAN: 34 mL/min — AB (ref >60)
Glucose, Bld: 121 mg/dL — ABNORMAL HIGH (ref 65–99)
Potassium: 3.6 mmol/L (ref 3.5–5.1)
SODIUM: 140 mmol/L (ref 135–145)

## 2016-07-27 LAB — HEMOGLOBIN: HEMOGLOBIN: 10.6 g/dL — AB (ref 13.0–18.0)

## 2016-07-27 LAB — TROPONIN I: TROPONIN I: 0.07 ng/mL — AB (ref <0.03)

## 2016-07-27 LAB — PROTIME-INR
INR: 1.52
Prothrombin Time: 18.5 seconds — ABNORMAL HIGH (ref 11.4–15.2)

## 2016-07-27 MED ORDER — HYDROCODONE-ACETAMINOPHEN 10-325 MG PO TABS
1.0000 | ORAL_TABLET | Freq: Four times a day (QID) | ORAL | 0 refills | Status: AC | PRN
Start: 2016-07-27 — End: ?

## 2016-07-27 MED ORDER — DOCUSATE SODIUM 100 MG PO CAPS: 100.0000 mg | ORAL_CAPSULE | Freq: Two times a day (BID) | ORAL | 0 refills | Status: AC

## 2016-07-27 MED ORDER — ALPRAZOLAM 0.5 MG PO TABS: 0.5000 mg | ORAL_TABLET | Freq: Three times a day (TID) | ORAL | 0 refills | Status: AC

## 2016-07-27 MED ORDER — NEPRO/CARBSTEADY PO LIQD: 237.0000 mL | Freq: Three times a day (TID) | ORAL | 5 refills | Status: DC

## 2016-07-27 NOTE — Progress Notes (Signed)
Pts. 3rd troponon came back as 0.07. Dr. Jannifer Franklin notified and no new orders at this time.

## 2016-07-27 NOTE — Progress Notes (Signed)
Patient is medically stable for D/C back to [facility] today. Per Seth Bake admissions coordinator at [facility] patient will go to room 329. Per [facility] will have a wound vac delivered to the facility today around lunch time. Dr. Rudene Christians placed wound vac on patient at bedside today. Patient will go to [facility] with hospital wound vac and [facility] will attach there wound vac to patient. Per Seth Bake she will bring hospital wound vac back to [facility] today. Ortho Nursing Director is aware of above. RN will call report at [phone] and arrange EMS for transport. Clinical Education officer, museum (CSW) sent D/C orders to [employer] via [employer]. Patient is aware of above. Patient's daughter Dalene Seltzer is at bedside and aware of above. Please reconsult if future social work needs arise. CSW signing off.   [employer], LCSW [phone]

## 2016-07-27 NOTE — Progress Notes (Signed)
Wound vac applied to proximal wound that had dehiscence, hematoma present and hope wound vac will diminish that and help with wound healing.

## 2016-07-27 NOTE — Discharge Summary (Signed)
[facility] [facility] [place] NAME: Micheal [name]    MR#:  [id number]  DATE OF BIRTH:  1941-04-25  DATE OF ADMISSION:  07/26/2016 ADMITTING PHYSICIAN: Theodoro Grist, MD  DATE OF DISCHARGE: No discharge date for patient encounter.  PRIMARY CARE PHYSICIAN: [name], MD     ADMISSION DIAGNOSIS:  Hemorrhage [R58] Current use of long term anticoagulation [Z79.01] Hematoma of hip, left, initial encounter [S70.02XA] Acute kidney injury superimposed on chronic kidney disease ([place]) [N17.9, N18.9]  DISCHARGE DIAGNOSIS:  Active Problems:   Hematoma   Wound dehiscence   Acute on chronic renal failure (HCC)   Dehydration   SECONDARY DIAGNOSIS:   Past Medical History:  Diagnosis Date  . Anxiety   . Atrial fibrillation ([place])   . 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 CAD (coronary artery disease)   . CHF (congestive heart failure) ([place])   . Chronic back pain   . Chronic systolic heart failure (HCC) ~2009   EF under 25%  . CKD (chronic kidney disease) stage 3, GFR 30-59 ml/min 2009   stage III  . Dyspnea    with exertion  . Fracture closed, humerus    never hadsurgery, left  . Gout, unspecified   . Hypertension   . Myocardial infarction    pt is unaware of this but doctors say he has  . Other left bundle branch block   . Thrombocytopenia ([place])     .pro HOSPITAL COURSE:   The patient is 75 year old Caucasian male with medical history significant for history of multiple medical problems including chronic systolic CHF, CK D stage III, hypertension, Coronary artery disease, who underwent intramedullary nail placement on 07/24/2016 for intertrochanteric hip fracture of left femur, who presents to the hospital with complaints of bleeding from incision site. The hematoma was noted. Patient was seen by Dr. Benay Pike and wound VAC was placed. Lovenox was stopped. Patient is to continue Coumadin loading. His labs  revealed slightly worse kidney function with creatinine level of 2.28 and estimated GFR of 27. Lasix was stopped and his kidney function somewhat improved, creatinine of 1.89 and estimated GFR of 34 on the day of discharge. It is recommended to follow patient's oral intake and make decisions about diuretic use judicially as outpatient, reinitiate the patient on Lasix if he is fluid overloaded . It is also recommended to follow patient's hemoglobin level closely. Patient did not have significantly worsening anemia, hemoglobin level was 10.6 on the day of discharge, it was 11.1 on the day of admission.  Discussion by problem: #1. Wound dehiscence and resultant tissue hematoma, status post wound VAC placement by Dr. Benay Pike on November, 2017 . Discontinue Lovenox, which the patient for significant bleeding while loading Coumadin #2 anemia, no significant drop with bleeding, follow patient's hemoglobin level closely as outpatient, continue iron supplementation #3 dehydration due to bleeding and poor oral intake, suspended Lasix, continue Nepro or other nutritional supplements, resume patient's Lasix when and if the patient is fluid overloaded #4. Acute on chronic renal failure, improved with some IV fluids and suspension of Lasix, creatinine is 1.89 on the day of discharge, estimated GFR 34.  DISCHARGE CONDITIONS:   Stable  CONSULTS OBTAINED:  Treatment Team:  Hessie Knows, MD  DRUG ALLERGIES:  No Known Allergies  DISCHARGE MEDICATIONS:   Current Discharge Medication List    START taking these medications   Details  docusate sodium (COLACE) 100 MG capsule Take 1  capsule (100 mg total) by mouth 2 (two) times daily. Qty: 10 capsule, Refills: 0    Nutritional Supplements (FEEDING SUPPLEMENT, NEPRO CARB STEADY,) LIQD Take 237 mLs by mouth 3 (three) times daily between meals. Qty: 90 Can, Refills: 5      CONTINUE these medications which have CHANGED   Details  ALPRAZolam (XANAX) 0.5 MG tablet  Take 1 tablet (0.5 mg total) by mouth 3 (three) times daily. Qty: 30 tablet, Refills: 0    HYDROcodone-acetaminophen (NORCO) 10-325 MG tablet Take 1 tablet by mouth every 6 (six) hours as needed. Qty: 16 tablet, Refills: 0      CONTINUE these medications which have NOT CHANGED   Details  alum & mag hydroxide-simeth (MAALOX/MYLANTA) 200-200-20 MG/5ML suspension Take 30 mLs by mouth every 4 (four) hours as needed for indigestion. Qty: 355 mL, Refills: 0    atorvastatin (LIPITOR) 20 MG tablet Take 20 mg by mouth daily.    calcitRIOL (ROCALTROL) 0.25 MCG capsule Take 0.25 mcg by mouth daily.    carvedilol (COREG) 3.125 MG tablet TAKE ONE TABLET BY MOUTH TWICE DAILY WITH A MEAL Qty: 180 tablet, Refills: 3    citalopram (CELEXA) 40 MG tablet TAKE ONE TABLET BY MOUTH ONCE DAILY Qty: 90 tablet, Refills: 3    febuxostat (ULORIC) 40 MG tablet Take 2 tablets (80 mg total) by mouth daily. Qty: 180 tablet, Refills: 3    ferrous sulfate 325 (65 FE) MG tablet Take 1 tablet (325 mg total) by mouth 2 (two) times daily with a meal. Refills: 3    hydrOXYzine (ATARAX/VISTARIL) 50 MG tablet TAKE ONE TABLET BY MOUTH THREE TIMES DAILY AS NEEDED Qty: 270 tablet, Refills: 0    ipratropium-albuterol (DUONEB) 0.5-2.5 (3) MG/3ML SOLN Take 3 mLs by nebulization every 4 (four) hours as needed. Qty: 360 mL    polyethylene glycol (MIRALAX / GLYCOLAX) packet Take 17 g by mouth daily.    senna (SENOKOT) 8.6 MG TABS tablet Take 1 tablet by mouth at bedtime as needed for mild constipation.    tamsulosin (FLOMAX) 0.4 MG CAPS capsule Take 0.4 mg by mouth.    !! warfarin (COUMADIN) 2.5 MG tablet Take 2.5 mg by mouth daily. Take 2.5mg  every Tuesday, Thursday, Saturday    !! warfarin (COUMADIN) 5 MG tablet Take 5 mg by mouth daily. 5mg  sunday,monday,wednesday,friday 2.5 Tuesday,thursday,saturday Last home dose taken tuesday     !! - Potential duplicate medications found. Please discuss with provider.    STOP  taking these medications     enoxaparin (LOVENOX) 120 MG/0.8ML injection      furosemide (LASIX) 40 MG tablet      potassium chloride (K-DUR) 10 MEQ tablet          DISCHARGE INSTRUCTIONS:    The patient is to follow-up with primary care physician and orthopedist surgeon, Dr. Rudene Christians as outpatient  If you experience worsening of your admission symptoms, develop shortness of breath, life threatening emergency, suicidal or homicidal thoughts you must seek medical attention immediately by calling 911 or calling your MD immediately  if symptoms less severe.  You Must read complete instructions/literature along with all the possible adverse reactions/side effects for all the Medicines you take and that have been prescribed to you. Take any new Medicines after you have completely understood and accept all the possible adverse reactions/side effects.   Please note  You were cared for by a hospitalist during your hospital stay. If you have any questions about your discharge medications or the care  you received while you were in the hospital after you are discharged, you can call the unit and asked to speak with the hospitalist on call if the hospitalist that took care of you is not available. Once you are discharged, your primary care physician will handle any further medical issues. Please note that NO REFILLS for any discharge medications will be authorized once you are discharged, as it is imperative that you return to your primary care physician (or establish a relationship with a primary care physician if you do not have one) for your aftercare needs so that they can reassess your need for medications and monitor your lab values.    Today   CHIEF COMPLAINT:   Chief Complaint  Patient presents with  . Post-op Problem    HISTORY OF PRESENT ILLNESS:  Taz Waggle  is a 75 y.o. male with a known history of multiple medical problems including chronic systolic CHF, CK D stage III,  hypertension, Coronary artery disease, who underwent intramedullary nail placement on 07/24/2016 for intertrochanteric hip fracture of left femur, who presents to the hospital with complaints of bleeding from incision site. The hematoma was noted. Patient was seen by Dr. Benay Pike and wound VAC was placed. Lovenox was stopped. Patient is to continue Coumadin loading. His labs revealed slightly worse kidney function with creatinine level of 2.28 and estimated GFR of 27. Lasix was stopped and his kidney function somewhat improved, creatinine of 1.89 and estimated GFR of 34 on the day of discharge. It is recommended to follow patient's oral intake and make decisions about diuretic use judicially as outpatient, reinitiate the patient on Lasix if he is fluid overloaded . It is also recommended to follow patient's hemoglobin level closely. Patient did not have significantly worsening anemia, hemoglobin level was 10.6 on the day of discharge, it was 11.1 on the day of admission.  Discussion by problem: #1. Wound dehiscence and resultant tissue hematoma, status post wound VAC placement by Dr. Benay Pike on November, 2017 . Discontinue Lovenox, which the patient for significant bleeding while loading Coumadin #2 anemia, no significant drop with bleeding, follow patient's hemoglobin level closely as outpatient, continue iron supplementation #3 dehydration due to bleeding and poor oral intake, suspended Lasix, continue Nepro or other nutritional supplements, resume patient's Lasix when and if the patient is fluid overloaded #4. Acute on chronic renal failure, improved with some IV fluids and suspension of Lasix, creatinine is 1.89 on the day of discharge, estimated GFR 34.    VITAL SIGNS:  Blood pressure 115/70, pulse 74, temperature 98.3 F (36.8 C), temperature source Oral, resp. rate 18, height 5\' 10"  (1.778 m), weight 115.7 kg (255 lb), SpO2 100 %.  I/O:    Intake/Output Summary (Last 24 hours) at 07/27/16  1102 Last data filed at 07/27/16 0949  Gross per 24 hour  Intake          1348.33 ml  Output             1490 ml  Net          -141.67 ml    PHYSICAL EXAMINATION:  GENERAL:  75 y.o.-year-old patient lying in the bed with no acute distress.  EYES: Pupils equal, round, reactive to light and accommodation. No scleral icterus. Extraocular muscles intact.  HEENT: Head atraumatic, normocephalic. Oropharynx and nasopharynx clear.  NECK:  Supple, no jugular venous distention. No thyroid enlargement, no tenderness.  LUNGS: Normal breath sounds bilaterally, no wheezing, rales,rhonchi or crepitation. No use of accessory muscles  of respiration.  CARDIOVASCULAR: S1, S2 normal. No murmurs, rubs, or gallops.  ABDOMEN: Soft, non-tender, non-distended. Bowel sounds present. No organomegaly or mass.  EXTREMITIES: No pedal edema, cyanosis, or clubbing.  NEUROLOGIC: Cranial nerves II through XII are intact. Muscle strength 5/5 in all extremities. Sensation intact. Gait not checked.  PSYCHIATRIC: The patient is alert and oriented x 3.  SKIN: No obvious rash, lesion, or ulcer.   DATA REVIEW:   CBC  Recent Labs Lab 07/27/16 0415 07/27/16 0807  WBC 8.2  --   HGB 9.8* 10.6*  HCT 29.3*  --   PLT 271  --     Chemistries   Recent Labs Lab 07/27/16 0415  NA 140  K 3.6  CL 103  CO2 33*  GLUCOSE 121*  BUN 47*  CREATININE 1.89*  CALCIUM 9.6    Cardiac Enzymes  Recent Labs Lab 07/26/16 2312  TROPONINI 0.07*    Microbiology Results  Results for orders placed or performed during the hospital encounter of 07/05/16  Urine culture     Status: Abnormal   Collection Time: 07/05/16  7:59 AM  Result Value Ref Range Status   Specimen Description URINE, RANDOM  Final   Special Requests NONE  Final   Culture >=100,000 COLONIES/mL MORGANELLA MORGANII (A)  Final   Report Status 07/08/2016 FINAL  Final   Organism ID, Bacteria MORGANELLA MORGANII (A)  Final      Susceptibility   Morganella  morganii - MIC*    AMPICILLIN >=32 RESISTANT Resistant     CEFAZOLIN >=64 RESISTANT Resistant     CEFTRIAXONE <=1 SENSITIVE Sensitive     CIPROFLOXACIN <=0.25 SENSITIVE Sensitive     GENTAMICIN <=1 SENSITIVE Sensitive     IMIPENEM 4 SENSITIVE Sensitive     NITROFURANTOIN 64 RESISTANT Resistant     TRIMETH/SULFA <=20 SENSITIVE Sensitive     AMPICILLIN/SULBACTAM >=32 RESISTANT Resistant     PIP/TAZO <=4 SENSITIVE Sensitive     * >=100,000 COLONIES/mL MORGANELLA MORGANII  Surgical PCR screen     Status: Abnormal   Collection Time: 07/05/16  1:18 PM  Result Value Ref Range Status   MRSA, PCR POSITIVE (A) NEGATIVE Final    Comment: RESULT CALLED TO, READ BACK BY AND VERIFIED WITH: ERIN CARTER ON 07/05/16 AT 1553 [place]    Staphylococcus aureus POSITIVE (A) NEGATIVE Final    Comment:        The Xpert SA Assay (FDA approved for NASAL specimens in patients over 6 years of age), is one component of a comprehensive surveillance program.  Test performance has been validated by [facility] for patients greater than or equal to 36 year old. It is not intended to diagnose infection nor to guide or monitor treatment.     RADIOLOGY:  No results found.  EKG:   Orders placed or performed during the hospital encounter of 07/05/16  . ED EKG  . ED EKG      Management plans discussed with the patient, family and they are in agreement.  CODE STATUS:     Code Status Orders        Start     Ordered   07/26/16 1044  Full code  Continuous     07/26/16 1044    Code Status History    Date Active Date Inactive Code Status Order ID Comments User Context   07/05/2016 12:18 PM 07/17/2016  5:38 PM Full Code [id number]  Gladstone Lighter, MD Inpatient    Advance Directive Documentation  Flowsheet Row Most Recent Value  Type of Pension scheme manager Power of [place], Living will [place]  Pre-existing out of facility DNR order (yellow form or pink MOST form)   No data  "MOST" Form in Place?  No data      TOTAL TIME TAKING CARE OF THIS PATIENT: 40 minutes.    Theodoro Grist M.D on 07/27/2016 at 11:02 AM  Between 7am to 6pm - Pager - [phone]  After 6pm go to [email or web address] - password [email or web address] [facility]  Office  [phone]  CC: Primary care physician; [name], MD

## 2016-07-27 NOTE — Care Management Important Message (Signed)
Important Message  Patient Details  Name: Micheal [name] MRN: [id number] Date of Birth: 1941-08-01   Medicare Important Message Given:  Yes    Jolly Mango, RN 07/27/2016, 11:53 AM

## 2016-07-27 NOTE — Progress Notes (Signed)
Per Seth Bake admissions coordinator at [facility] they have a different wound vac and have to change the dressing in order to put the new wound vac on. Dr. Rudene Christians gave a verbal order to change the dressing every 3 days. [facility] will change dressing on Sunday and attach their wound vac. Per Seth Bake she will deliver wound vac back to [facility] on Monday. Charge RN aware of above. Please reconsult if future social work needs arise. CSW signing off.   [employer], LCSW [phone]

## 2016-07-27 NOTE — Progress Notes (Signed)
Shift assessment completed at 0745. Pt was awake, alert and oriented ot self and place, os soft spoken with flat affect. Pt on O2 at [facility], lungs decreased to bilat bases, hr is irregular, abdomen is soft, bs heard. Pt is on bedpan fequently for voiding and stool. Ppp. Dressing to l hip was intact. Subsequent to assessment, Dr. Rudene Christians rounded on pt and placed wound vac. Pt has now been dc'd back to [facility], this Probation officer gave report to facility at pt left via ems at approx 1300, wound vac intact. Daughter present at bedside when pt left.

## 2016-07-28 DIAGNOSIS — S72012A Unspecified intracapsular fracture of left femur, initial encounter for closed fracture: Secondary | ICD-10-CM | POA: Diagnosis not present

## 2016-08-03 ENCOUNTER — Telehealth: Payer: Self-pay

## 2016-08-03 NOTE — Telephone Encounter (Signed)
I'm sure Dr. Silvio Pate will address this at [facility] today

## 2016-08-03 NOTE — Telephone Encounter (Signed)
PLEASE NOTE: All timestamps contained within this report are represented as [place] Standard Time. CONFIDENTIALTY NOTICE: This fax transmission is intended only for the addressee. It contains information that is legally privileged, confidential or otherwise protected from use or disclosure. If you are not the intended recipient, you are strictly prohibited from reviewing, disclosing, copying using or disseminating any of this information or taking any action in reliance on or regarding this information. If you have received this fax in error, please notify us immediately by telephone so that we can arrange for its return to [place]. Phone: [phone], Toll-Free: [phone], Fax: [phone] Page: 1 of 1 Call Id: [id number] [place] Night - Client Nonclinical Telephone Record [place] Night - Client Client Site [place] Physician Webb Silversmith - NP Contact Type Call Who Is Calling Physician / Provider / Hospital Call Type Provider Call [facility] Page Now Reason for Call Request to speak to Physician Initial Comment Caller says she is calling to report INR results; 1.4 for dr. Garnette Gunner Patient Name Micheal [name] Patient DOB Dec 08, 2040 Requesting Provider Maretta Bees Physician Number [place] Name [facility] Phone DateTime Result/Outcome Message Type Notes Walker Kehr - MD [id number] 08/02/2016 7:37:46 PM Paged On Call Back to Call Center Doctor Paged Walker Kehr - MD [id number] 08/02/2016 7:54:01 PM Paged On Call Back to Call Center Doctor Paged Walker Kehr - MD [id number] 08/02/2016 8:17:59 PM Called On Call Provider - Reached Doctor Paged Walker Kehr - MD 08/02/2016 8:18:59 PM Spoke with On Call - General Message Result Spoke with On Call, information provided and warm conference the call. Call Closed By: Delton Coombes Transaction Date/Time: 08/02/2016 7:25:23 PM (ET)

## 2016-08-06 ENCOUNTER — Emergency Department
Admission: EM | Admit: 2016-08-06 | Discharge: 2016-08-06 | Disposition: A | Discharge status: Home / Self Care | Payer: Medicare Other | Attending: Emergency Medicine | Admitting: Emergency Medicine

## 2016-08-06 ENCOUNTER — Emergency Department: Payer: Medicare Other

## 2016-08-06 DIAGNOSIS — I2510 Atherosclerotic heart disease of native coronary artery without angina pectoris: Secondary | ICD-10-CM | POA: Insufficient documentation

## 2016-08-06 DIAGNOSIS — R635 Abnormal weight gain: Secondary | ICD-10-CM | POA: Insufficient documentation

## 2016-08-06 DIAGNOSIS — N184 Chronic kidney disease, stage 4 (severe): Secondary | ICD-10-CM | POA: Diagnosis not present

## 2016-08-06 DIAGNOSIS — Z87891 Personal history of nicotine dependence: Secondary | ICD-10-CM | POA: Diagnosis not present

## 2016-08-06 DIAGNOSIS — Z7901 Long term (current) use of anticoagulants: Secondary | ICD-10-CM | POA: Insufficient documentation

## 2016-08-06 DIAGNOSIS — I5022 Chronic systolic (congestive) heart failure: Secondary | ICD-10-CM | POA: Diagnosis not present

## 2016-08-06 DIAGNOSIS — J189 Pneumonia, unspecified organism: Secondary | ICD-10-CM | POA: Insufficient documentation

## 2016-08-06 DIAGNOSIS — R0602 Shortness of breath: Secondary | ICD-10-CM | POA: Diagnosis not present

## 2016-08-06 DIAGNOSIS — I130 Hypertensive heart and chronic kidney disease with heart failure and stage 1 through stage 4 chronic kidney disease, or unspecified chronic kidney disease: Secondary | ICD-10-CM | POA: Insufficient documentation

## 2016-08-06 DIAGNOSIS — I252 Old myocardial infarction: Secondary | ICD-10-CM | POA: Insufficient documentation

## 2016-08-06 DIAGNOSIS — Z79899 Other long term (current) drug therapy: Secondary | ICD-10-CM | POA: Insufficient documentation

## 2016-08-06 LAB — BASIC METABOLIC PANEL
ANION GAP: 4 — AB (ref 5–15)
BUN: 41 mg/dL — ABNORMAL HIGH (ref 6–20)
CHLORIDE: 107 mmol/L (ref 101–111)
CO2: 29 mmol/L (ref 22–32)
CREATININE: 1.9 mg/dL — AB (ref 0.61–1.24)
Calcium: 10.3 mg/dL (ref 8.9–10.3)
GFR calc Af Amer: 39 mL/min — ABNORMAL LOW (ref >60)
GFR calc non Af Amer: 33 mL/min — ABNORMAL LOW (ref >60)
Glucose, Bld: 95 mg/dL (ref 65–99)
Potassium: 4.3 mmol/L (ref 3.5–5.1)
SODIUM: 140 mmol/L (ref 135–145)

## 2016-08-06 LAB — CBC
HCT: 34.9 % — ABNORMAL LOW (ref 40.0–52.0)
HEMOGLOBIN: 11.1 g/dL — AB (ref 13.0–18.0)
MCH: 30 pg (ref 26.0–34.0)
MCHC: 32 g/dL (ref 32.0–36.0)
MCV: 93.8 fL (ref 80.0–100.0)
PLATELETS: 206 10*3/uL (ref 150–440)
RBC: 3.72 MIL/uL — AB (ref 4.40–5.90)
RDW: 17.9 % — ABNORMAL HIGH (ref 11.5–14.5)
WBC: 7.5 10*3/uL (ref 3.8–10.6)

## 2016-08-06 LAB — PROTIME-INR
INR: 1.56
Prothrombin Time: 18.8 seconds — ABNORMAL HIGH (ref 11.4–15.2)

## 2016-08-06 LAB — TROPONIN I: TROPONIN I: 0.05 ng/mL — AB (ref <0.03)

## 2016-08-06 MED ORDER — LEVOFLOXACIN 750 MG PO TABS: 750.0000 mg | ORAL_TABLET | Freq: Every day | ORAL | 0 refills | Status: AC

## 2016-08-06 MED ORDER — MORPHINE SULFATE (PF) 4 MG/ML IV SOLN: 4.0000 mg | Freq: Once | INTRAVENOUS | Status: DC

## 2016-08-06 MED ORDER — LEVOFLOXACIN IN D5W 750 MG/150ML IV SOLN
750.0000 mg | Freq: Once | INTRAVENOUS | Status: AC
  Administered 2016-08-06: 750 mg via INTRAVENOUS
  Filled 2016-08-06: qty 150

## 2016-08-06 NOTE — ED Notes (Signed)
Pt given diet coke,crackers and peanut butter.

## 2016-08-06 NOTE — ED Triage Notes (Addendum)
BIB EMS from [facility]. Staff states pt has been lethargic also concerned for fluid in lungs staff from [facility] states pt has had a 6lbs weight gain over night. Per EMS upon arrival pt Alert and Oriented 98% 2 [place] [facility] per EMS 128

## 2016-08-06 NOTE — ED Notes (Signed)
Patient transported to X-ray 

## 2016-08-06 NOTE — Discharge Instructions (Signed)
Please seek medical attention for any high fevers, chest pain, shortness of breath, change in behavior, persistent vomiting, bloody stool or any other new or concerning symptoms.  

## 2016-08-06 NOTE — ED Notes (Signed)
Pt sent back to [facility] via [employer]. Pt alert and oriented X4, active, cooperative, pt in NAD. RR even and unlabored, color WNL.  Daughter taking prescription back to [facility].

## 2016-08-06 NOTE — ED Provider Notes (Signed)
[facility] Emergency Department Provider Note   ____________________________________________   I have reviewed the triage vital signs and the nursing notes.   HISTORY  Chief Complaint Fatigue and Weight Gain   History limited by: Not Limited   HPI Micheal [name] is a 75 y.o. male who presents to the emergency department today from living facility because of concerns for weight gain and abnormal lung auscultation by nurse. Per patient he was told he had a 6 pound weight gain over the course of 24 hours. When the nurse evaluated his lungs she felt like she was hearing fluid throughout the field. The patient denies any shortness of breath or chest pain. He did have a slight cough yesterday. No fevers.   Past Medical History:  Diagnosis Date  . Anxiety   . Atrial fibrillation ([place])   . 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 CAD (coronary artery disease)   . CHF (congestive heart failure) ([place])   . Chronic back pain   . Chronic systolic heart failure (HCC) ~2009   EF under 25%  . CKD (chronic kidney disease) stage 3, GFR 30-59 ml/min 2009   stage III  . Dyspnea    with exertion  . Fracture closed, humerus    never hadsurgery, left  . Gout, unspecified   . Hypertension   . Myocardial infarction    pt is unaware of this but doctors say he has  . Other left bundle branch block   . Thrombocytopenia [facility])     Patient Active Problem List   Diagnosis Date Noted  . Wound dehiscence 07/27/2016  . Dehydration 07/27/2016  . Hematoma 07/26/2016  . Acute on chronic renal failure ([place]) 07/26/2016  . Hip fracture ([place]) 07/05/2016  . Thrombocytopenia ([place])   . Preventative health care 04/15/2015  . Chronic hypoxemic respiratory failure ([place]) 04/15/2015  . BPH (benign prostatic hypertrophy) 09/22/2014  . Encounter for therapeutic drug monitoring 12/17/2013  . Chronic back pain   . Long term (current) use of anticoagulants  11/05/2012  . Hypertension   . Mood disorder ([place])   . 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 Gout, unspecified   . LBBB 03/04/2009  . Atrial fibrillation ([place]) 03/04/2009  . Chronic systolic heart failure ([place]) 03/04/2009  . ICD - IN SITU 03/04/2009    Past Surgical History:  Procedure Laterality Date  . Fracture of left humerus     Rebroken 12/13. never had surgery  . ICD implant     St. Jude [place]; 12/27/07-CHF, afib, LBB, 3 vessel disease  . INTRAMEDULLARY (IM) NAIL INTERTROCHANTERIC Left 07/07/2016   Procedure: INTRAMEDULLARY (IM) NAIL INTERTROCHANTRIC;  Surgeon: Hessie Knows, MD;  Location: [facility] ORS;  Service: Orthopedics;  Laterality: Left;    Prior to Admission medications   Medication Sig Start Date End Date Taking? Authorizing Provider  ALPRAZolam Duanne Moron) 0.5 MG tablet Take 1 tablet (0.5 mg total) by mouth 3 (three) times daily. 07/27/16   Theodoro Grist, MD  alum & mag hydroxide-simeth (MAALOX/MYLANTA) 200-200-20 MG/5ML suspension Take 30 mLs by mouth every 4 (four) hours as needed for indigestion. 07/17/16   [name], MD  atorvastatin (LIPITOR) 20 MG tablet Take 20 mg by mouth daily.    Historical Provider, MD  calcitRIOL (ROCALTROL) 0.25 MCG capsule Take 0.25 mcg by mouth daily.    Historical Provider, MD  carvedilol (COREG) 3.125 MG tablet TAKE ONE TABLET BY MOUTH TWICE DAILY WITH A MEAL  06/26/16   Venia Carbon, MD  citalopram (CELEXA) 40 MG tablet TAKE ONE TABLET BY MOUTH ONCE DAILY 05/01/16   Venia Carbon, MD  docusate sodium (COLACE) 100 MG capsule Take 1 capsule (100 mg total) by mouth 2 (two) times daily. 07/27/16   Theodoro Grist, MD  febuxostat (ULORIC) 40 MG tablet Take 2 tablets (80 mg total) by mouth daily. 01/26/14   Venia Carbon, MD  ferrous sulfate 325 (65 FE) MG tablet Take 1 tablet (325 mg total) by mouth 2 (two) times daily with a meal. 07/17/16   [name], MD  HYDROcodone-acetaminophen [facility]) 10-325 MG tablet Take 1 tablet by mouth  every 6 (six) hours as needed. 07/27/16   Theodoro Grist, MD  hydrOXYzine (ATARAX/VISTARIL) 50 MG tablet TAKE ONE TABLET BY MOUTH THREE TIMES DAILY AS NEEDED Patient taking differently: TAKE ONE TABLET BY MOUTH TWO TIMES DAILY AS NEEDED 04/24/16   Venia Carbon, MD  ipratropium-albuterol (DUONEB) 0.5-2.5 (3) MG/3ML SOLN Take 3 mLs by nebulization every 4 (four) hours as needed. 07/17/16   [name], MD  Nutritional Supplements (FEEDING SUPPLEMENT, NEPRO CARB STEADY,) LIQD Take 237 mLs by mouth 3 (three) times daily between meals. 07/27/16   Theodoro Grist, MD  polyethylene glycol (MIRALAX / GLYCOLAX) packet Take 17 g by mouth daily.    Historical Provider, MD  senna (SENOKOT) 8.6 MG TABS tablet Take 1 tablet by mouth at bedtime as needed for mild constipation.    Historical Provider, MD  tamsulosin (FLOMAX) 0.4 MG CAPS capsule Take 0.4 mg by mouth.    Historical Provider, MD  warfarin (COUMADIN) 2.5 MG tablet Take 2.5 mg by mouth daily. Take 2.5mg  every Tuesday, Thursday, Saturday    Historical Provider, MD  warfarin (COUMADIN) 5 MG tablet Take 5 mg by mouth daily. 5mg  sunday,monday,wednesday,friday 2.5 Tuesday,thursday,saturday Last home dose taken tuesday    Historical Provider, MD    Allergies Patient has no known allergies.  Family History  Problem Relation Age of Onset  . Heart failure Mother   . CAD Father   . Diabetes Mellitus II Father     Social History Social History  Substance Use Topics  . Smoking status: Former Smoker    Quit date: 09/25/1993  . Smokeless tobacco: Never Used     Comment: quit 15 years ago  . Alcohol use No     Comment: past use but not currently    Review of Systems  Constitutional: Negative for fever. Cardiovascular: Negative for chest pain. Respiratory: Negative for shortness of breath. Positive for cough. Gastrointestinal: Negative for abdominal pain, vomiting and diarrhea. Genitourinary: Negative for dysuria. Musculoskeletal: Negative for back  pain. Skin: Negative for rash. Neurological: Negative for headaches, focal weakness or numbness.  10-point ROS otherwise negative.  ____________________________________________   PHYSICAL EXAM:  VITAL SIGNS: ED Triage Vitals  Enc Vitals Group     BP 08/06/16 1038 130/81     Pulse Rate 08/06/16 1038 77     Resp 08/06/16 1038 20     Temp 08/06/16 1038 98 F (36.7 C)     Temp Source 08/06/16 1038 Oral     SpO2 08/06/16 1033 100 %     Weight 08/06/16 1032 239 lb (108.4 kg)     Height 08/06/16 1032 5\' 10"  (1.778 m)   Constitutional: Alert and oriented. Well appearing and in no distress. Eyes: Conjunctivae are normal. Normal extraocular movements. ENT   Head: Normocephalic and atraumatic.   Nose: No congestion/rhinnorhea.  Mouth/Throat: Mucous membranes are moist.   Neck: No stridor. Hematological/Lymphatic/Immunilogical: No cervical lymphadenopathy. Cardiovascular: Normal rate, regular rhythm.  No murmurs, rubs, or gallops.  Respiratory: Normal respiratory effort without tachypnea nor retractions. Breath sounds are clear and equal bilaterally. No wheezes/rales/rhonchi. Gastrointestinal: Soft and nontender. No distention.  Genitourinary: Deferred Musculoskeletal: Normal range of motion in all extremities. No lower extremity edema. Neurologic:  Awake, alert and oriented. Slightly slow speech.  Skin:  Skin is warm, dry and intact. No rash noted. Psychiatric: Mood and affect are normal. Speech and behavior are normal. Patient exhibits appropriate insight and judgment.  ____________________________________________    LABS (pertinent positives/negatives)  Labs Reviewed  BASIC METABOLIC PANEL - Abnormal; Notable for the following:       Result Value   BUN 41 (*)    Creatinine, Ser 1.90 (*)    GFR calc non Af Amer 33 (*)    GFR calc Af Amer 39 (*)    Anion gap 4 (*)    All other components within normal limits  CBC - Abnormal; Notable for the following:    RBC  3.72 (*)    Hemoglobin 11.1 (*)    HCT 34.9 (*)    RDW 17.9 (*)    All other components within normal limits  TROPONIN I - Abnormal; Notable for the following:    Troponin I 0.05 (*)    All other components within normal limits  PROTIME-INR - Abnormal; Notable for the following:    Prothrombin Time 18.8 (*)    All other components within normal limits     ____________________________________________   EKG  I, Nance Pear, attending physician, personally viewed and interpreted this EKG  EKG Time: 1030 Rate: 75 Rhythm: ventricular paced complexes Axis: normal Intervals: qtc 500 QRS: wide ST changes: no st elevation Impression: abnormal ekg  ____________________________________________    RADIOLOGY  CXR IMPRESSION:  Opacity seen in the lungs on only the lateral view. Pneumonia or  aspiration not excluded. Recommend follow-up to resolution.     ____________________________________________   PROCEDURES  Procedures  ____________________________________________   INITIAL IMPRESSION / ASSESSMENT AND PLAN / ED COURSE  Pertinent labs & imaging results that were available during my care of the patient were reviewed by me and considered in my medical decision making (see chart for details).  Patient presented to the emergency department today because of concern for possible weight gain and abnormal findings on lung auscultation by nursing staff living facility. Patient states he has had a small cough although no chest pain, shortness breath or fever. Chest x-ray here did show a slightly concerning spot for possible pneumonia. Patient's without leukocytosis. I discussed this finding with the patient. I also discussed with patient that given possible pneumonia and recent hospitalization he would qualify for healthcare associated pneumonia. Did explain that this can cause you to have much harder to treat pneumonias. Did offer patient admission and did state that that was  the recommendation. She however felt comfortable and desired to go home. This point I think this is not completely unreasonable given lack of leukocytosis or concerning fever, pulse rate blood pressure or oxygenation. Will discharge with antibiotics. Did instruct patient return if symptoms get worse. ____________________________________________   FINAL CLINICAL IMPRESSION(S) / ED DIAGNOSES  Final diagnoses:  Community acquired pneumonia, unspecified laterality     Note: This dictation was prepared with Sales executive. Any transcriptional errors that result from this process are unintentional    Nance Pear, MD 08/06/16 1749

## 2016-08-07 ENCOUNTER — Telehealth: Payer: Self-pay

## 2016-08-07 NOTE — Telephone Encounter (Signed)
PLEASE NOTE: All timestamps contained within this report are represented as [place] Standard Time. CONFIDENTIALTY NOTICE: This fax transmission is intended only for the addressee. It contains information that is legally privileged, confidential or otherwise protected from use or disclosure. If you are not the intended recipient, you are strictly prohibited from reviewing, disclosing, copying using or disseminating any of this information or taking any action in reliance on or regarding this information. If you have received this fax in error, please notify us immediately by telephone so that we can arrange for its return to [place]. Phone: [phone], Toll-Free: [phone], Fax: [phone] Page: 1 of 1 Call Id: [id number] [place] Day - Client Nonclinical Telephone Record [place] Day - Client Client Site [place] - Day Physician [name] - MD Contact Type Call Who Is Calling Physician / Provider / Hospital Call Type Provider Call Message Only Reason for Call Request to speak to Physician Initial Comment Jaymes Graff RN, [place] states she would like to speak with this MD. Patient Name Ronan Cochell Patient DOB June 11, 1941 Requesting Provider Colletta [place] RN Physician Number [phone] Facility Name [facility] Call Closed By: Rosana Fret Transaction Date/Time: 08/06/2016 8:54:31 AM (ET)

## 2016-08-07 NOTE — Telephone Encounter (Signed)
Per chart review tab pt was seen [facility] ED on 08/06/16.

## 2016-08-07 NOTE — Telephone Encounter (Signed)
This has been adressed 

## 2016-08-07 NOTE — Telephone Encounter (Signed)
PLEASE NOTE: All timestamps contained within this report are represented as [place] Standard Time. CONFIDENTIALTY NOTICE: This fax transmission is intended only for the addressee. It contains information that is legally privileged, confidential or otherwise protected from use or disclosure. If you are not the intended recipient, you are strictly prohibited from reviewing, disclosing, copying using or disseminating any of this information or taking any action in reliance on or regarding this information. If you have received this fax in error, please notify us immediately by telephone so that we can arrange for its return to [place]. Phone: [phone], Toll-Free: [phone], Fax: [phone] Page: 1 of 1 Call Id: [id number] [place] Night - Client Nonclinical Telephone Record [place] Night - Client Client Site [place] Physician [name] - MD Contact Type Call Who Is Calling Physician / Provider / Hospital Call Type Provider Call [facility] Page Now Reason for Call Request to speak to Physician Initial Comment Caller says, heart patient has gained 6 lbs since yesterday , stage 4 renal disease, with heart failure Patient Name Micheal [name] Patient DOB [birth year] Requesting Provider Jaymes Graff RN Physician Number [phone] Facility Name [facility] Phone DateTime Result/Outcome Message Type Notes Carolann Littler - MD [id number] 08/06/2016 9:09:17 AM Paged On Call to Other Provider Doctor Paged [facility]: Please call Jaymes Graff at [phone] regarding [place]. Carolann Littler - MD 08/06/2016 9:09:28 AM Paged On Call to Another Provider Message Result Call Closed By: Dalia Heading Transaction Date/Time: 08/06/2016 9:02:05 AM (ET)

## 2016-08-09 ENCOUNTER — Telehealth: Payer: Self-pay | Admitting: Internal Medicine

## 2016-08-09 NOTE — Telephone Encounter (Signed)
Paulina at [facility] called 08/09/16 [phone]  States that Micheal [name] is on coumadin.  Had pt/inr drawn today.  No results back yet.  She wanted to know if he was to take his coumadin tonight.  In reviewing, she reports INR on 10/03/15 - 1.4.  Coumadin adjusted.  His coumadin was adjusted to - 5mg  coumadin 6 days per week and off on Saturday.  ER INR was 1.56 on 10/07/15.  [employer].  They have no result yet.  Informed her to continue the current coumadin dose while awaiting results from today.  She will f/u tomorrow with [facility] to see if can get results.

## 2016-08-10 ENCOUNTER — Telehealth: Payer: Self-pay | Admitting: *Deleted

## 2016-08-10 NOTE — Telephone Encounter (Signed)
[place] Night - Client Nonclinical Telephone Record [place] Night - Client Client Site [place] Physician [name] - MD Contact Type Call Who Is Calling Physician / Provider / Hospital Call Type Provider Call [facility] Page Now Reason for Call Request to speak to Physician Initial Comment Paulina with [facility], cb# [phone]. Caller said they drew for Coumadin today but has not heard back from anyone. She needs to know what to do with the PT. Additional Comment Patient Name Micheal [name] Patient DOB September 18, 1941 Requesting Provider Paulina Physician Number [phone] Facility Name [facility] Paging DoctorName Phone DateTime Result/Outcome Message Type Notes Einar Pheasant - MD [id number] 08/09/2016 7:38:39 PM Called On Call Provider - Reached Doctor Paged Einar Pheasant - MD 08/09/2016 7:40:21 PM Spoke with On Call - General Message Result Spoke with On Call, information provided and warm conference the call. Call Closed By: Delton Coombes Transaction Date/Time: 08/09/2016 7:26:06 PM (ET)

## 2016-08-10 NOTE — Telephone Encounter (Signed)
See other note

## 2016-08-10 NOTE — Telephone Encounter (Signed)
I am at [facility] and the result is in now Only 1.5 Will increase the coumadin

## 2016-08-11 ENCOUNTER — Telehealth: Payer: Self-pay

## 2016-08-11 DIAGNOSIS — J189 Pneumonia, unspecified organism: Secondary | ICD-10-CM | POA: Diagnosis not present

## 2016-08-11 DIAGNOSIS — R062 Wheezing: Secondary | ICD-10-CM | POA: Diagnosis not present

## 2016-08-11 DIAGNOSIS — Z967 Presence of other bone and tendon implants: Secondary | ICD-10-CM | POA: Diagnosis not present

## 2016-08-11 DIAGNOSIS — D649 Anemia, unspecified: Secondary | ICD-10-CM | POA: Diagnosis not present

## 2016-08-11 DIAGNOSIS — I5022 Chronic systolic (congestive) heart failure: Secondary | ICD-10-CM | POA: Diagnosis not present

## 2016-08-11 DIAGNOSIS — F419 Anxiety disorder, unspecified: Secondary | ICD-10-CM | POA: Diagnosis not present

## 2016-08-11 DIAGNOSIS — Z8781 Personal history of (healed) traumatic fracture: Secondary | ICD-10-CM | POA: Diagnosis not present

## 2016-08-11 DIAGNOSIS — R29898 Other symptoms and signs involving the musculoskeletal system: Secondary | ICD-10-CM | POA: Diagnosis not present

## 2016-08-11 DIAGNOSIS — N189 Chronic kidney disease, unspecified: Secondary | ICD-10-CM | POA: Diagnosis not present

## 2016-08-11 DIAGNOSIS — F339 Major depressive disorder, recurrent, unspecified: Secondary | ICD-10-CM | POA: Diagnosis not present

## 2016-08-11 DIAGNOSIS — D320 Benign neoplasm of cerebral meninges: Secondary | ICD-10-CM | POA: Diagnosis not present

## 2016-08-11 DIAGNOSIS — I10 Essential (primary) hypertension: Secondary | ICD-10-CM | POA: Diagnosis not present

## 2016-08-11 DIAGNOSIS — M545 Low back pain: Secondary | ICD-10-CM | POA: Diagnosis not present

## 2016-08-11 DIAGNOSIS — R5381 Other malaise: Secondary | ICD-10-CM | POA: Diagnosis not present

## 2016-08-11 DIAGNOSIS — Z9181 History of falling: Secondary | ICD-10-CM | POA: Diagnosis not present

## 2016-08-11 DIAGNOSIS — N401 Enlarged prostate with lower urinary tract symptoms: Secondary | ICD-10-CM | POA: Diagnosis not present

## 2016-08-11 DIAGNOSIS — I5020 Unspecified systolic (congestive) heart failure: Secondary | ICD-10-CM | POA: Diagnosis not present

## 2016-08-11 DIAGNOSIS — G3184 Mild cognitive impairment, so stated: Secondary | ICD-10-CM | POA: Diagnosis not present

## 2016-08-11 DIAGNOSIS — J9611 Chronic respiratory failure with hypoxia: Secondary | ICD-10-CM | POA: Diagnosis not present

## 2016-08-11 DIAGNOSIS — E784 Other hyperlipidemia: Secondary | ICD-10-CM | POA: Diagnosis not present

## 2016-08-11 DIAGNOSIS — F333 Major depressive disorder, recurrent, severe with psychotic symptoms: Secondary | ICD-10-CM | POA: Diagnosis not present

## 2016-08-11 DIAGNOSIS — J399 Disease of upper respiratory tract, unspecified: Secondary | ICD-10-CM | POA: Diagnosis not present

## 2016-08-11 DIAGNOSIS — N2581 Secondary hyperparathyroidism of renal origin: Secondary | ICD-10-CM | POA: Diagnosis not present

## 2016-08-11 DIAGNOSIS — Z471 Aftercare following joint replacement surgery: Secondary | ICD-10-CM | POA: Diagnosis not present

## 2016-08-11 DIAGNOSIS — M6259 Muscle wasting and atrophy, not elsewhere classified, multiple sites: Secondary | ICD-10-CM | POA: Diagnosis not present

## 2016-08-11 DIAGNOSIS — Z87898 Personal history of other specified conditions: Secondary | ICD-10-CM | POA: Diagnosis not present

## 2016-08-11 DIAGNOSIS — Z4789 Encounter for other orthopedic aftercare: Secondary | ICD-10-CM | POA: Diagnosis not present

## 2016-08-11 DIAGNOSIS — Z741 Need for assistance with personal care: Secondary | ICD-10-CM | POA: Diagnosis not present

## 2016-08-11 DIAGNOSIS — D496 Neoplasm of unspecified behavior of brain: Secondary | ICD-10-CM | POA: Diagnosis not present

## 2016-08-11 DIAGNOSIS — G252 Other specified forms of tremor: Secondary | ICD-10-CM | POA: Diagnosis not present

## 2016-08-11 DIAGNOSIS — Z95810 Presence of automatic (implantable) cardiac defibrillator: Secondary | ICD-10-CM | POA: Diagnosis not present

## 2016-08-11 DIAGNOSIS — I509 Heart failure, unspecified: Secondary | ICD-10-CM | POA: Diagnosis not present

## 2016-08-11 DIAGNOSIS — J159 Unspecified bacterial pneumonia: Secondary | ICD-10-CM | POA: Diagnosis not present

## 2016-08-11 DIAGNOSIS — N400 Enlarged prostate without lower urinary tract symptoms: Secondary | ICD-10-CM | POA: Diagnosis not present

## 2016-08-11 DIAGNOSIS — Z96642 Presence of left artificial hip joint: Secondary | ICD-10-CM | POA: Diagnosis not present

## 2016-08-11 DIAGNOSIS — R0602 Shortness of breath: Secondary | ICD-10-CM | POA: Diagnosis not present

## 2016-08-11 DIAGNOSIS — R41841 Cognitive communication deficit: Secondary | ICD-10-CM | POA: Diagnosis not present

## 2016-08-11 DIAGNOSIS — R262 Difficulty in walking, not elsewhere classified: Secondary | ICD-10-CM | POA: Diagnosis not present

## 2016-08-11 DIAGNOSIS — N184 Chronic kidney disease, stage 4 (severe): Secondary | ICD-10-CM | POA: Diagnosis not present

## 2016-08-11 DIAGNOSIS — I4891 Unspecified atrial fibrillation: Secondary | ICD-10-CM | POA: Diagnosis not present

## 2016-08-11 DIAGNOSIS — R1312 Dysphagia, oropharyngeal phase: Secondary | ICD-10-CM | POA: Diagnosis not present

## 2016-08-11 DIAGNOSIS — M1A9XX0 Chronic gout, unspecified, without tophus (tophi): Secondary | ICD-10-CM | POA: Diagnosis not present

## 2016-08-11 DIAGNOSIS — I130 Hypertensive heart and chronic kidney disease with heart failure and stage 1 through stage 4 chronic kidney disease, or unspecified chronic kidney disease: Secondary | ICD-10-CM | POA: Diagnosis not present

## 2016-08-11 DIAGNOSIS — I2510 Atherosclerotic heart disease of native coronary artery without angina pectoris: Secondary | ICD-10-CM | POA: Diagnosis not present

## 2016-08-11 DIAGNOSIS — F410 Panic disorder [episodic paroxysmal anxiety] without agoraphobia: Secondary | ICD-10-CM | POA: Diagnosis not present

## 2016-08-11 DIAGNOSIS — M109 Gout, unspecified: Secondary | ICD-10-CM | POA: Diagnosis not present

## 2016-08-11 DIAGNOSIS — N183 Chronic kidney disease, stage 3 (moderate): Secondary | ICD-10-CM | POA: Diagnosis not present

## 2016-08-11 NOTE — Telephone Encounter (Signed)
PLEASE NOTE: All timestamps contained within this report are represented as [place] Standard Time. CONFIDENTIALTY NOTICE: This fax transmission is intended only for the addressee. It contains information that is legally privileged, confidential or otherwise protected from use or disclosure. If you are not the intended recipient, you are strictly prohibited from reviewing, disclosing, copying using or disseminating any of this information or taking any action in reliance on or regarding this information. If you have received this fax in error, please notify us immediately by telephone so that we can arrange for its return to [place]. Phone: [phone], Toll-Free: [phone], Fax: [phone] Page: 1 of 1 Call Id: [id number] [place] Night - Client Nonclinical Telephone Record [place] Night - Client Client Site [place] Physician [name] - MD Contact Type Call Who Is Calling Physician / Provider / Hospital Call Type Provider Call [facility] Page Now Reason for Call Request to speak to Physician Initial [place] needs orders for a pt wound management Additional Comment Patient Name Micheal [name] Patient DOB 04/12/1941 Requesting Provider paulina Physician Number [phone] Facility Name [facility] Paging DoctorName Phone DateTime Result/Outcome Message Type Notes Cathlean Cower- MD [id number] 08/10/2016 10:04:26 PM Called On Call Provider - Reached Doctor Paged Cathlean Cower- MD 08/10/2016 10:05:05 PM Spoke with On Call - General Message Result Spoke with On Call, information provided and warm conference the call. Call Closed By: Delton Coombes Transaction Date/Time: 08/10/2016 9:51:47 PM (ET)

## 2016-08-11 NOTE — Telephone Encounter (Signed)
PLEASE NOTE: All timestamps contained within this report are represented as [place] Standard Time. CONFIDENTIALTY NOTICE: This fax transmission is intended only for the addressee. It contains information that is legally privileged, confidential or otherwise protected from use or disclosure. If you are not the intended recipient, you are strictly prohibited from reviewing, disclosing, copying using or disseminating any of this information or taking any action in reliance on or regarding this information. If you have received this fax in error, please notify us immediately by telephone so that we can arrange for its return to [place]. Phone: [phone], Toll-Free: [phone], Fax: [phone] Page: 1 of 1 Call Id: [id number] [place] Night - Client Nonclinical Telephone Record [place] Night - Client Client Site [place] Physician [name] - MD Contact Type Call Who Is Calling Physician / Provider / Hospital Call Type Provider Call [facility] Page Now Reason for Call Request to speak to Physician Initial Comment Paulina RN from [facility] has questions about her patients dressing. Additional Comment Patient Name Eulice Stranz Patient DOB June 12, 1941 Requesting Provider Paulina RN Physician Number [phone] Facility Name [facility] Comments User: Logan Bores Date/Time Eilene Ghazi Time): 08/10/2016 6:50:52 PM Number is verified as what the caller gave. Number is disconnected. Page could not be completed. User: Logan Bores Date/Time Eilene Ghazi Time): 08/10/2016 6:51:[address] In[address] Dr. Jenny Reichmann we would call him back if we do find a correct working number. Paging DoctorName Phone DateTime Result/Outcome Message Type Notes Cathlean Cower- MD [id number] 08/10/2016 6:19:36 PM Called On Call Provider - Left Message Doctor Paged Cathlean Cower- MD [id number] 08/10/2016 6:35:18  PM Called On Call Provider - Left Message Doctor Paged Cathlean Cower- MD 08/10/2016 6:51:52 PM Spoke with On Call - General Message Result Call Closed By: Logan Bores Transaction Date/Time: 08/10/2016 6:12:05 PM (ET)

## 2016-08-11 NOTE — Telephone Encounter (Signed)
Micheal [name] there today--can follow up if there are any residual questions

## 2016-08-11 NOTE — Telephone Encounter (Signed)
Micheal [name] there today

## 2016-08-14 DIAGNOSIS — N189 Chronic kidney disease, unspecified: Secondary | ICD-10-CM | POA: Diagnosis not present

## 2016-08-14 DIAGNOSIS — I509 Heart failure, unspecified: Secondary | ICD-10-CM | POA: Diagnosis not present

## 2016-08-14 DIAGNOSIS — M109 Gout, unspecified: Secondary | ICD-10-CM | POA: Diagnosis not present

## 2016-08-14 DIAGNOSIS — I2510 Atherosclerotic heart disease of native coronary artery without angina pectoris: Secondary | ICD-10-CM | POA: Diagnosis not present

## 2016-08-14 DIAGNOSIS — N400 Enlarged prostate without lower urinary tract symptoms: Secondary | ICD-10-CM | POA: Diagnosis not present

## 2016-08-14 DIAGNOSIS — I4891 Unspecified atrial fibrillation: Secondary | ICD-10-CM | POA: Diagnosis not present

## 2016-08-14 DIAGNOSIS — I130 Hypertensive heart and chronic kidney disease with heart failure and stage 1 through stage 4 chronic kidney disease, or unspecified chronic kidney disease: Secondary | ICD-10-CM | POA: Diagnosis not present

## 2016-08-14 DIAGNOSIS — D649 Anemia, unspecified: Secondary | ICD-10-CM | POA: Diagnosis not present

## 2016-08-22 DIAGNOSIS — I4891 Unspecified atrial fibrillation: Secondary | ICD-10-CM | POA: Diagnosis not present

## 2016-08-22 DIAGNOSIS — D649 Anemia, unspecified: Secondary | ICD-10-CM | POA: Diagnosis not present

## 2016-08-22 DIAGNOSIS — M109 Gout, unspecified: Secondary | ICD-10-CM | POA: Diagnosis not present

## 2016-08-22 DIAGNOSIS — I130 Hypertensive heart and chronic kidney disease with heart failure and stage 1 through stage 4 chronic kidney disease, or unspecified chronic kidney disease: Secondary | ICD-10-CM | POA: Diagnosis not present

## 2016-08-22 DIAGNOSIS — N189 Chronic kidney disease, unspecified: Secondary | ICD-10-CM | POA: Diagnosis not present

## 2016-08-22 DIAGNOSIS — I2510 Atherosclerotic heart disease of native coronary artery without angina pectoris: Secondary | ICD-10-CM | POA: Diagnosis not present

## 2016-08-22 DIAGNOSIS — I509 Heart failure, unspecified: Secondary | ICD-10-CM | POA: Diagnosis not present

## 2016-08-22 DIAGNOSIS — N400 Enlarged prostate without lower urinary tract symptoms: Secondary | ICD-10-CM | POA: Diagnosis not present

## 2016-09-04 DIAGNOSIS — Z967 Presence of other bone and tendon implants: Secondary | ICD-10-CM | POA: Diagnosis not present

## 2016-09-04 DIAGNOSIS — Z8781 Personal history of (healed) traumatic fracture: Secondary | ICD-10-CM | POA: Diagnosis not present

## 2016-09-07 DIAGNOSIS — M109 Gout, unspecified: Secondary | ICD-10-CM | POA: Diagnosis not present

## 2016-09-07 DIAGNOSIS — N183 Chronic kidney disease, stage 3 (moderate): Secondary | ICD-10-CM | POA: Diagnosis not present

## 2016-09-07 DIAGNOSIS — N2581 Secondary hyperparathyroidism of renal origin: Secondary | ICD-10-CM | POA: Diagnosis not present

## 2016-09-07 DIAGNOSIS — I5022 Chronic systolic (congestive) heart failure: Secondary | ICD-10-CM | POA: Diagnosis not present

## 2016-09-11 DIAGNOSIS — F339 Major depressive disorder, recurrent, unspecified: Secondary | ICD-10-CM | POA: Diagnosis not present

## 2016-09-11 DIAGNOSIS — F419 Anxiety disorder, unspecified: Secondary | ICD-10-CM | POA: Diagnosis not present

## 2016-09-11 DIAGNOSIS — F410 Panic disorder [episodic paroxysmal anxiety] without agoraphobia: Secondary | ICD-10-CM | POA: Diagnosis not present

## 2016-09-23 DIAGNOSIS — R5381 Other malaise: Secondary | ICD-10-CM | POA: Diagnosis not present

## 2016-09-23 DIAGNOSIS — J189 Pneumonia, unspecified organism: Secondary | ICD-10-CM | POA: Diagnosis not present

## 2016-10-09 DIAGNOSIS — F333 Major depressive disorder, recurrent, severe with psychotic symptoms: Secondary | ICD-10-CM | POA: Diagnosis not present

## 2016-10-09 DIAGNOSIS — F419 Anxiety disorder, unspecified: Secondary | ICD-10-CM | POA: Diagnosis not present

## 2016-10-09 DIAGNOSIS — F410 Panic disorder [episodic paroxysmal anxiety] without agoraphobia: Secondary | ICD-10-CM | POA: Diagnosis not present

## 2016-10-14 DIAGNOSIS — G3184 Mild cognitive impairment, so stated: Secondary | ICD-10-CM | POA: Diagnosis not present

## 2016-10-14 DIAGNOSIS — D496 Neoplasm of unspecified behavior of brain: Secondary | ICD-10-CM | POA: Diagnosis not present

## 2016-10-18 DIAGNOSIS — G252 Other specified forms of tremor: Secondary | ICD-10-CM | POA: Diagnosis not present

## 2016-10-18 DIAGNOSIS — R29898 Other symptoms and signs involving the musculoskeletal system: Secondary | ICD-10-CM | POA: Diagnosis not present

## 2016-10-18 DIAGNOSIS — Z87898 Personal history of other specified conditions: Secondary | ICD-10-CM | POA: Diagnosis not present

## 2016-10-18 DIAGNOSIS — D320 Benign neoplasm of cerebral meninges: Secondary | ICD-10-CM | POA: Diagnosis not present

## 2016-10-19 ENCOUNTER — Other Ambulatory Visit: Payer: Self-pay | Admitting: Specialist

## 2016-10-19 DIAGNOSIS — R413 Other amnesia: Secondary | ICD-10-CM

## 2016-10-19 DIAGNOSIS — G629 Polyneuropathy, unspecified: Secondary | ICD-10-CM

## 2016-10-19 DIAGNOSIS — R251 Tremor, unspecified: Secondary | ICD-10-CM

## 2016-10-20 ENCOUNTER — Other Ambulatory Visit: Payer: Self-pay | Admitting: Neurology

## 2016-10-20 DIAGNOSIS — R251 Tremor, unspecified: Secondary | ICD-10-CM

## 2016-10-20 DIAGNOSIS — G629 Polyneuropathy, unspecified: Secondary | ICD-10-CM

## 2016-10-20 DIAGNOSIS — R413 Other amnesia: Secondary | ICD-10-CM

## 2016-10-26 ENCOUNTER — Telehealth: Payer: Self-pay | Admitting: Internal Medicine

## 2016-10-26 NOTE — Telephone Encounter (Signed)
Yes---I knew that he would be staying there. You can take me off as PCP

## 2016-10-26 NOTE — Telephone Encounter (Signed)
Patient's daughter,[name],called.  Patient broke his hip in October and is at [facility].  [name] said he'll probably be staying there because therapy isn't going well.  Patient has Dr.Sean Tamala Julian at [facility].

## 2016-10-28 DIAGNOSIS — I2510 Atherosclerotic heart disease of native coronary artery without angina pectoris: Secondary | ICD-10-CM | POA: Diagnosis not present

## 2016-10-28 DIAGNOSIS — I4891 Unspecified atrial fibrillation: Secondary | ICD-10-CM | POA: Diagnosis not present

## 2016-10-28 DIAGNOSIS — E785 Hyperlipidemia, unspecified: Secondary | ICD-10-CM | POA: Diagnosis not present

## 2016-10-28 DIAGNOSIS — F410 Panic disorder [episodic paroxysmal anxiety] without agoraphobia: Secondary | ICD-10-CM | POA: Diagnosis not present

## 2016-10-28 DIAGNOSIS — M1A9XX0 Chronic gout, unspecified, without tophus (tophi): Secondary | ICD-10-CM | POA: Diagnosis not present

## 2016-10-28 DIAGNOSIS — D329 Benign neoplasm of meninges, unspecified: Secondary | ICD-10-CM | POA: Diagnosis not present

## 2016-10-28 DIAGNOSIS — D649 Anemia, unspecified: Secondary | ICD-10-CM | POA: Diagnosis not present

## 2016-10-28 DIAGNOSIS — M545 Low back pain: Secondary | ICD-10-CM | POA: Diagnosis not present

## 2016-10-28 DIAGNOSIS — I6990 Unspecified sequelae of unspecified cerebrovascular disease: Secondary | ICD-10-CM | POA: Diagnosis not present

## 2016-10-28 DIAGNOSIS — N184 Chronic kidney disease, stage 4 (severe): Secondary | ICD-10-CM | POA: Diagnosis not present

## 2016-10-28 DIAGNOSIS — I5022 Chronic systolic (congestive) heart failure: Secondary | ICD-10-CM | POA: Diagnosis not present

## 2016-10-28 DIAGNOSIS — R5381 Other malaise: Secondary | ICD-10-CM | POA: Diagnosis not present

## 2016-10-28 DIAGNOSIS — I509 Heart failure, unspecified: Secondary | ICD-10-CM | POA: Diagnosis not present

## 2016-10-28 DIAGNOSIS — I252 Old myocardial infarction: Secondary | ICD-10-CM | POA: Diagnosis not present

## 2016-10-28 DIAGNOSIS — Z9981 Dependence on supplemental oxygen: Secondary | ICD-10-CM | POA: Diagnosis not present

## 2016-10-28 DIAGNOSIS — Z95810 Presence of automatic (implantable) cardiac defibrillator: Secondary | ICD-10-CM | POA: Diagnosis not present

## 2016-10-28 DIAGNOSIS — N400 Enlarged prostate without lower urinary tract symptoms: Secondary | ICD-10-CM | POA: Diagnosis not present

## 2016-10-28 DIAGNOSIS — Z8781 Personal history of (healed) traumatic fracture: Secondary | ICD-10-CM | POA: Diagnosis not present

## 2016-10-28 DIAGNOSIS — F341 Dysthymic disorder: Secondary | ICD-10-CM | POA: Diagnosis not present

## 2016-10-28 DIAGNOSIS — I130 Hypertensive heart and chronic kidney disease with heart failure and stage 1 through stage 4 chronic kidney disease, or unspecified chronic kidney disease: Secondary | ICD-10-CM | POA: Diagnosis not present

## 2016-10-30 ENCOUNTER — Ambulatory Visit: Payer: Medicare Other | Admitting: Internal Medicine

## 2016-11-03 ENCOUNTER — Ambulatory Visit: Payer: Medicare Other

## 2016-11-23 DEATH — deceased

## 2017-06-12 IMAGING — CT CT HEAD W/O CM
3 series · 15 of 47 positions shown, 18 images · non-contrast
Comparison: 09/16/2012

CLINICAL DATA: Head injury. On Coumadin. Evaluate for intracranial
hemorrhage.

EXAM:
CT HEAD WITHOUT CONTRAST
TECHNIQUE: Contiguous axial images were obtained from the base of the skull
through the vertex without intravenous contrast.

[Series 2: head wo · axial · 0.43mm/px · z∈[-103,+22]mm · 9 of 31 slices shown, 12 images]
[im 3/31  brain]
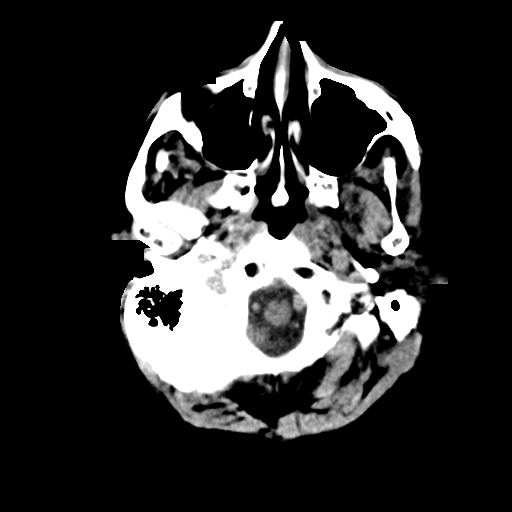
[im 3/31  bone]
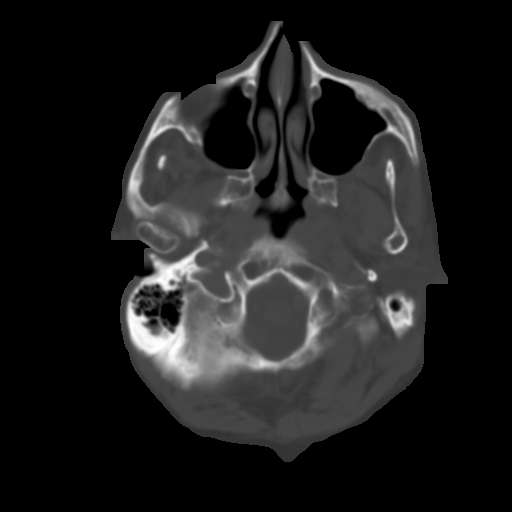
[im 6/31  brain]
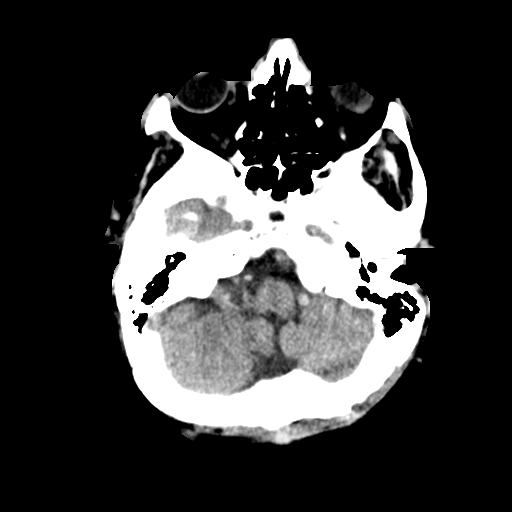
[im 9/31  brain]
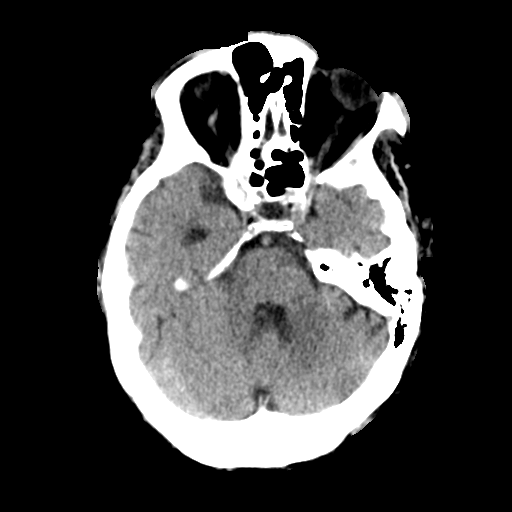
[im 12/31  brain]
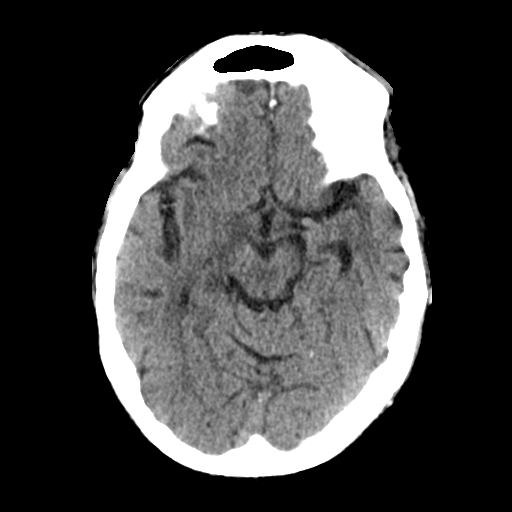
[im 16/31  brain]
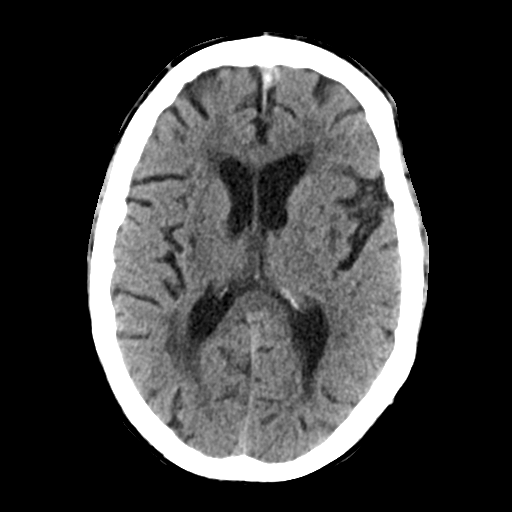
[im 16/31  bone]
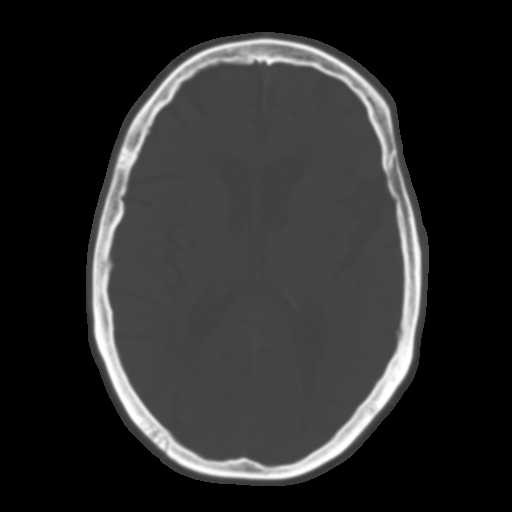
[im 19/31  brain]
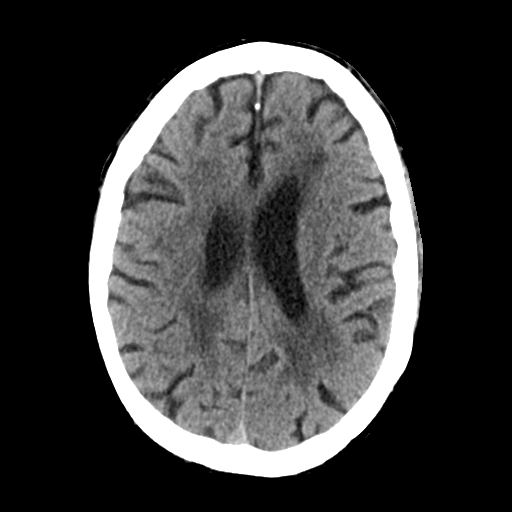
[im 22/31  brain]
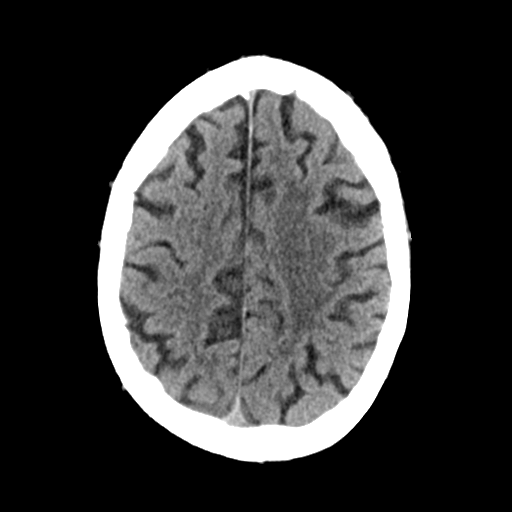
[im 25/31  brain]
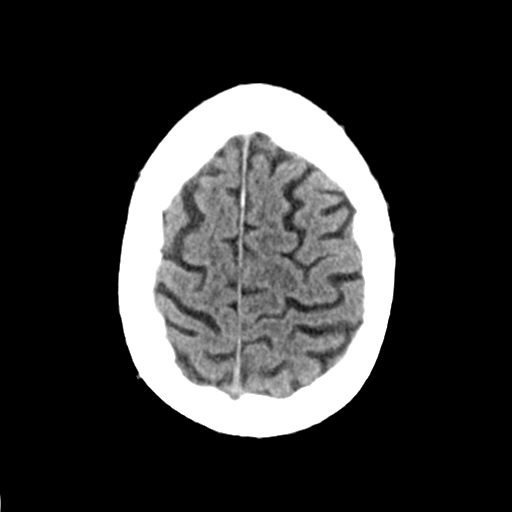
[im 28/31  brain]
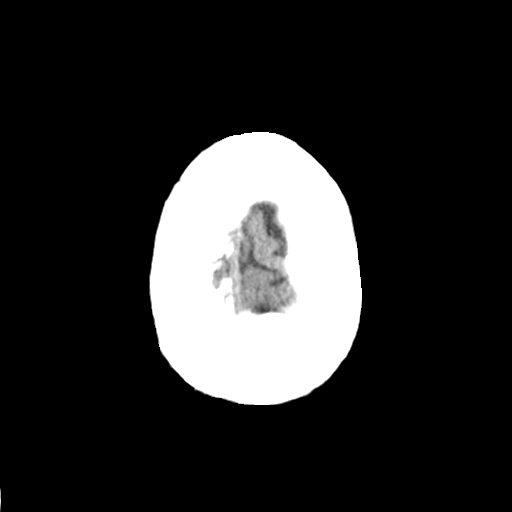
[im 28/31  bone]
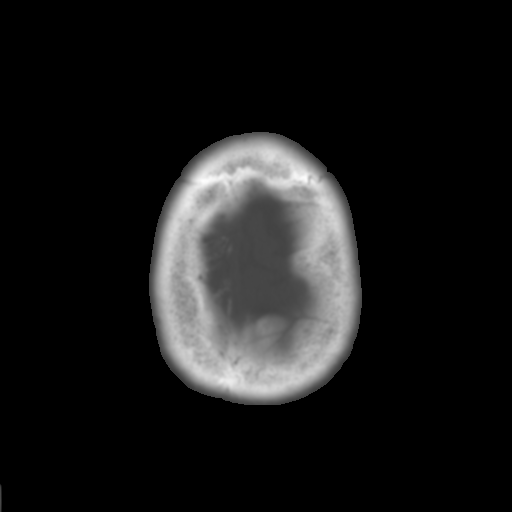

[Series 4: coronal soft tissue · coronal · 0.37mm/px · 3 of 68 slices shown]
[im 23/68  brain]
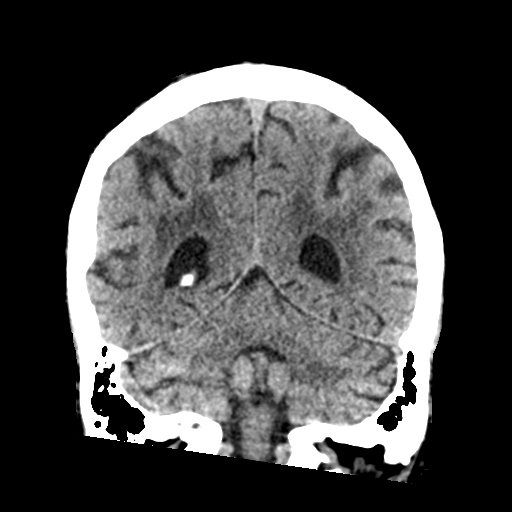
[im 30/68  brain]
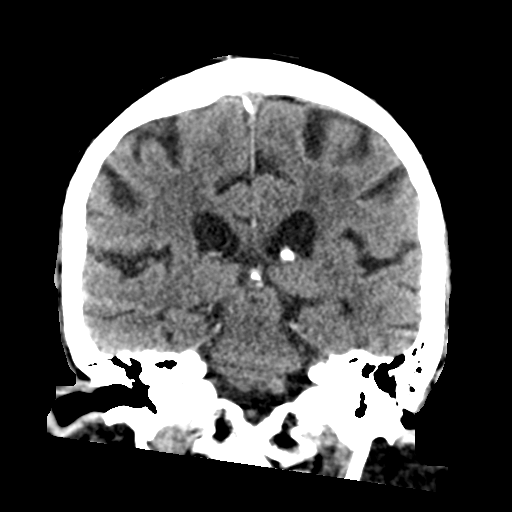
[im 38/68  brain]
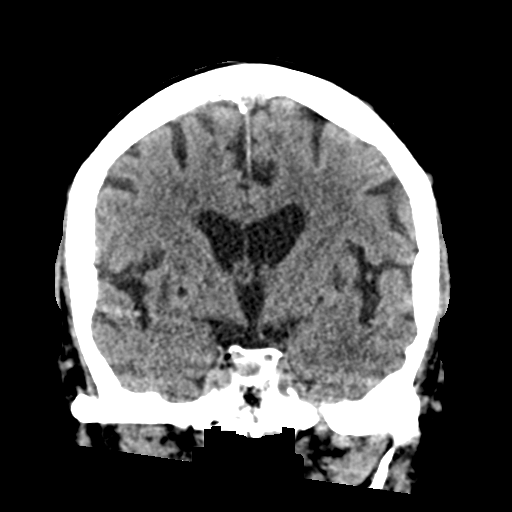

[Series 5: sagittal soft tissue · sagittal · 0.35mm/px · 3 of 56 slices shown]
[im 21/56  brain]
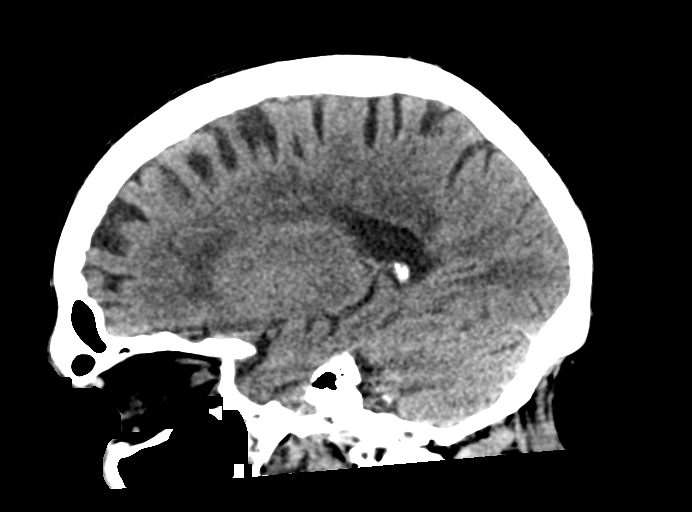
[im 28/56  brain]
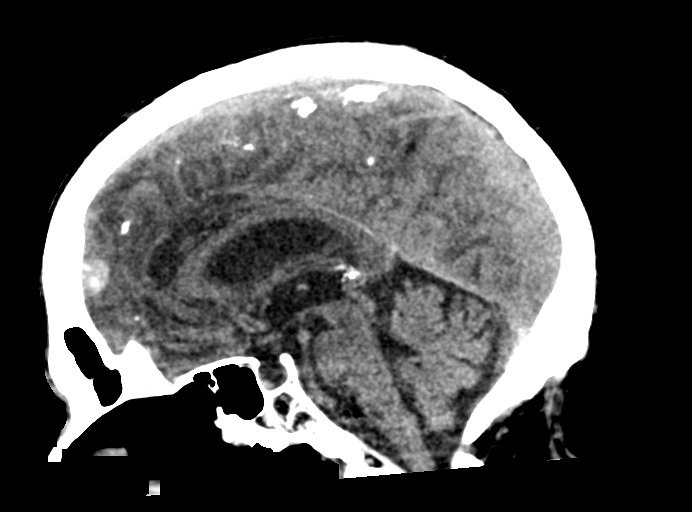
[im 35/56  brain]
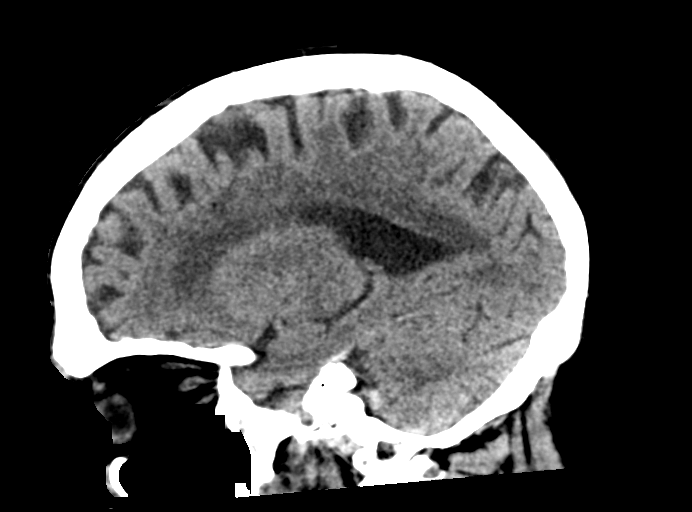

[15 of 47 positions shown; findings below may reference images not displayed]

FINDINGS: Brain: Moderate low density in the periventricular white matter
likely related to small vessel disease. No hemorrhage,
hydrocephalus, acute infarct, intra-axial, or extra-axial fluid
collection. Meningioma along the left anterior falx measures 9 mm on
image 16/series 2. No significant mass effect.

Vascular: Bilateral intracranial carotid and left vertebral artery
atherosclerosis.

Skull: No significant soft tissue swelling.  No skull fracture.

Sinuses/Orbits: Normal orbits and globes. Clear paranasal sinuses
and mastoid air cells.

Other: None
IMPRESSION: 1. No acute or posttraumatic deformity identified
2. Moderate small vessel ischemic change.
3. 9 mm meningioma along the anterior falx, without significant mass
effect or surrounding cerebral edema.

## 2017-06-14 IMAGING — DX DG CHEST 1V
1 series · 1 of 1 positions shown · non-contrast
Comparison: 07/05/2016

CLINICAL DATA: Hypoxia, shortness of breath

EXAM:
CHEST 1 VIEW

[chest ap]
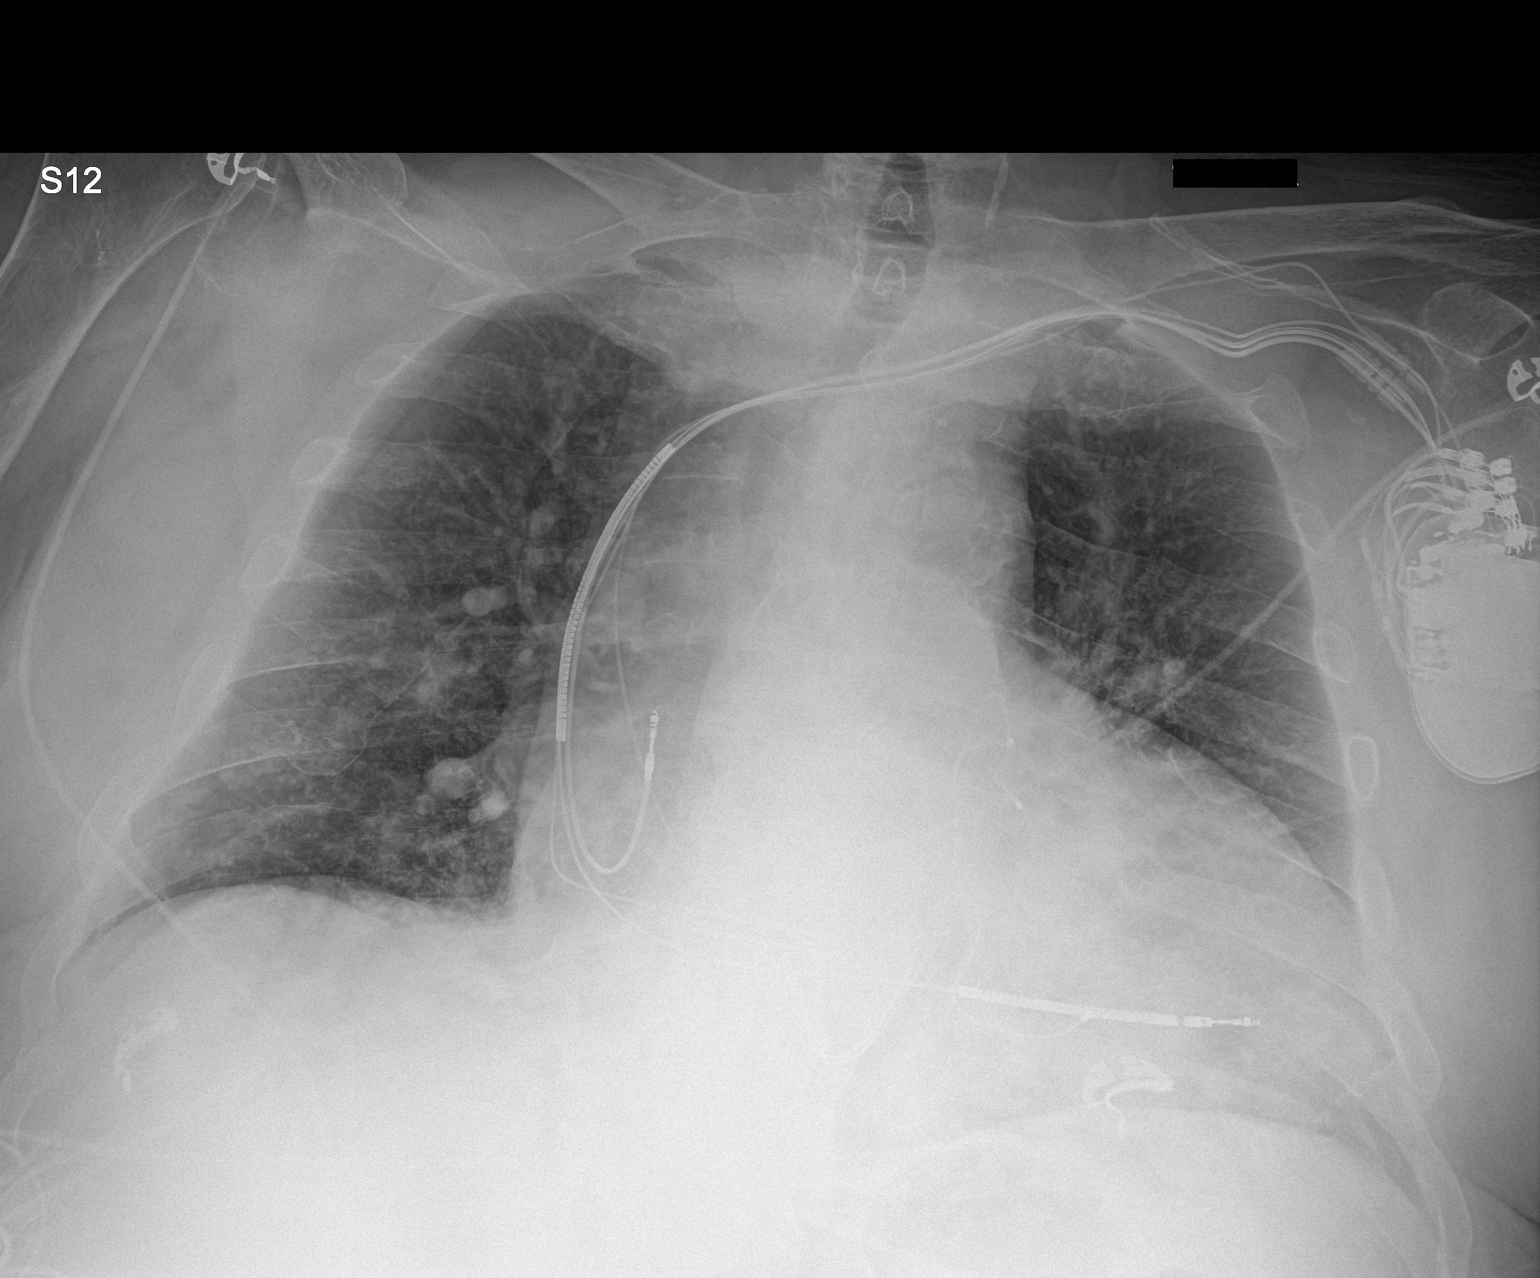

[1 of 1 positions shown; findings below may reference images not displayed]

FINDINGS: There is bilateral interstitial thickening and prominence of the
central pulmonary vasculature. There is no focal parenchymal
opacity. There is no pleural effusion or pneumothorax. There is
stable cardiomegaly. There is a 3 lead AICD.

The osseous structures are unremarkable.
IMPRESSION: Cardiomegaly with pulmonary vascular congestion.

## 2017-07-14 IMAGING — CR DG CHEST 2V
2 series · 2 of 2 positions shown · non-contrast
Comparison: July 13, 2016

CLINICAL DATA: Status post hip repair.  Shortness of breath.

EXAM:
CHEST  2 VIEW

[chest lat]
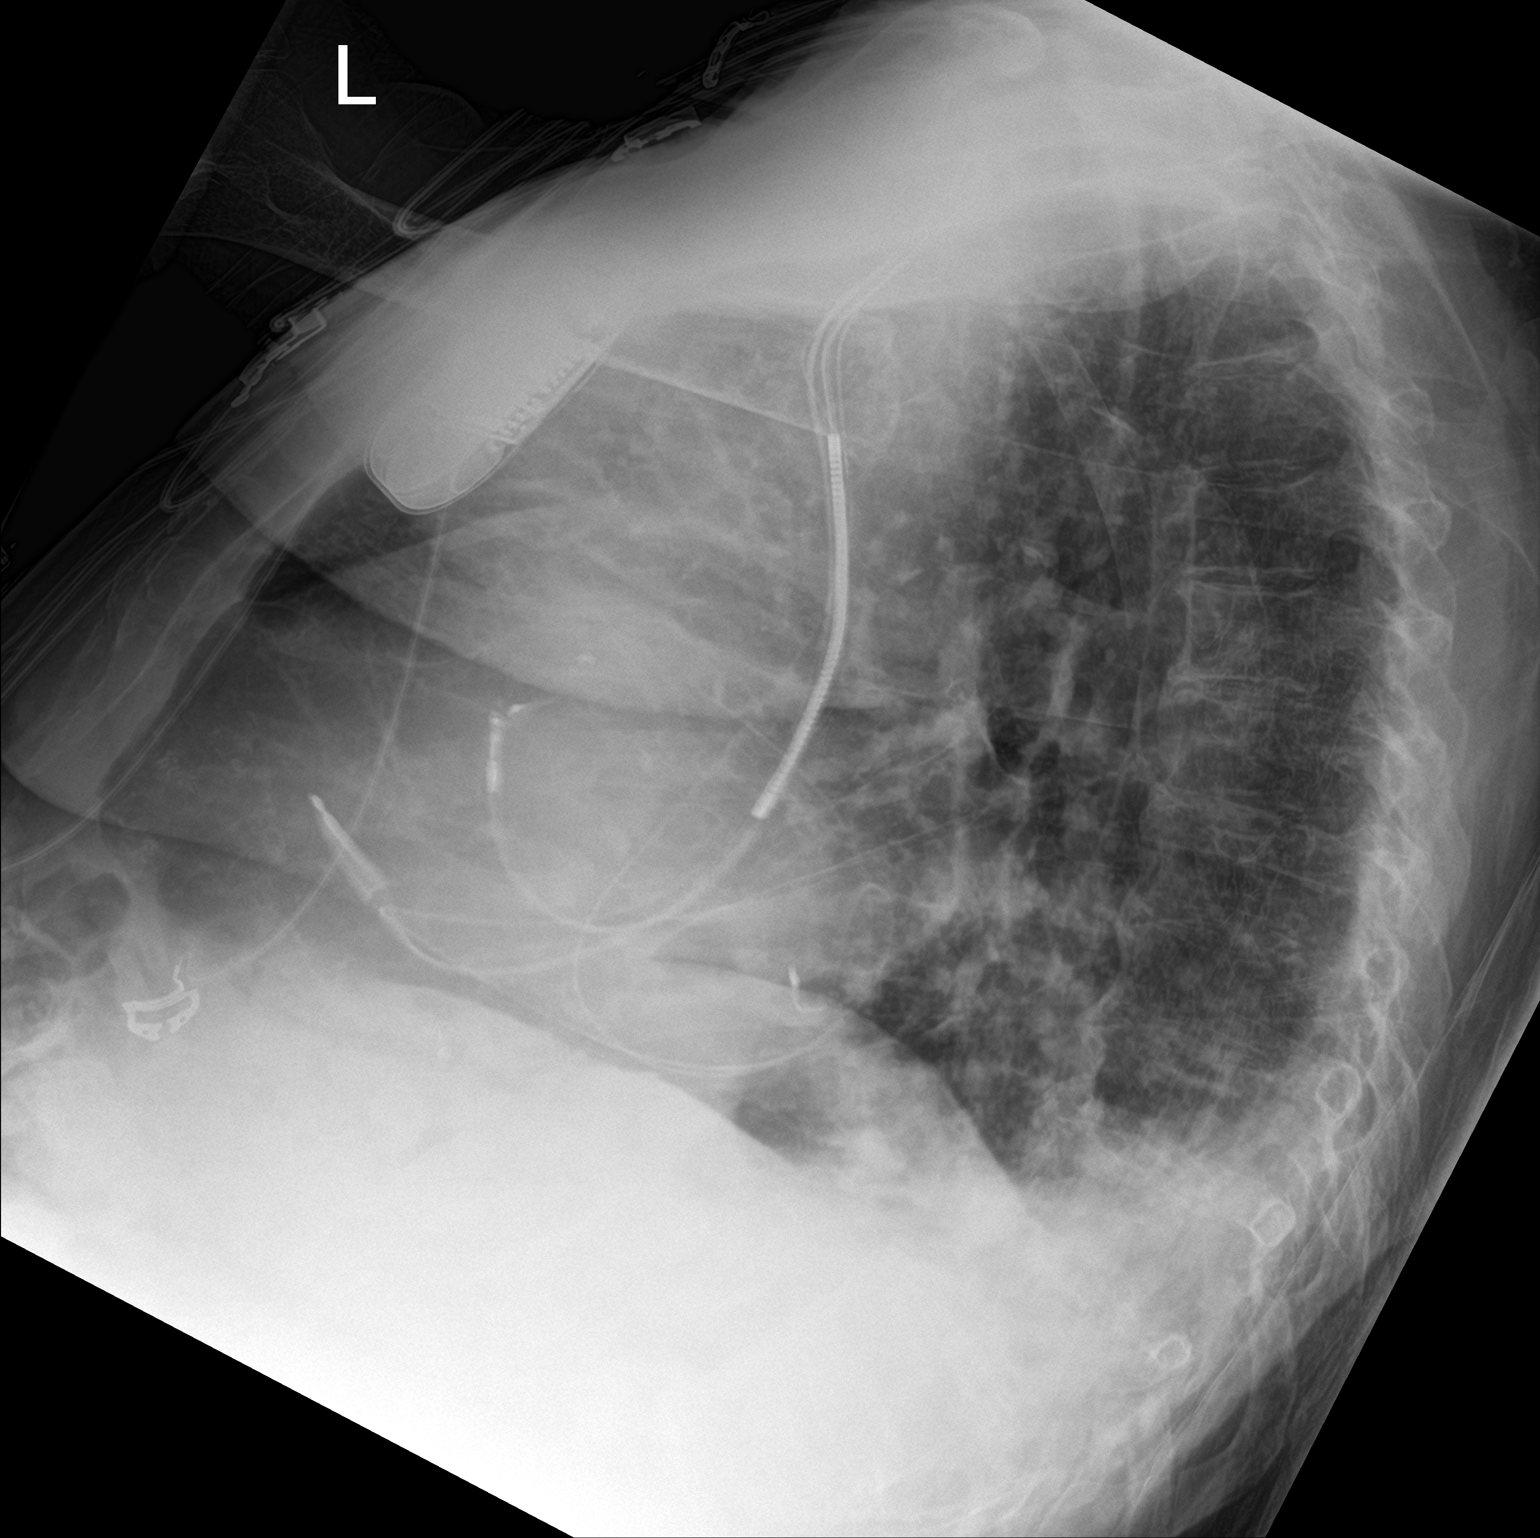

[chest ap]
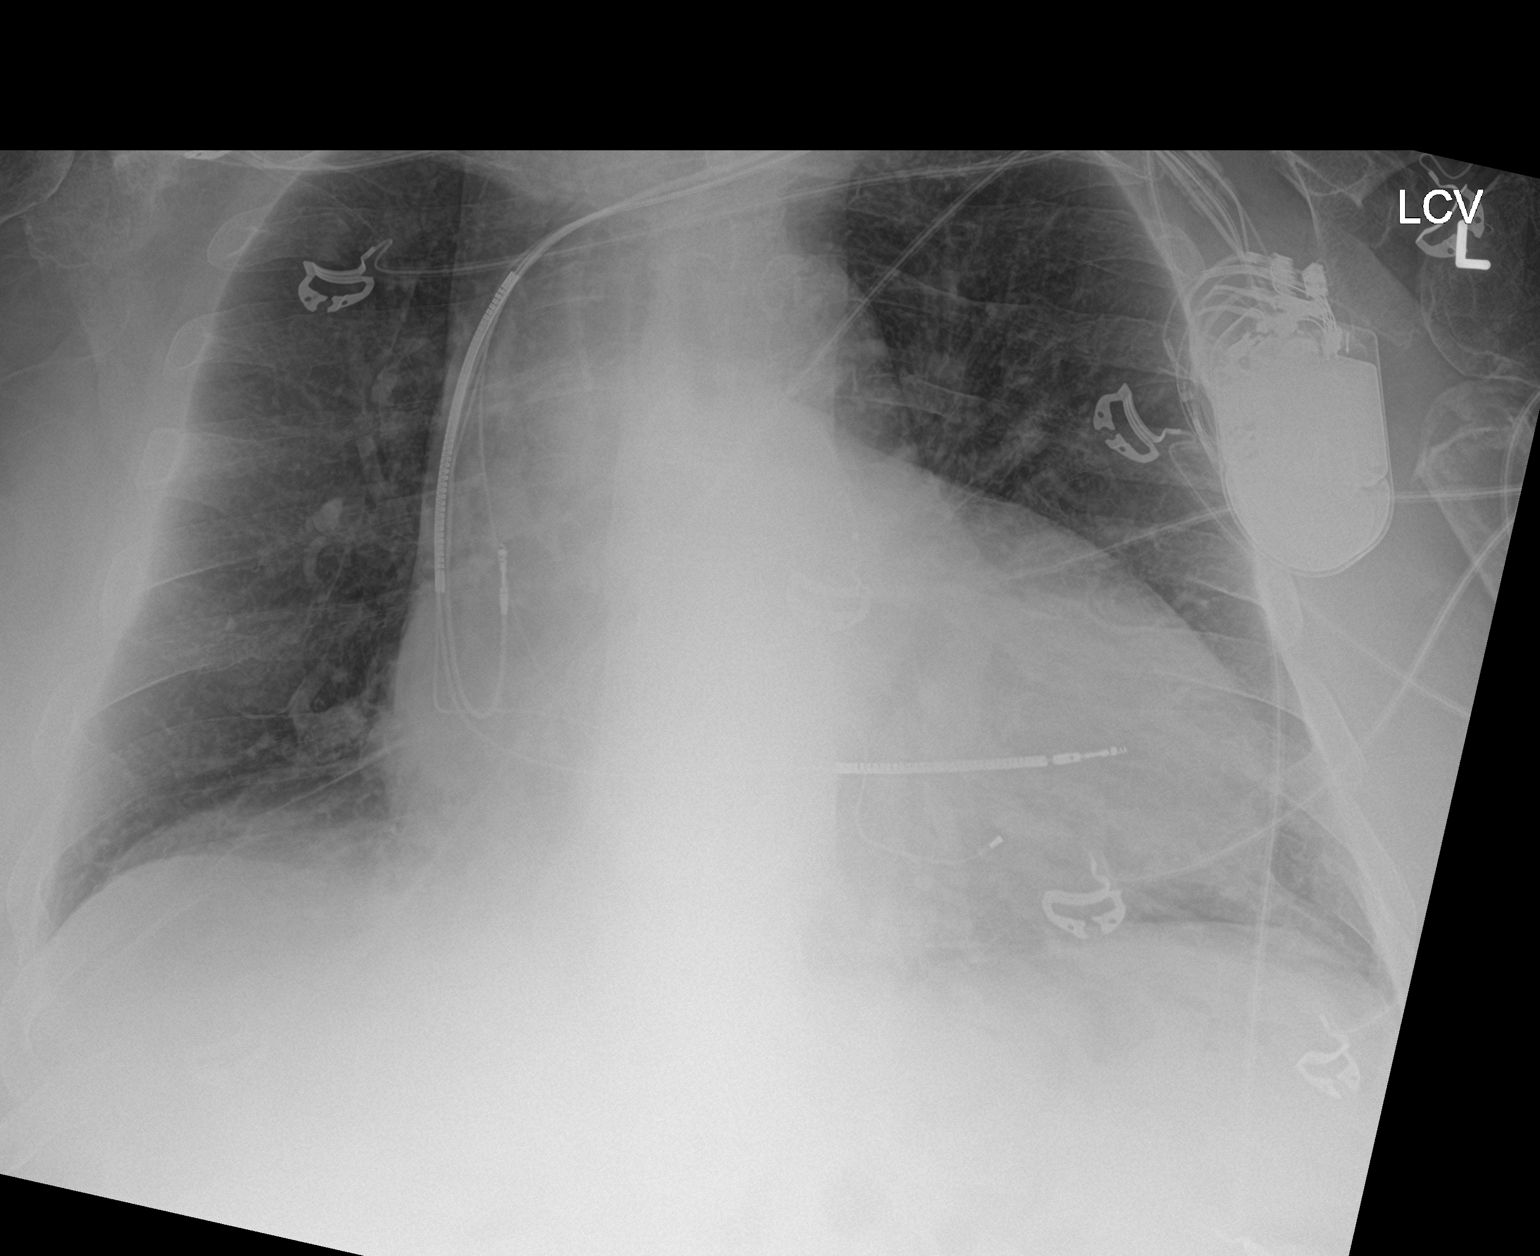

[2 of 2 positions shown; findings below may reference images not displayed]

FINDINGS: Stable AICD device. No pneumothorax. Cardiomegaly. Opacity seen
posteriorly on the lateral view.
IMPRESSION: Opacity seen in the lungs on only the lateral view. Pneumonia or
aspiration not excluded. Recommend follow-up to resolution.

## 2017-09-27 ENCOUNTER — Ambulatory Visit: Payer: Self-pay | Admitting: General Practice
